# Patient Record
Sex: Female | Born: 1970 | Race: Black or African American | Hispanic: No | Marital: Married | Smoking: Never smoker
Health system: Southern US, Community
[De-identification: ages and names within clinical notes are randomized; demographics above are authoritative.]

## PROBLEM LIST (undated history)

## (undated) DIAGNOSIS — I10 Essential (primary) hypertension: Secondary | ICD-10-CM

## (undated) DIAGNOSIS — T783XXA Angioneurotic edema, initial encounter: Secondary | ICD-10-CM

## (undated) DIAGNOSIS — M545 Low back pain, unspecified: Secondary | ICD-10-CM

## (undated) DIAGNOSIS — L509 Urticaria, unspecified: Secondary | ICD-10-CM

## (undated) DIAGNOSIS — M1990 Unspecified osteoarthritis, unspecified site: Secondary | ICD-10-CM

## (undated) DIAGNOSIS — G5600 Carpal tunnel syndrome, unspecified upper limb: Secondary | ICD-10-CM

## (undated) DIAGNOSIS — Z87440 Personal history of urinary (tract) infections: Secondary | ICD-10-CM

## (undated) DIAGNOSIS — K219 Gastro-esophageal reflux disease without esophagitis: Secondary | ICD-10-CM

## (undated) DIAGNOSIS — E669 Obesity, unspecified: Secondary | ICD-10-CM

## (undated) HISTORY — DX: Obesity, unspecified: E66.9

## (undated) HISTORY — PX: CHOLECYSTECTOMY: SHX55

## (undated) HISTORY — DX: Unspecified osteoarthritis, unspecified site: M19.90

## (undated) HISTORY — DX: Low back pain, unspecified: M54.50

## (undated) HISTORY — DX: Essential (primary) hypertension: I10

## (undated) HISTORY — PX: ANTERIOR CRUCIATE LIGAMENT REPAIR: SHX115

## (undated) HISTORY — DX: Gastro-esophageal reflux disease without esophagitis: K21.9

## (undated) HISTORY — DX: Personal history of urinary (tract) infections: Z87.440

## (undated) HISTORY — DX: Urticaria, unspecified: L50.9

## (undated) NOTE — Progress Notes (Signed)
 Formatting of this note might be different from the original. Subjective : Patient ID: Leah Phillips is a 82 y.o. female who presents approximately 4 weeks s/p revision of abdominoplasty and brachioplasty scars. The patient is doing well overall, she is pleased with her early post-op result.  History of Present Illness: The patient has been using silicone based scar cream to her incisions and has been doing scar massage. Her scars are flat, she denies any scar hypertrophy or widening. She notes improvement of her arm and abdominal contour.  Objective : Physical Exam Bilateral arm incisions and abdominal incision healing well, good contour, no open areas, no scar hypertrophy.  Assessment/Plan : Leah Phillips is a 79 y.o. female who presents approximately 4 weeks s/p revision of abdominoplasty and brachioplasty scars. The patient is doing well overall, she is pleased with her early post-op result.  Continue scar massage Continue silicone based scar cream to incisions 1-2x daily Electronically signed by Dzifa Kpodzo, MD MPH at 01/04/2024 11:19 PM EDT

---
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
## 2003-09-13 ENCOUNTER — Ambulatory Visit (HOSPITAL_COMMUNITY): Admission: RE | Admit: 2003-09-13 | Discharge: 2003-09-13 | Payer: Self-pay | Admitting: Family Medicine

## 2003-09-19 ENCOUNTER — Ambulatory Visit (HOSPITAL_COMMUNITY): Admission: RE | Admit: 2003-09-19 | Discharge: 2003-09-19 | Payer: Self-pay | Admitting: Family Medicine

## 2003-09-20 ENCOUNTER — Ambulatory Visit (HOSPITAL_COMMUNITY): Admission: RE | Admit: 2003-09-20 | Discharge: 2003-09-20 | Payer: Self-pay | Admitting: Family Medicine

## 2003-09-27 ENCOUNTER — Encounter: Payer: Self-pay | Admitting: Orthopedic Surgery

## 2003-10-02 ENCOUNTER — Encounter (HOSPITAL_COMMUNITY): Admission: RE | Admit: 2003-10-02 | Discharge: 2003-11-01 | Payer: Self-pay | Admitting: Orthopedic Surgery

## 2003-11-08 ENCOUNTER — Encounter: Admission: RE | Admit: 2003-11-08 | Discharge: 2003-11-08 | Payer: Self-pay | Admitting: *Deleted

## 2004-10-20 ENCOUNTER — Ambulatory Visit: Payer: Self-pay | Admitting: Family Medicine

## 2004-12-12 ENCOUNTER — Ambulatory Visit: Payer: Self-pay | Admitting: Family Medicine

## 2005-01-29 ENCOUNTER — Ambulatory Visit: Payer: Self-pay | Admitting: Family Medicine

## 2005-02-02 ENCOUNTER — Ambulatory Visit (HOSPITAL_COMMUNITY): Admission: RE | Admit: 2005-02-02 | Discharge: 2005-02-02 | Payer: Self-pay | Admitting: Family Medicine

## 2005-02-15 ENCOUNTER — Emergency Department (HOSPITAL_COMMUNITY): Admission: EM | Admit: 2005-02-15 | Discharge: 2005-02-16 | Payer: Self-pay | Admitting: *Deleted

## 2005-02-23 ENCOUNTER — Ambulatory Visit: Payer: Self-pay | Admitting: Family Medicine

## 2005-02-27 ENCOUNTER — Ambulatory Visit (HOSPITAL_COMMUNITY): Admission: RE | Admit: 2005-02-27 | Discharge: 2005-02-27 | Payer: Self-pay | Admitting: Family Medicine

## 2005-03-03 ENCOUNTER — Encounter (HOSPITAL_COMMUNITY): Admission: RE | Admit: 2005-03-03 | Discharge: 2005-03-03 | Payer: Self-pay | Admitting: Family Medicine

## 2005-03-31 ENCOUNTER — Emergency Department (HOSPITAL_COMMUNITY): Admission: EM | Admit: 2005-03-31 | Discharge: 2005-03-31 | Payer: Self-pay | Admitting: Emergency Medicine

## 2005-04-07 ENCOUNTER — Encounter (INDEPENDENT_AMBULATORY_CARE_PROVIDER_SITE_OTHER): Payer: Self-pay | Admitting: General Surgery

## 2005-04-07 ENCOUNTER — Observation Stay (HOSPITAL_COMMUNITY): Admission: RE | Admit: 2005-04-07 | Discharge: 2005-04-08 | Payer: Self-pay | Admitting: General Surgery

## 2005-05-19 ENCOUNTER — Ambulatory Visit: Payer: Self-pay | Admitting: Family Medicine

## 2005-09-17 ENCOUNTER — Ambulatory Visit: Payer: Self-pay | Admitting: Family Medicine

## 2005-10-19 ENCOUNTER — Encounter: Admission: RE | Admit: 2005-10-19 | Discharge: 2005-10-19 | Payer: Self-pay | Admitting: Family Medicine

## 2005-11-05 ENCOUNTER — Encounter: Admission: RE | Admit: 2005-11-05 | Discharge: 2005-11-05 | Payer: Self-pay | Admitting: Family Medicine

## 2006-03-01 ENCOUNTER — Ambulatory Visit: Payer: Self-pay | Admitting: Family Medicine

## 2006-03-02 ENCOUNTER — Ambulatory Visit (HOSPITAL_COMMUNITY): Admission: RE | Admit: 2006-03-02 | Discharge: 2006-03-02 | Payer: Self-pay | Admitting: Family Medicine

## 2006-04-16 ENCOUNTER — Ambulatory Visit: Payer: Self-pay | Admitting: Family Medicine

## 2006-07-01 ENCOUNTER — Ambulatory Visit: Payer: Self-pay | Admitting: Family Medicine

## 2006-07-02 ENCOUNTER — Encounter: Payer: Self-pay | Admitting: Family Medicine

## 2006-07-02 LAB — CONVERTED CEMR LAB
Candida species: NEGATIVE
Gardnerella vaginalis: NEGATIVE

## 2006-08-30 ENCOUNTER — Encounter: Payer: Self-pay | Admitting: Family Medicine

## 2006-08-30 LAB — CONVERTED CEMR LAB
Cholesterol: 158 mg/dL (ref 0–200)
Total CHOL/HDL Ratio: 4.6
Triglycerides: 211 mg/dL — ABNORMAL HIGH (ref <150)

## 2006-10-01 ENCOUNTER — Ambulatory Visit: Payer: Self-pay | Admitting: Family Medicine

## 2006-11-19 ENCOUNTER — Ambulatory Visit: Payer: Self-pay | Admitting: Family Medicine

## 2006-11-20 ENCOUNTER — Encounter: Payer: Self-pay | Admitting: Family Medicine

## 2006-11-20 LAB — CONVERTED CEMR LAB
Chlamydia, DNA Probe: NEGATIVE
GC Probe Amp, Genital: NEGATIVE
Gardnerella vaginalis: NEGATIVE
Trichomonal Vaginitis: NEGATIVE

## 2007-01-20 ENCOUNTER — Emergency Department (HOSPITAL_COMMUNITY): Admission: EM | Admit: 2007-01-20 | Discharge: 2007-01-20 | Payer: Self-pay | Admitting: Emergency Medicine

## 2007-01-26 ENCOUNTER — Ambulatory Visit: Payer: Self-pay | Admitting: Orthopedic Surgery

## 2007-01-31 ENCOUNTER — Ambulatory Visit: Payer: Self-pay | Admitting: Family Medicine

## 2007-02-02 ENCOUNTER — Encounter: Payer: Self-pay | Admitting: Family Medicine

## 2007-02-11 ENCOUNTER — Ambulatory Visit: Payer: Self-pay | Admitting: Family Medicine

## 2007-03-23 ENCOUNTER — Ambulatory Visit: Payer: Self-pay | Admitting: Family Medicine

## 2007-03-23 LAB — CONVERTED CEMR LAB
BUN: 10 mg/dL (ref 6–23)
Basophils Relative: 0 % (ref 0–1)
Chloride: 111 meq/L (ref 96–112)
Cholesterol: 188 mg/dL (ref 0–200)
Creatinine, Ser: 0.75 mg/dL (ref 0.40–1.20)
Eosinophils Absolute: 0 10*3/uL (ref 0.0–0.7)
Eosinophils Relative: 1 % (ref 0–5)
HCT: 42.2 % (ref 36.0–46.0)
Hemoglobin: 14.3 g/dL (ref 12.0–15.0)
LDL Cholesterol: 123 mg/dL — ABNORMAL HIGH (ref 0–99)
MCHC: 33.9 g/dL (ref 30.0–36.0)
MCV: 88.7 fL (ref 78.0–100.0)
Monocytes Absolute: 0.4 10*3/uL (ref 0.2–0.7)
Monocytes Relative: 6 % (ref 3–11)
RBC: 4.76 M/uL (ref 3.87–5.11)
Triglycerides: 103 mg/dL (ref <150)

## 2007-04-08 ENCOUNTER — Encounter: Payer: Self-pay | Admitting: Orthopedic Surgery

## 2007-04-21 ENCOUNTER — Ambulatory Visit: Payer: Self-pay | Admitting: Orthopedic Surgery

## 2007-04-26 ENCOUNTER — Encounter: Payer: Self-pay | Admitting: Orthopedic Surgery

## 2007-05-02 ENCOUNTER — Encounter: Payer: Self-pay | Admitting: Orthopedic Surgery

## 2007-05-16 ENCOUNTER — Encounter: Payer: Self-pay | Admitting: Family Medicine

## 2007-05-16 ENCOUNTER — Other Ambulatory Visit: Admission: RE | Admit: 2007-05-16 | Discharge: 2007-05-16 | Payer: Self-pay | Admitting: Family Medicine

## 2007-05-16 ENCOUNTER — Ambulatory Visit: Payer: Self-pay | Admitting: Family Medicine

## 2007-05-17 ENCOUNTER — Encounter: Payer: Self-pay | Admitting: Family Medicine

## 2007-05-17 LAB — CONVERTED CEMR LAB
Gardnerella vaginalis: NEGATIVE
Trichomonal Vaginitis: NEGATIVE

## 2007-06-09 ENCOUNTER — Encounter: Payer: Self-pay | Admitting: Family Medicine

## 2007-06-15 ENCOUNTER — Ambulatory Visit: Payer: Self-pay | Admitting: Family Medicine

## 2007-06-16 ENCOUNTER — Encounter: Payer: Self-pay | Admitting: Family Medicine

## 2007-06-16 LAB — CONVERTED CEMR LAB
Candida species: NEGATIVE
GC Probe Amp, Genital: NEGATIVE

## 2007-06-23 ENCOUNTER — Ambulatory Visit: Payer: Self-pay | Admitting: Orthopedic Surgery

## 2007-06-23 DIAGNOSIS — M238X9 Other internal derangements of unspecified knee: Secondary | ICD-10-CM

## 2007-06-23 DIAGNOSIS — S83509A Sprain of unspecified cruciate ligament of unspecified knee, initial encounter: Secondary | ICD-10-CM | POA: Insufficient documentation

## 2007-07-01 ENCOUNTER — Ambulatory Visit: Payer: Self-pay | Admitting: Orthopedic Surgery

## 2007-07-01 ENCOUNTER — Ambulatory Visit (HOSPITAL_COMMUNITY): Admission: RE | Admit: 2007-07-01 | Discharge: 2007-07-01 | Payer: Self-pay | Admitting: Orthopedic Surgery

## 2007-07-02 ENCOUNTER — Emergency Department (HOSPITAL_COMMUNITY): Admission: EM | Admit: 2007-07-02 | Discharge: 2007-07-02 | Payer: Self-pay | Admitting: Emergency Medicine

## 2007-07-06 ENCOUNTER — Ambulatory Visit: Payer: Self-pay | Admitting: Orthopedic Surgery

## 2007-07-08 ENCOUNTER — Encounter: Payer: Self-pay | Admitting: Orthopedic Surgery

## 2007-07-11 ENCOUNTER — Encounter: Payer: Self-pay | Admitting: Orthopedic Surgery

## 2007-07-13 ENCOUNTER — Ambulatory Visit: Payer: Self-pay | Admitting: Orthopedic Surgery

## 2007-08-01 ENCOUNTER — Ambulatory Visit: Payer: Self-pay | Admitting: Family Medicine

## 2007-08-10 ENCOUNTER — Ambulatory Visit: Payer: Self-pay | Admitting: Orthopedic Surgery

## 2007-08-17 ENCOUNTER — Encounter: Payer: Self-pay | Admitting: Orthopedic Surgery

## 2007-09-07 ENCOUNTER — Ambulatory Visit: Payer: Self-pay | Admitting: Family Medicine

## 2007-09-19 ENCOUNTER — Encounter: Payer: Self-pay | Admitting: Orthopedic Surgery

## 2007-09-20 ENCOUNTER — Ambulatory Visit: Payer: Self-pay | Admitting: Orthopedic Surgery

## 2007-10-12 ENCOUNTER — Emergency Department (HOSPITAL_COMMUNITY): Admission: EM | Admit: 2007-10-12 | Discharge: 2007-10-12 | Payer: Self-pay | Admitting: Emergency Medicine

## 2007-10-14 ENCOUNTER — Encounter: Payer: Self-pay | Admitting: Orthopedic Surgery

## 2007-10-18 ENCOUNTER — Ambulatory Visit: Payer: Self-pay | Admitting: Family Medicine

## 2007-10-19 ENCOUNTER — Encounter: Payer: Self-pay | Admitting: Family Medicine

## 2007-10-20 ENCOUNTER — Encounter: Payer: Self-pay | Admitting: Family Medicine

## 2007-10-20 LAB — CONVERTED CEMR LAB
Candida species: NEGATIVE
Trichomonal Vaginitis: NEGATIVE

## 2007-11-11 DIAGNOSIS — J301 Allergic rhinitis due to pollen: Secondary | ICD-10-CM

## 2007-11-11 DIAGNOSIS — E669 Obesity, unspecified: Secondary | ICD-10-CM | POA: Insufficient documentation

## 2007-11-30 ENCOUNTER — Ambulatory Visit: Payer: Self-pay | Admitting: Family Medicine

## 2008-01-04 ENCOUNTER — Encounter: Payer: Self-pay | Admitting: Family Medicine

## 2008-01-04 ENCOUNTER — Ambulatory Visit: Payer: Self-pay | Admitting: Family Medicine

## 2008-01-04 DIAGNOSIS — N949 Unspecified condition associated with female genital organs and menstrual cycle: Secondary | ICD-10-CM

## 2008-01-04 DIAGNOSIS — N925 Other specified irregular menstruation: Secondary | ICD-10-CM | POA: Insufficient documentation

## 2008-01-04 DIAGNOSIS — N938 Other specified abnormal uterine and vaginal bleeding: Secondary | ICD-10-CM | POA: Insufficient documentation

## 2008-01-04 DIAGNOSIS — N760 Acute vaginitis: Secondary | ICD-10-CM | POA: Insufficient documentation

## 2008-01-05 ENCOUNTER — Encounter: Payer: Self-pay | Admitting: Family Medicine

## 2008-01-05 LAB — CONVERTED CEMR LAB: Trichomonal Vaginitis: NEGATIVE

## 2008-01-09 ENCOUNTER — Telehealth: Payer: Self-pay | Admitting: Family Medicine

## 2008-01-19 ENCOUNTER — Telehealth: Payer: Self-pay | Admitting: Family Medicine

## 2008-02-17 ENCOUNTER — Telehealth: Payer: Self-pay | Admitting: Family Medicine

## 2008-02-22 ENCOUNTER — Ambulatory Visit: Payer: Self-pay | Admitting: Family Medicine

## 2008-05-15 ENCOUNTER — Ambulatory Visit: Payer: Self-pay | Admitting: Family Medicine

## 2008-05-31 ENCOUNTER — Emergency Department (HOSPITAL_COMMUNITY): Admission: EM | Admit: 2008-05-31 | Discharge: 2008-05-31 | Payer: Self-pay | Admitting: Emergency Medicine

## 2008-06-11 ENCOUNTER — Telehealth: Payer: Self-pay | Admitting: Family Medicine

## 2008-07-24 ENCOUNTER — Ambulatory Visit: Payer: Self-pay | Admitting: Family Medicine

## 2008-07-25 ENCOUNTER — Encounter: Payer: Self-pay | Admitting: Family Medicine

## 2008-07-25 LAB — CONVERTED CEMR LAB
Candida species: NEGATIVE
Chlamydia, DNA Probe: NEGATIVE
GC Probe Amp, Genital: NEGATIVE
Gardnerella vaginalis: POSITIVE — AB
Trichomonal Vaginitis: NEGATIVE

## 2008-07-29 DIAGNOSIS — L738 Other specified follicular disorders: Secondary | ICD-10-CM | POA: Insufficient documentation

## 2008-09-21 ENCOUNTER — Emergency Department (HOSPITAL_COMMUNITY): Admission: EM | Admit: 2008-09-21 | Discharge: 2008-09-21 | Payer: Self-pay | Admitting: Emergency Medicine

## 2008-09-27 ENCOUNTER — Ambulatory Visit: Payer: Self-pay | Admitting: Family Medicine

## 2008-09-27 DIAGNOSIS — M25569 Pain in unspecified knee: Secondary | ICD-10-CM | POA: Insufficient documentation

## 2008-10-02 ENCOUNTER — Telehealth: Payer: Self-pay | Admitting: Family Medicine

## 2008-10-02 DIAGNOSIS — I10 Essential (primary) hypertension: Secondary | ICD-10-CM | POA: Insufficient documentation

## 2008-11-29 ENCOUNTER — Ambulatory Visit: Payer: Self-pay | Admitting: Family Medicine

## 2008-11-29 ENCOUNTER — Other Ambulatory Visit: Admission: RE | Admit: 2008-11-29 | Discharge: 2008-11-29 | Payer: Self-pay | Admitting: Family Medicine

## 2008-11-29 ENCOUNTER — Encounter: Payer: Self-pay | Admitting: Family Medicine

## 2008-11-29 LAB — CONVERTED CEMR LAB
Basophils Absolute: 0 10*3/uL (ref 0.0–0.1)
Calcium: 9.2 mg/dL (ref 8.4–10.5)
Cholesterol: 189 mg/dL (ref 0–200)
Hemoglobin: 14.2 g/dL (ref 12.0–15.0)
Lymphocytes Relative: 38 % (ref 12–46)
Neutro Abs: 4.1 10*3/uL (ref 1.7–7.7)
Neutrophils Relative %: 55 % (ref 43–77)
Platelets: 326 10*3/uL (ref 150–400)
Potassium: 3.6 meq/L (ref 3.5–5.3)
RDW: 12.9 % (ref 11.5–15.5)
Sodium: 143 meq/L (ref 135–145)
Total CHOL/HDL Ratio: 4.4
Triglycerides: 76 mg/dL (ref <150)
VLDL: 15 mg/dL (ref 0–40)

## 2008-11-30 ENCOUNTER — Encounter: Payer: Self-pay | Admitting: Family Medicine

## 2008-11-30 LAB — CONVERTED CEMR LAB
Gardnerella vaginalis: POSITIVE — AB
Trichomonal Vaginitis: NEGATIVE

## 2008-12-03 ENCOUNTER — Encounter: Payer: Self-pay | Admitting: Family Medicine

## 2008-12-03 LAB — CONVERTED CEMR LAB: GC Probe Amp, Genital: NEGATIVE

## 2009-02-07 ENCOUNTER — Telehealth: Payer: Self-pay | Admitting: Family Medicine

## 2009-02-21 ENCOUNTER — Ambulatory Visit: Payer: Self-pay | Admitting: Family Medicine

## 2009-02-21 DIAGNOSIS — M549 Dorsalgia, unspecified: Secondary | ICD-10-CM | POA: Insufficient documentation

## 2009-02-26 ENCOUNTER — Telehealth: Payer: Self-pay | Admitting: Family Medicine

## 2009-03-27 ENCOUNTER — Telehealth: Payer: Self-pay | Admitting: Family Medicine

## 2009-04-14 ENCOUNTER — Emergency Department (HOSPITAL_COMMUNITY): Admission: EM | Admit: 2009-04-14 | Discharge: 2009-04-14 | Payer: Self-pay | Admitting: Emergency Medicine

## 2009-04-18 ENCOUNTER — Encounter: Payer: Self-pay | Admitting: Family Medicine

## 2009-04-18 ENCOUNTER — Ambulatory Visit: Payer: Self-pay | Admitting: Cardiology

## 2009-04-19 ENCOUNTER — Encounter: Payer: Self-pay | Admitting: Family Medicine

## 2009-05-06 ENCOUNTER — Ambulatory Visit: Payer: Self-pay | Admitting: Family Medicine

## 2009-05-06 DIAGNOSIS — M542 Cervicalgia: Secondary | ICD-10-CM

## 2009-05-07 ENCOUNTER — Telehealth: Payer: Self-pay | Admitting: Family Medicine

## 2009-05-08 ENCOUNTER — Encounter: Payer: Self-pay | Admitting: Family Medicine

## 2009-05-28 ENCOUNTER — Telehealth: Payer: Self-pay | Admitting: Family Medicine

## 2009-06-10 ENCOUNTER — Telehealth: Payer: Self-pay | Admitting: Family Medicine

## 2009-06-11 ENCOUNTER — Ambulatory Visit (HOSPITAL_COMMUNITY): Admission: RE | Admit: 2009-06-11 | Discharge: 2009-06-11 | Payer: Self-pay | Admitting: Family Medicine

## 2009-06-12 ENCOUNTER — Telehealth: Payer: Self-pay | Admitting: Family Medicine

## 2009-06-17 ENCOUNTER — Telehealth: Payer: Self-pay | Admitting: Family Medicine

## 2009-06-20 ENCOUNTER — Ambulatory Visit: Payer: Self-pay | Admitting: Family Medicine

## 2009-06-21 DIAGNOSIS — M25519 Pain in unspecified shoulder: Secondary | ICD-10-CM | POA: Insufficient documentation

## 2009-06-25 ENCOUNTER — Telehealth: Payer: Self-pay | Admitting: Family Medicine

## 2009-07-10 ENCOUNTER — Telehealth: Payer: Self-pay | Admitting: Family Medicine

## 2009-07-23 ENCOUNTER — Ambulatory Visit: Payer: Self-pay | Admitting: Family Medicine

## 2009-07-23 DIAGNOSIS — R5381 Other malaise: Secondary | ICD-10-CM | POA: Insufficient documentation

## 2009-07-23 DIAGNOSIS — R5383 Other fatigue: Secondary | ICD-10-CM

## 2009-08-19 ENCOUNTER — Telehealth: Payer: Self-pay | Admitting: Family Medicine

## 2009-09-20 ENCOUNTER — Encounter: Payer: Self-pay | Admitting: Family Medicine

## 2009-09-24 LAB — CONVERTED CEMR LAB
CO2: 22 meq/L (ref 19–32)
Calcium: 9.2 mg/dL (ref 8.4–10.5)
Chloride: 106 meq/L (ref 96–112)
HDL: 42 mg/dL (ref >39)
Sodium: 141 meq/L (ref 135–145)
Total CHOL/HDL Ratio: 5
Triglycerides: 138 mg/dL (ref <150)

## 2009-09-26 ENCOUNTER — Ambulatory Visit: Payer: Self-pay | Admitting: Family Medicine

## 2009-10-07 ENCOUNTER — Telehealth: Payer: Self-pay | Admitting: Family Medicine

## 2009-10-15 ENCOUNTER — Ambulatory Visit: Payer: Self-pay | Admitting: Family Medicine

## 2009-10-15 DIAGNOSIS — L299 Pruritus, unspecified: Secondary | ICD-10-CM | POA: Insufficient documentation

## 2009-10-18 ENCOUNTER — Telehealth: Payer: Self-pay | Admitting: Family Medicine

## 2009-10-18 ENCOUNTER — Telehealth: Payer: Self-pay | Admitting: Physician Assistant

## 2009-10-31 ENCOUNTER — Encounter: Payer: Self-pay | Admitting: Family Medicine

## 2009-11-06 ENCOUNTER — Encounter: Payer: Self-pay | Admitting: Family Medicine

## 2009-11-11 ENCOUNTER — Telehealth: Payer: Self-pay | Admitting: Family Medicine

## 2009-11-12 ENCOUNTER — Encounter: Payer: Self-pay | Admitting: Family Medicine

## 2009-11-26 ENCOUNTER — Telehealth: Payer: Self-pay | Admitting: Family Medicine

## 2009-11-26 ENCOUNTER — Encounter: Payer: Self-pay | Admitting: Family Medicine

## 2010-01-04 ENCOUNTER — Emergency Department (HOSPITAL_COMMUNITY): Admission: EM | Admit: 2010-01-04 | Discharge: 2010-01-04 | Payer: Self-pay | Admitting: Emergency Medicine

## 2010-06-29 ENCOUNTER — Encounter: Payer: Self-pay | Admitting: Family Medicine

## 2010-06-30 ENCOUNTER — Encounter: Payer: Self-pay | Admitting: Family Medicine

## 2010-07-10 NOTE — Progress Notes (Signed)
Summary: referral  Phone Note Call from Patient   Summary of Call: pt has been scheduled for MRI for t and L and c spine. appt at [facility] for 06/11/2009 11:30. left message for pt to call office if nay questions.  Initial call taken by: Rudene Anda,  June 10, 2009 3:44 PM

## 2010-07-10 NOTE — Letter (Signed)
Summary: history and physical  history and physical   Imported By: Curtis Sites 11/13/2009 13:31:09  _____________________________________________________________________  External Attachment:    Type:   Image     Comment:   External Document

## 2010-07-10 NOTE — Progress Notes (Signed)
Summary: rx  Phone Note Call from Patient   Summary of Call: pharm still doesn't have right medicine [facility] [place] [phone] Initial call taken by: Rudene Anda,  August 19, 2009 9:48 AM  Follow-up for Phone Call        Phone Call Completed, Rx Called In Follow-up by: Adella Hare LPN,  August 19, 2009 10:24 AM

## 2010-07-10 NOTE — Letter (Signed)
Summary: TO PHARM.  TO PHARM.   Imported By: Lind Guest 11/06/2009 15:33:07  _____________________________________________________________________  External Attachment:    Type:   Image     Comment:   External Document

## 2010-07-10 NOTE — Progress Notes (Signed)
  Phone Note Outgoing Call   Call placed by: dr simpson Summary of Call: msg left for pt tpo call. letter will be mailed in am re her d/c Initial call taken by: Syliva Overman MD,  November 11, 2009 3:52 PM  Follow-up for Phone Call        pls typed/c letter, I will sign once done she has been duplicating pain meds  Follow-up by: Syliva Overman MD,  November 11, 2009 3:52 PM  Additional Follow-up for Phone Call Additional follow up Details #1::        Letter typed and on your bookshelf Additional Follow-up by: Everitt Amber LPN,  November 13, 5619 12:52 PM

## 2010-07-10 NOTE — Progress Notes (Signed)
Summary:  letter  Phone Note Call from Patient   Summary of Call: been out of town 3 wks. she calling about the letter she [facility]  and has not lived in [place] for years  [facility] nd told [facility] ready to pick up she goes to [facility] in [place]. this is a nurses error they should be at fault . please call back at [phone] Initial call taken by: Lind Guest,  November 26, 2009 8:00 AM  Follow-up for Phone Call        i had to lv her a msg to call get me to the phone if she calls pls Follow-up by: Syliva Overman MD,  November 27, 2009 4:55 PM  Additional Follow-up for Phone Call Additional follow up Details #1::        pt hasn't called pre doc  Additional Follow-up by: Rudene Anda,  December 02, 2009 4:55 PM

## 2010-07-10 NOTE — Progress Notes (Signed)
Summary: ITCHY POLLEN  Phone Note Call from Patient   Summary of Call: SHE HAS BEEN ITCHING  BECAUSE OF THE POLLEN  BREAKING OUT IN [place] AND HAS TRIED THE ALL AND ZYTREC AND BEN DOES HELP BUT SHE TAKES 1 A DAY AND IT MAKES HER SLEEPY WANTS SOMETHINF ELSE  [facility] [place] CALL HER BACK AT [phone] TO LET HER KNOW Initial call taken by: Lind Guest,  Oct 07, 2009 9:12 AM  Follow-up for Phone Call        this patient was just here 4/21 Follow-up by: Adella Hare LPN,  Oct 07, 4401 9:15 AM  Additional Follow-up for Phone Call Additional follow up Details #1::        advise and erx prednisone 5mg  dosepack x 5 dayspls Additional Follow-up by: Syliva Overman MD,  Oct 07, 2009 10:00 AM    Additional Follow-up for Phone Call Additional follow up Details #2::    rx sent, patient aware Follow-up by: Adella Hare LPN,  Oct 07, 4740 10:06 AM  New/Updated Medications: PREDNISONE (PAK) 5 MG TABS (PREDNISONE) uad Prescriptions: PREDNISONE (PAK) 5 MG TABS (PREDNISONE) uad  #1 x 0   Entered by:   Adella Hare LPN   Authorized by:   Syliva Overman MD   Signed by:   Adella Hare LPN on 59/56/3875   Method used:   Electronically to        [facility] (ret[facility])       [address]       [place], [place]  [address]       Ph: [phone]       Fax: [phone]   RxID:   [id number]   Appended Document: ITCHY POLLEN The prednisone pak didn't help at all and she is still breaking out in hives and her nose is burning on the inside. Do you want to see her back or call her something else?  Appended Document: ITCHY POLLEN 1) Pt may come in for a nurse visit for Depo Medrol 80mg . 2) She needs to be taking an antihistamine daily.  I saw that she was taking Zyrtec but was making her drowsy.  I would recommend Loratadine or Allegra.  These should not make her drowsy. 3) Rx Rantidine 150 mg two times a day #30 no rf.  Even though this is a stomach pill it helps with  hives & itching. 4) If hives & itching persist will need to make an appt.  May need a referral to an allergist.  Appended Document: ITCHY POLLEN called patient, left message  Appended Document: ITCHY POLLEN    Clinical Lists Changes  Medications: Added new medication of RANITIDINE HCL 150 MG TABS (RANITIDINE HCL) Take 1 tablet by mouth two times a day - Signed Rx of RANITIDINE HCL 150 MG TABS (RANITIDINE HCL) Take 1 tablet by mouth two times a day;  #30 x 0;  Signed;  Entered by: Everitt Amber LPN;  Authorized by: Syliva Overman MD;  Method used: Electronically to [facility] [address] [place], [place], [place]  [address], Ph: [phone], Fax: [phone]    Prescriptions: RANITIDINE HCL 150 MG TABS (RANITIDINE HCL) Take 1 tablet by mouth two times a day  #30 x 0   Entered by:   Everitt Amber LPN   Authorized by:   Syliva Overman MD   Signed by:   Everitt Amber LPN on  10/14/2009   Method used:   Electronically to        [facility] (retail)       [address]       [place], [place]  [address]       Ph: [phone]       Fax: [phone]   RxID:   [id number]    Appended Document: ITCHY POLLEN pt scheduled to come in for depo medrol injection and med sent to [facility]

## 2010-07-10 NOTE — Progress Notes (Signed)
Summary: medicine  Phone Note Call from Patient   Summary of Call: [facility] [place] and leave message  to let her know that it is ready  call at [phone] Initial call taken by: Lind Guest,  July 10, 2009 8:58 AM  Follow-up for Phone Call        returned call, no answer Follow-up by: Worthy Keeler LPN,  July 10, 2009 9:03 AM    Prescriptions: SEASONALE 0.15-0.03 MG TABS (LEVONORGEST-ETH ESTRAD 91-DAY) Take 1 tablet by mouth once a day  #90 x 0   Entered by:   Worthy Keeler LPN   Authorized by:   Syliva Overman MD   Signed by:   Worthy Keeler LPN on 06/16/3233   Method used:   Electronically to        [facility] (retail)       [address]       [place], [place]  [address]       Ph: [phone]       Fax: [phone]   RxID:   [id number]

## 2010-07-10 NOTE — Progress Notes (Signed)
Summary: RX  Phone Note Call from Patient   Summary of Call: DRUG STORE HAS SENT FAX OVER FOR HER PAIN MEDS AND NO ONE HERE HAS RESPONDED [phone] Initial call taken by: Lind Guest,  June 25, 2009 9:58 AM  Follow-up for Phone Call        advised was sent in yesterday, check back with pharmacy Follow-up by: Worthy Keeler LPN,  June 25, 2009 1:58 PM

## 2010-07-10 NOTE — Assessment & Plan Note (Signed)
Summary: office visit   Vital Signs:  Patient profile:   40 year old female Menstrual status:  irregular Height:      63 inches Weight:      221 pounds BMI:     39.29 O2 Sat:      98 % Pulse rate:   79 / minute Pulse rhythm:   regular Resp:     16 per minute BP sitting:   110 / 80 Cuff size:   xl  Vitals Entered By: Everitt Amber (July 23, 2009 3:01 PM)  Nutrition Counseling: Patient's BMI is greater than 25 and therefore counseled on weight management options. CC: Follow up chronic problems Is Patient Diabetic? No   CC:  Follow up chronic problems.  History of Present Illness: Reports  that she has been  doing well. Denies recent fever or chills. Denies sinus pressure, nasal congestion , ear pain or sore throat. Denies chest congestion, or cough productive of sputum. Denies chest pain, palpitations, PND, orthopnea or leg swelling. Denies abdominal pain, nausea, vomitting, diarrhea or constipation. Denies change in bowel movements or bloody stool. Denies dysuria , frequency, incontinence or hesitancy. chronic back and knee pain Denies headaches, vertigo, seizures. Denies depression, anxiety or insomnia.Less stressed and depressed though the problem with her son is not yet resolved. Denies  rash, lesions, or itch. she has lost 10 poounds in the past 6 weeks on phentermine, is excited bout this and wants to continue.[name] also exercising.     Allergies: No Known Drug Allergies  Review of Systems      See HPI Eyes:  Denies blurring and discharge. Endo:  Denies cold intolerance, excessive hunger, excessive thirst, excessive urination, heat intolerance, polyuria, and weight change. Heme:  Denies abnormal bruising and bleeding. Allergy:  Denies hives or rash and itching eyes.  Physical Exam  General:  Well-developed,well-nourished,in no acute distress; alert,appropriate and cooperative throughout examination HEENT: No facial asymmetry,  EOMI, No sinus tenderness,  TM's Clear, oropharynx  pink and moist.   Chest: Clear to auscultation bilaterally.  CVS: S1, S2, No murmurs, No S3.   Abd: Soft, Nontender.  MS: Adequate ROM spine, hips, shoulders and knees.  Ext: No edema.   CNS: CN 2-12 intact, power tone and sensation normal throughout.   Skin: Intact, no visible lesions or rashes.  Psych: Good eye contact, normal affect.  Memory intact, not anxious or depressed appearing.    Impression & Recommendations:  Problem # 1:  BACK PAIN (ICD-724.5) Assessment Unchanged  The following medications were removed from the medication list:    Ibuprofen 800 Mg Tabs (Ibuprofen) .Marland Kitchen... Take 1 tablet by mouth two times a day Her updated medication list for this problem includes:    Hydrocodone-acetaminophen 10-650 Mg Tabs (Hydrocodone-acetaminophen) .Marland Kitchen... Take 1 tablet by mouth two times a day as needed  Problem # 2:  HYPERTENSION (ICD-401.9) Assessment: Unchanged  Her updated medication list for this problem includes:    Maxzide-25 37.5-25 Mg Tabs (Triamterene-hctz) ..... One tab by mouth once daily (takes half tab)  BP today: 110/80 Prior BP: 110/80 (06/20/2009)  Labs Reviewed: K+: 3.6 (11/29/2008) Creat: : 0.83 (11/29/2008)   Chol: 189 (11/29/2008)   HDL: 43 (11/29/2008)   LDL: 131 (11/29/2008)   TG: 76 (11/29/2008)  Problem # 3:  OBESITY, UNSPECIFIED (ICD-278.00) Assessment: Improved  Ht: 63 (07/23/2009)   Wt: 221 (07/23/2009)   BMI: 39.29 (07/23/2009)  Problem # 4:  DYSFUNCTIONAL UTERINE BLEEDING (ICD-626.8) Assessment: Improved  Complete Medication List: 1)  Maxzide-25 37.5-25 Mg Tabs (Triamterene-hctz) .... One tab by mouth once daily 2)  Phentermine Hcl 37.5 Mg Caps (Phentermine hcl) .... Take 1 tablet by mouth once a day 3)  Hydrocodone-acetaminophen 10-650 Mg Tabs (Hydrocodone-acetaminophen) .... Take 1 tablet by mouth two times a day as needed 4)  Omeprazole 20 Mg Cpdr (Omeprazole) .... Take 1 capsule by mouth once a day 5)  Tri-sprintec  0.18/0.215/0.25 Mg-35 Mcg Tabs (Norgestim-eth estrad triphasic) .... Use as directed  Other Orders: T-Basic Metabolic Panel [id number]) T-Lipid Profile [id number])  Patient Instructions: 1)  Please schedule a follow-up appointment in 2 months. 2)  It is important that you exercise regularly at least 20 minutes 5 times a week. If you develop chest pain, have severe difficulty breathing, or feel very tired , stop exercising immediately and seek medical attention. 3)  You need to lose weight. Consider a lower calorie diet and regular exercise.  4)  BMP prior to visit, ICD-9:   fasting in 2 months 5)  Lipid Panel prior to visit, ICD-9: Prescriptions: HYDROCODONE-ACETAMINOPHEN 10-650 MG TABS (HYDROCODONE-ACETAMINOPHEN) Take 1 tablet by mouth two times a day as needed  #60 x 3   Entered by:   Everitt Amber LPN   Authorized by:   Syliva Overman MD   Signed by:   Everitt Amber LPN on 29/56/2130   Method used:   Printed then faxed to ...       [facility] ([facility]       [address]       [place], [place]  [address]       Ph: [phone]       Fax: [phone]   RxID:   [id number] PHENTERMINE HCL 37.5 MG CAPS (PHENTERMINE HCL) Take 1 tablet by mouth once a day  #30 x 1   Entered by:   Everitt Amber LPN   Authorized by:   Syliva Overman MD   Signed by:   Everitt Amber LPN on 63/87/5643   Method used:   Printed then faxed to ...       [facility] ([facility])       [address]       [place], [place]  [address]       Ph: [phone]       Fax: [phone]   RxID:   [id number]

## 2010-07-10 NOTE — Assessment & Plan Note (Signed)
Summary: nurse visit  Nurse Visit   Vital Signs:  Patient profile:   40 year old female Menstrual status:  irregular Height:      63 inches Weight:      224.38 pounds BMI:     39.89 O2 Sat:      98 % Pulse rate:   94 / minute Resp:     16 per minute  Vitals Entered By: Everitt Amber LPN (Oct 15, 2009 11:06 AM) CC: Patient to come in per [facility] for itching for depo medrol 80mg     Allergies: No Known Drug Allergies  Medication Administration  Injection # 1:    Medication: Depo- Medrol 80mg     Diagnosis: PRURITUS (ICD-698.9)    Route: IM    Site: RUOQ gluteus    Exp Date: 06/2010    Lot #: [id number]    Mfr: Pharmacia    Comments: 80mg  given     Patient tolerated injection without complications    Given by: Everitt Amber LPN (Oct 15, 2009 11:16 AM)  Orders Added: 1)  Depo- Medrol 80mg  [J1040] 2)  Admin of Therapeutic Inj  intramuscular or subcutaneous [[id number]]   Medication Administration  Injection # 1:    Medication: Depo- Medrol 80mg     Diagnosis: PRURITUS (ICD-698.9)    Route: IM    Site: RUOQ gluteus    Exp Date: 06/2010    Lot #: [id number]    Mfr: Pharmacia    Comments: 80mg  given     Patient tolerated injection without complications    Given by: Everitt Amber LPN (Oct 15, 2009 11:16 AM)  Orders Added: 1)  Depo- Medrol 80mg  [J1040] 2)  Admin of Therapeutic Inj  intramuscular or subcutaneous [[id number]]

## 2010-07-10 NOTE — Letter (Signed)
Summary: phone notes  phone notes   Imported By: Curtis Sites 11/13/2009 13:32:08  _____________________________________________________________________  External Attachment:    Type:   Image     Comment:   External Document

## 2010-07-10 NOTE — Letter (Signed)
Summary: xray  xray   Imported By: Curtis Sites 11/13/2009 13:32:42  _____________________________________________________________________  External Attachment:    Type:   Image     Comment:   External Document

## 2010-07-10 NOTE — Assessment & Plan Note (Signed)
Summary: shot per nurse  Nurse Visit   Vital Signs:  Patient profile:   40 year old female Menstrual status:  irregular Height:      63 inches Weight:      231.75 pounds Pulse rate:   101 / minute BP sitting:   110 / 80  (left arm)  Vitals Entered By: Worthy Keeler LPN (June 21, 2009 9:02 AM) Comments right shoulder pain   Allergies: No Known Drug Allergies  Medication Administration  Injection # 1:    Medication: Depo- Medrol 80mg     Diagnosis: SHOULDER PAIN, RIGHT (ICD-719.41)    Route: IM    Site: RUOQ gluteus    Exp Date: 10/11    Lot #: [id number]    Mfr: Pharmacia    Patient tolerated injection without complications    Given by: Worthy Keeler LPN (June 21, 2009 9:03 AM)  Injection # 2:    Medication: Ketorolac-Toradol 15mg     Diagnosis: SHOULDER PAIN, RIGHT (ICD-719.41)    Route: IM    Site: LUOQ gluteus    Exp Date: 01/07/2011    Lot #: [id number]    Mfr: novaplus    Comments: toradol 60mg  given    Patient tolerated injection without complications    Given by: Worthy Keeler LPN (June 21, 2009 9:04 AM)  Orders Added: 1)  Depo- Medrol 80mg  [J1040] 2)  Ketorolac-Toradol 15mg  [J1885] 3)  Admin of Therapeutic Inj  intramuscular or subcutaneous [[id number]]   Medication Administration  Injection # 1:    Medication: Depo- Medrol 80mg     Diagnosis: SHOULDER PAIN, RIGHT (ICD-719.41)    Route: IM    Site: RUOQ gluteus    Exp Date: 10/11    Lot #: [id number]    Mfr: Pharmacia    Patient tolerated injection without complications    Given by: Worthy Keeler LPN (June 21, 2009 9:03 AM)  Injection # 2:    Medication: Ketorolac-Toradol 15mg     Diagnosis: SHOULDER PAIN, RIGHT (ICD-719.41)    Route: IM    Site: LUOQ gluteus    Exp Date: 01/07/2011    Lot #: [id number]    Mfr: novaplus    Comments: toradol 60mg  given    Patient tolerated injection without complications    Given by: Worthy Keeler LPN (June 21, 2009 9:04 AM)  Orders Added: 1)  Depo- Medrol 80mg   [J1040] 2)  Ketorolac-Toradol 15mg  [J1885] 3)  Admin of Therapeutic Inj  intramuscular or subcutaneous [[id number]]  noted and agree

## 2010-07-10 NOTE — Progress Notes (Signed)
Summary: PAIN  Phone Note Call from Patient   Summary of Call: IN PAIN WANTS DIFFERENT KIND OF MEDICINE CALL BACK AT [phone] Initial call taken by: Lind Guest,  June 17, 2009 9:55 AM  Follow-up for Phone Call        states pain from neck to shoulder, increased over the weekend, needs somthing stronger for pain. has hydrocodone 10/650 [facility] [place] Follow-up by: Worthy Keeler LPN,  June 17, 2009 10:31 AM  Additional Follow-up for Phone Call Additional follow up Details #1::        advise no stronfeger med available from me , offer prednisone 5mg  dose pack and/or toradol 60mg  Im and deopmedrol 80mg  IM in office Additional Follow-up by: Syliva Overman MD,  June 17, 2009 10:46 AM    Additional Follow-up for Phone Call Additional follow up Details #2::    returned call, busy signal x2 Follow-up by: Worthy Keeler LPN,  June 17, 2009 11:04 AM  Additional Follow-up for Phone Call Additional follow up Details #3:: Details for Additional Follow-up Action Taken: patient states she will call us and let us know tomorrow if she wants to come for shots Additional Follow-up by: Worthy Keeler LPN,  June 17, 2009 3:12 PM

## 2010-07-10 NOTE — Letter (Signed)
Summary: progress notes  progress notes   Imported By: Curtis Sites 11/13/2009 13:32:27  _____________________________________________________________________  External Attachment:    Type:   Image     Comment:   External Document

## 2010-07-10 NOTE — Progress Notes (Signed)
Summary: RX  Phone Note Call from Patient   Summary of Call: WANTS YOU TO CALL IN [facility] PAIN MEDICINE  SEND TO [facility] [place] Initial call taken by: Lind Guest,  Oct 18, 2009 9:57 AM  Follow-up for Phone Call        Patient aware Follow-up by: Everitt Amber LPN,  Oct 18, 2009 10:27 AM    Prescriptions: HYDROCODONE-ACETAMINOPHEN 10-650 MG TABS (HYDROCODONE-ACETAMINOPHEN) Take 1 tablet by mouth two times a day as needed  #60 x 3   Entered by:   Everitt Amber LPN   Authorized by:   Syliva Overman MD   Signed by:   Everitt Amber LPN on 81/19/1478   Method used:   Printed then faxed to ...       [facility] ([facility])       [address]       [place], [place]  [address]       Ph: [phone]       Fax: [phone]   RxID:   [id number]

## 2010-07-10 NOTE — Letter (Signed)
Summary: consults  consults   Imported By: Curtis Sites 11/13/2009 13:30:34  _____________________________________________________________________  External Attachment:    Type:   Image     Comment:   External Document

## 2010-07-10 NOTE — Letter (Signed)
Summary: CERTIFIED MAIL RECEIPT  CERTIFIED MAIL RECEIPT   Imported By: Lind Guest 11/26/2009 10:07:01  _____________________________________________________________________  External Attachment:    Type:   Image     Comment:   External Document

## 2010-07-10 NOTE — Letter (Signed)
Summary: labs  labs   Imported By: Curtis Sites 11/13/2009 13:31:26  _____________________________________________________________________  External Attachment:    Type:   Image     Comment:   External Document

## 2010-07-10 NOTE — Letter (Signed)
Summary: rx dispensed  rx dispensed   Imported By: Lind Guest 11/12/2009 08:42:58  _____________________________________________________________________  External Attachment:    Type:   Image     Comment:   External Document

## 2010-07-10 NOTE — Letter (Signed)
Summary: demographic  demographic   Imported By: Curtis Sites 11/13/2009 13:30:52  _____________________________________________________________________  External Attachment:    Type:   Image     Comment:   External Document

## 2010-07-10 NOTE — Letter (Signed)
Summary: DISCHARGE  DISCHARGE   Imported By: Lind Guest 11/12/2009 15:12:07  _____________________________________________________________________  External Attachment:    Type:   Image     Comment:   External Document

## 2010-07-10 NOTE — Progress Notes (Signed)
  Phone Note Call from Patient   Summary of Call: Called and wanted to know if you could call her in some hydroxyzine for itching.  I told her the last time she came in that you recommended a Derm. referral. Do you want to write this for her or would you prefer she go to dermatology?  [facility] [place] Initial call taken by: Everitt Amber LPN,  Oct 18, 2009 1:36 PM  Follow-up for Phone Call        sent in per Mirtie Bastyr and will remind her she needs to get referral for the dermatologist.   Called and no answer Follow-up by: Everitt Amber LPN,  Oct 18, 2009 4:36 PM  Additional Follow-up for Phone Call Additional follow up Details #1::        called no answer Additional Follow-up by: Everitt Amber LPN,  Oct 21, 2009 2:14 PM    New/Updated Medications: HYDROXYZINE HCL 25 MG TABS (HYDROXYZINE HCL) One tab by mouth every 8 hrs as needed Prescriptions: HYDROXYZINE HCL 25 MG TABS (HYDROXYZINE HCL) One tab by mouth every 8 hrs as needed  #20 x 0   Entered by:   Everitt Amber LPN   Authorized by:   Esperanza Sheets PA   Signed by:   Everitt Amber LPN on 16/03/9603   Method used:   Electronically to        [facility] ([facility])       [address]       [place], [place]  [address]       Ph: [phone]       Fax: [phone]   RxID:   [id number]

## 2010-07-10 NOTE — Assessment & Plan Note (Signed)
Summary: FUP   Vital Signs:  Patient profile:   40 year old female Menstrual status:  irregular Height:      63 inches Weight:      224 pounds BMI:     39.82 O2 Sat:      98 % Pulse rate:   83 / minute Pulse rhythm:   regular Resp:     16 per minute BP sitting:   118 / 90  (left arm) Cuff size:   xl  Vitals Entered By: Everitt Amber LPN (September 26, 2009 9:56 AM)  Nutrition Counseling: Patient's BMI is greater than 25 and therefore counseled on weight management options. CC: Follow up chronic problems   CC:  Follow up chronic problems.  History of Present Illness: Reports  that she has been   doing well. Denies recent fever or chills. Denies sinus pressure, nasal congestion , ear pain or sore throat. Denies chest congestion, or cough productive of sputum. Denies chest pain, palpitations, PND, orthopnea or leg swelling. Denies abdominal pain, nausea, vomitting, diarrhea or constipation. Denies change in bowel movements or bloody stool. Denies dysuria , frequency, incontinence or hesitancy.  Denies headaches, vertigo, seizures. Denies depression, anxiety or insomnia. Denies  rash, lesions, or itch. The pt has not been diligent in lifestyle modification for weight loss, states the phentermine does help and that she intends to modify her behavior once more.      Marland KitchenHPI1  Current Medications (verified): 1)  Maxzide-25 37.5-25 Mg  Tabs (Triamterene-Hctz) .... One Tab By Mouth Once Daily 2)  Phentermine Hcl 37.5 Mg Caps (Phentermine Hcl) .... Take 1 Tablet By Mouth Once A Day 3)  Hydrocodone-Acetaminophen 10-650 Mg Tabs (Hydrocodone-Acetaminophen) .... Take 1 Tablet By Mouth Two Times A Day As Needed 4)  Omeprazole 20 Mg Cpdr (Omeprazole) .... Take 1 Capsule By Mouth Once A Day 5)  Tri-Sprintec 0.18/0.215/0.25 Mg-35 Mcg Tabs (Norgestim-Eth Estrad Triphasic) .... Use As Directed  Allergies (verified): No Known Drug Allergies  Review of Systems      See HPI Eyes:  Denies  blurring, discharge, eye pain, and red eye. MS:  Complains of low back pain and mid back pain; chronic. Endo:  Denies cold intolerance, excessive hunger, excessive thirst, excessive urination, heat intolerance, polyuria, and weight change. Heme:  Denies abnormal bruising and bleeding. Allergy:  Denies hives or rash and itching eyes.  Physical Exam  General:  Well-developed,well-nourished,in no acute distress; alert,appropriate and cooperative throughout examination HEENT: No facial asymmetry,  EOMI, No sinus tenderness, TM's Clear, oropharynx  pink and moist.   Chest: Clear to auscultation bilaterally.  CVS: S1, S2, No murmurs, No S3.   Abd: Soft, Nontender.  MS: Adequate ROM spine, hips, shoulders and knees.  Ext: No edema.   CNS: CN 2-12 intact, power tone and sensation normal throughout.   Skin: Intact, no visible lesions or rashes.  Psych: Good eye contact, normal affect.  Memory intact, not anxious or depressed appearing.    Impression & Recommendations:  Problem # 1:  NECK PAIN (ICD-723.1) Assessment Unchanged  Her updated medication list for this problem includes:    Hydrocodone-acetaminophen 10-650 Mg Tabs (Hydrocodone-acetaminophen) .Marland Kitchen... Take 1 tablet by mouth two times a day as needed  Problem # 2:  HYPERTENSION (ICD-401.9) Assessment: Deteriorated  Her updated medication list for this problem includes:    Maxzide-25 37.5-25 Mg Tabs (Triamterene-hctz) ..... One tab by mouth once daily  BP today: 118/90 Prior BP: 110/80 (07/23/2009)  Labs Reviewed: K+: 3.8 (09/20/2009) Creat: :  0.66 (09/20/2009)   Chol: 210 (09/20/2009)   HDL: 42 (09/20/2009)   LDL: 140 (09/20/2009)   TG: 138 (09/20/2009)  Problem # 3:  OBESITY, UNSPECIFIED (ICD-278.00) Assessment: Deteriorated  Ht: 63 (09/26/2009)   Wt: 224 (09/26/2009)   BMI: 39.82 (09/26/2009)  Complete Medication List: 1)  Maxzide-25 37.5-25 Mg Tabs (Triamterene-hctz) .... One tab by mouth once daily 2)  Phentermine Hcl  37.5 Mg Caps (Phentermine hcl) .... Take 1 tablet by mouth once a day 3)  Hydrocodone-acetaminophen 10-650 Mg Tabs (Hydrocodone-acetaminophen) .... Take 1 tablet by mouth two times a day as needed 4)  Omeprazole 20 Mg Cpdr (Omeprazole) .... Take 1 capsule by mouth once a day 5)  Tri-sprintec 0.18/0.215/0.25 Mg-35 Mcg Tabs (Norgestim-eth estrad triphasic) .... Use as directed  Patient Instructions: 1)  CPE june 25 or after. 2)  It is important that you exercise regularly at least 20 minutes 5 times a week. If you develop chest pain, have severe difficulty breathing, or feel very tired , stop exercising immediately and seek medical attention. 3)  You need to lose weight. Consider a lower calorie diet and regular exercise.  4)  No med changes at this time. 5)  Pls follow a low fat diet and reduce the cheese, deserts and candy Prescriptions: TRI-SPRINTEC 0.18/0.215/0.25 MG-35 MCG TABS (NORGESTIM-ETH ESTRAD TRIPHASIC) Use as directed  #30 x 2   Entered by:   Everitt Amber LPN   Authorized by:   Syliva Overman MD   Signed by:   Everitt Amber LPN on 91/47/8295   Method used:   Printed then faxed to ...       [facility] (retail)       [address]       [place], [place]  [address]       Ph: [phone]       Fax: [phone]   RxID:   [id number] MAXZIDE-25 37.5-25 MG  TABS (TRIAMTERENE-HCTZ) one tab by mouth once daily  #30 Each x 3   Entered by:   Everitt Amber LPN   Authorized by:   Syliva Overman MD   Signed by:   Everitt Amber LPN on 42/59/5638   Method used:   Printed then faxed to ...       [facility] (retail)       [address]       [place], [place]  [address]       Ph: [phone]       Fax: [phone]   RxID:   [id number] PHENTERMINE HCL 37.5 MG CAPS (PHENTERMINE HCL) Take 1 tablet by mouth once a day  #30 x 1   Entered by:   Everitt Amber LPN   Authorized by:   Syliva Overman MD   Signed by:   Everitt Amber LPN  on 11/29/7626   Method used:   Printed then faxed to ...       [facility] (retail)       [address]       [place], [place]  [address]       Ph: [phone]       Fax: [phone]   RxID:   [id number] HYDROCODONE-ACETAMINOPHEN 10-650 MG TABS (HYDROCODONE-ACETAMINOPHEN) Take 1 tablet by mouth two times a day as needed  #60 x 3  Entered by:   Everitt Amber LPN   Authorized by:   Syliva Overman MD   Signed by:   Everitt Amber LPN on 45/40/9811   Method used:   Printed then faxed to ...       [facility] (retail)       [address]       [place], [place]  [address]       Ph: [phone]       Fax: [phone]   RxID:   [id number]

## 2010-07-10 NOTE — Progress Notes (Signed)
Summary: LEFT MESSAGE  Phone Note Call from Patient   Summary of Call: LM FOR NURSE TO CALL HER AT [phone] Initial call taken by: Lind Guest,  June 12, 2009 8:25 AM  Follow-up for Phone Call        Having symptoms of bacterial infection. Wanted to know if you would call her in antibiotic in [facility]. She said she has them on a regular basis and didn't think she would need to come in  Follow-up by: Everitt Amber,  June 12, 2009 8:47 AM  Additional Follow-up for Phone Call Additional follow up Details #1::        advise med sent in Additional Follow-up by: Syliva Overman MD,  June 12, 2009 9:17 AM    Additional Follow-up for Phone Call Additional follow up Details #2::    Left message for patient to get med at pharmacy Follow-up by: Everitt Amber,  June 12, 2009 11:00 AM  New/Updated Medications: METRONIDAZOLE 500 MG TABS (METRONIDAZOLE) Take 1 tablet by mouth two times a day Prescriptions: METRONIDAZOLE 500 MG TABS (METRONIDAZOLE) Take 1 tablet by mouth two times a day  #14 x 0   Entered and Authorized by:   Syliva Overman MD   Signed by:   Syliva Overman MD on 06/12/2009   Method used:   Electronically to        [facility] (retail)       [address]       [place], [place]  [address]       Ph: [phone]       Fax: [phone]   RxID:   [id number]

## 2010-07-10 NOTE — Letter (Signed)
Summary: misc  misc   Imported By: Curtis Sites 11/13/2009 13:31:42  _____________________________________________________________________  External Attachment:    Type:   Image     Comment:   External Document

## 2010-07-10 NOTE — Progress Notes (Signed)
  Phone Note Outgoing Call   Summary of Call: upon receiving [facility] her [facility], i looked on the [place] narcotic registry to find that this patient has been getting the med at [facility] for several months. there has been confusion in the past about which pharmacy patient uses. also one pharmacy has patient spelling name Alfonso and one spelled tosha. the pharmacy has been contacted and advised to no longer fill these meds and Dr. Lodema Hong is aware. Initial call taken by: Adella Hare LPN,  November 12, 5619 8:28 AM

## 2010-10-21 NOTE — Op Note (Signed)
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   PROCEDURE:  ACL reconstruction with allograft.   SURGEON:  Vickki Hearing, M.D. assisted by  [facility].   ANESTHETIC:  General.   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   HISTORY:  A 40 year old female tore her ACL several months back, treated  with nonoperative regimen, did not improve, continued to have giving way  episodes and presented for reconstruction.   PROCEDURE IN DETAIL:  [name] was identified in the preop holding  area, left knee marked for surgery, countersigned by the surgeon and  antibiotics given in the OR.  History and physical was updated in preop  area.  The patient taken to surgery, given general anesthetic, left leg  prepped and draped with DuraPrep in sterile technique.  Time-out  completed.   Arthroscopic surgery was performed first.  Diagnostic arthroscopy was  done through standard portals.  Intra-articular structures probed,  visualized, ACL only structure torn, patellofemoral mechanics as stated.   Tibial guide set for 50 degrees, ACL guide with 7 mm patella, PCL offset  placed in the medial femoral condyle in line with the posterior edge  lateral meniscus anterior horn.  Skin incision made over the medial face  of the tibia.  Subcu tissue divided.  Periosteum elevated.  Bullet  applied to the tibia.   Guide pin drilled into the joint, overreamed with  an 11 mm reamer, over-the-top guide passed after arthroplasty.  Guide  pin drilled out the anterolateral thigh, overreamed to 25 mm depth with  the 10 mm reamer.   Graft was prepared on the back table after 1 hour soaking.  Ten and 11  mm bone plugs and 95 mm graft was prepared and kept on the back table.  The knee was prepared for passage of the graft.  Graft was brought from  the back table, passed into the joint, secured proximally with a 7 x  22.5 mm screw, knee cycled several times, knee placed in extension,  graft sucked in very nicely, 10 x 28 screw passed.   Joint then evaluated arthroscopically, full extension and flexion noted,  tension was good on the graft, knee was irrigated, incision was closed  with zero Monocryl and 3-0 Prolene and Steri-Strips.  Injected 30 mL  Marcaine with epinephrine.  Dressings, Cryo/Cuff and brace applied.  The  patient extubated, taken to recovery room in stable condition.  Discharged on Lorcet 10/650, Phenergan and ibuprofen.      Vickki Hearing, M.D.  Electronically Signed     SEH/MEDQ  D:  07/01/2007  T:  07/02/2007  Job:  [id number]

## 2010-10-21 NOTE — H&P (Signed)
NAME:  Leah Phillips, Leah Phillips NO.:  [id number]   MEDICAL RECORD NO.:  [id number]          PATIENT TYPE:  AMB   LOCATION:  DAY                           FACILITY:  [facility]   PHYSICIAN:  Vickki Hearing, M.D.DATE OF BIRTH:  1970/11/08   DATE OF ADMISSION:  DATE OF DISCHARGE:  LH                              HISTORY & PHYSICAL   This is a 40 year old female injured her left knee in 2008.  She had an  MRI October 31 at [facility] which showed an ACL tear with a contusion.  Apparently her injuries occurred sometime in May.  She was wrestling  with her son. Someone jumped on her back.  She fell, twisted her left  knee.  Presented with occasional pain and swelling with throbbing and  aching.  She was treated with nonoperative measures including pain  medication, bracing, and physical therapy.  Did well until the last  month or two when her symptoms got worse again.   PRIOR MEDICATIONS:  None.   ALLERGIES:  No allergies.   SURGICAL HISTORY:  She has no surgical history.   PAST MEDICAL HISTORY:  No past medical history.   REVIEW OF SYSTEMS:  Negative.   PHYSICAL EXAMINATION:  VITAL SIGNS:  Stable.  GENERAL APPEARANCE:  Well-developed, well-nourished large body habitus.  Gait, heel toe normal.  SKIN:  Intact.  Inspection revealed no ecchymosis, swelling or  deformity.  NEUROLOGIC:  Findings were normal.  Muscular function was normal in all  four extremities.   Evaluation of left knee revealed normal peripheral pulses and sensation.  She had a positive Lachman, positive pivot, negative collateral ligament  tests.   She has an ACL tear which has become more symptomatic with more frequent  giving way episodes.  She now presents for arthroscopy and ACL  reconstruction with a patellar tendon allograft.   Informed consent process completed in the office.  Risks and benefits  explained.  Specific to this procedure risks and benefits include  improved knee function and  stability, possibility of bleeding, infection  discussed, possibility of retearing discussed, possibility of stiffness  discussed, importance of rehabilitation discussed as well.  The patient  agreed.  The patient to have surgery on the left knee for ACL  reconstruction.      Vickki Hearing, M.D.  Electronically Signed     SEH/MEDQ  D:  06/30/2007  T:  06/30/2007  Job:  [id number]

## 2010-10-24 NOTE — H&P (Signed)
NAME:  Leah Phillips, Leah Phillips                ACCOUNT NO.:  [id number]   MEDICAL RECORD NO.:  [id number]          PATIENT TYPE:  AMB   LOCATION:  DAY                           FACILITY:  [facility]   PHYSICIAN:  Dirk Dress. Katrinka Blazing, M.D.   DATE OF BIRTH:  23-Jun-1970   DATE OF ADMISSION:  DATE OF DISCHARGE:  LH                                HISTORY & PHYSICAL   HISTORY OF PRESENT ILLNESS:  A 40 year old female with history of recurrent  abdominal pain which is constant.  The pain is epigastric, slightly to the  left.  It radiates to her back. She had discomfort with all meals.  She has  had episodic diarrhea. Ultrasound was normal.  HIDA scan shows ejection  fraction of 28.  Because all other diseases have been ruled out, the patient  is scheduled for laparoscopic cholecystectomy for chronic cholecystitis.   PAST MEDICAL HISTORY:  1.  Hypertension.  2.  Herniated cervical disk.  3.  Chronic low back pain.   MEDICATIONS:  1.  Meridia 10 mg daily.  2.  Triamterene and potassium chloride.   PAST SURGICAL HISTORY:  1.  Diagnostic laparoscopy.  2.  Plastic surgery right arm.   ALLERGIES:  None.   PHYSICAL EXAMINATION:  VITAL SIGNS:  Blood pressure 131/97, pulse 107,  respirations 20, weight 204 pounds.  HEENT:  Unremarkable.  NECK:  Supple.  No JVD, bruits, adenopathy, or thyromegaly.  CHEST:  Clear to auscultation.  HEART:  Regular rate and rhythm without murmur, gallop or rub.  ABDOMEN:  Mild epigastric tenderness.  Normoactive bowel sounds.  EXTREMITIES:  No cyanosis, clubbing or edema.  NEUROLOGIC:  No focal motor, sensory or cerebellar deficits.   IMPRESSION:  1.  Chronic acalculous cholecystitis.  2.  Hypertension.  3.  Hyperlipidemia.   PLAN:  Laparoscopic cholecystectomy.      Dirk Dress. Katrinka Blazing, M.D.  Electronically Signed     LCS/MEDQ  D:  04/06/2005  T:  04/07/2005  Job:  [id number]   cc:   [facility]  Fax: [phone]

## 2010-10-24 NOTE — Op Note (Signed)
NAME:  Leah Phillips, Leah Phillips                ACCOUNT NO.:  [id number]   MEDICAL RECORD NO.:  [id number]          PATIENT TYPE:  OBV   LOCATION:  A325                          FACILITY:  [facility]   PHYSICIAN:  Dirk Dress. Katrinka Blazing, M.D.   DATE OF BIRTH:  [birth year]   DATE OF PROCEDURE:  DATE OF DISCHARGE:                                 OPERATIVE REPORT   PREOPERATIVE DIAGNOSIS:  Acalculous cholecystitis.   POSTOPERATIVE DIAGNOSIS:  Acalculous cholecystitis.   PROCEDURE:  Laparoscopic cholecystectomy.   SURGEON:  Dr. Katrinka Blazing.   DESCRIPTION:  Under general endotracheal anesthesia, the patient's abdomen  was prepped and draped in a sterile field. Supraumbilical incision was made,  and Veress needle was inserted uneventfully. Abdomen was insufflated with 3  liters of CO2. Using a Visiport guide, a 10-mm port was placed uneventfully.  Laparoscope was placed. Under videoscopic guidance, a 10-mm port and two 5-  mm ports were placed in the right subcostal region. The gallbladder was  grasped and positioned. Cystic duct was dissected, clipped with five clips  and divided. The cystic artery had two branches. Each was clipped with three  clips and divided. The gallbladder was separated from the intrahepatic bed  using electrocautery. The gallbladder was placed in an EndoCatch device and  retrieved. Hemostasis was achieved. There was minimal bleeding from the bed.  There was no evidence of bile leak. The staples on the cystic duct and  cystic artery branches appeared to be very stable. CO2 was allowed to escape  from the abdomen, and the ports were removed. The patient tolerated the  procedure well. Incisions were closed using 0 Vicryl on the incision at the  umbilicus and staples on all skin incisions. Dressings were placed. She was  awakened from anesthesia uneventfully, transferred to a bed, and taken to  the post anesthetic care unit for further monitoring.      Dirk Dress. Katrinka Blazing, M.D.  Electronically  Signed     LCS/MEDQ  D:  04/07/2005  T:  04/07/2005  Job:  [id number]   cc:   Milus Mallick. Lodema Hong, M.D.  Fax: [phone]

## 2011-02-26 LAB — CBC
MCHC: 33.8
MCV: 88.3
Platelets: 323
RBC: 4.38
RDW: 12.6

## 2011-02-26 LAB — BASIC METABOLIC PANEL
BUN: 14
CO2: 24
Calcium: 9.5
Chloride: 109
Creatinine, Ser: 0.68
GFR calc Af Amer: >60

## 2011-02-26 LAB — HCG, QUANTITATIVE, PREGNANCY: hCG, Beta Chain, Quant, S: <2

## 2014-02-15 ENCOUNTER — Ambulatory Visit (INDEPENDENT_AMBULATORY_CARE_PROVIDER_SITE_OTHER): Payer: BC Managed Care – PPO

## 2014-02-15 ENCOUNTER — Ambulatory Visit (INDEPENDENT_AMBULATORY_CARE_PROVIDER_SITE_OTHER): Payer: BC Managed Care – PPO | Admitting: Orthopedic Surgery

## 2014-02-15 VITALS — BP 127/90 | Ht 63.0 in | Wt 226.0 lb

## 2014-02-15 DIAGNOSIS — M7050 Other bursitis of knee, unspecified knee: Secondary | ICD-10-CM | POA: Insufficient documentation

## 2014-02-15 DIAGNOSIS — M25569 Pain in unspecified knee: Secondary | ICD-10-CM

## 2014-02-15 DIAGNOSIS — IMO0002 Reserved for concepts with insufficient information to code with codable children: Secondary | ICD-10-CM

## 2014-02-15 DIAGNOSIS — M25562 Pain in left knee: Secondary | ICD-10-CM

## 2014-02-15 NOTE — Patient Instructions (Addendum)
You have received a steroid shot. 15% of patients experience increased pain at the injection site with in the next 24 hours. This is best treated with ice and tylenol extra strength 2 tabs every 8 hours. If you are still having pain please call the office.    Apply ice to knee area 30 min every night for 1 month

## 2014-02-15 NOTE — Progress Notes (Signed)
Subjective:     Patient ID: Leah Phillips, female   DOB: 08-20-1970, 43 y.o.   MRN: [id number] New patient  HPI Chief Complaint  Patient presents with  . Knee Pain    left knee pain x 6 months, old ACL surgery 2009, no new injury   43 year old female had anterior cruciate ligament Achilles tendon allograft 6 years ago did well. She no meniscal damage at that time had some chondromalacia the medial femoral condyle and patellar femoral malalignment.  Over the last 6 months she's had pain soreness over the medial aspect of the tibia with symptoms of stiffness and giving out some aching which has become constant and is 5/10. She took ibuprofen 800 mg she says it hurts with her leg straight and sometime when she crosses over the other leg. The pain is over the medial face of the tibia just at the previous incision for the medial tibial tunnel  Medical history of hypertension arthritis  Surgical history of left knee anterior cruciate ligament reconstruction in 2006, 2006 also had cholecystectomy, 2014 had bunionectomy, 2014 a tubal ligation, 1987 had left finger surgery for ligament injury  Current medications are fully casted hydroxyzine phenetermin xyzal  Review of Systems     Objective:   Physical Exam BP 127/90  Ht  (1.6 m)  Wt 226 lb (102.513 kg)  BMI 40.04 kg/m2  LMP 02/03/2014 General appearance is normal, the patient is alert and oriented x3 with normal mood and affect. Fairly significant amount of obesity noted.  Gait and station are normal  She has bilateral hyperextension of both knees and regain flexion equal to her opposite knee after the surgery and that is noted today. The knee has a trace positive Lachman and a glide pivot shift on the left normal on the right strength normal in both skin normal in both pulse and temperature normal in both lymph nodes negative sensation normal in both legs right reflexes normal in both knees pronation balance in both upper and lower  extremities  She has tenderness over the pes bursa. Both joint lines nontender no swelling in the joint  X-rays show fairly normal appearance of the anterior cruciate ligament reconstruction transtibial technique tunnel positions    Encounter Diagnoses  Name Primary?  . Left knee pain   . Pes anserinus bursitis Yes    Recommend injection Knee, bursa  Injection Procedure Note  Pre-operative Diagnosis: Left kneepes bursitis  Post-operative Diagnosis: same  Indications: pain  Anesthesia: ethyl chloride   Procedure Details   Verbal consent was obtained for the procedure. Time out was completed.The bursal area   was prepped with alcohol, followed by  Ethyl chloride spray and A 25 gauge needle was inserted into the left knee pes bursa and  4ml 1% lidocaine and 1 ml of depomedrol  was then injected into the area  . The needle was removed and the area cleansed and dressed.  Complications:  None; patient tolerated the procedure well.    Assessment:     Encounter Diagnoses  Name Primary?  . Left knee pain   . Pes anserinus bursitis Yes        Plan:     Injection, ice and ibuprofen followup as needed

## 2014-02-16 ENCOUNTER — Other Ambulatory Visit: Payer: Self-pay | Admitting: *Deleted

## 2014-02-16 MED ORDER — IBUPROFEN 800 MG PO TABS: 800.0000 mg | ORAL_TABLET | Freq: Three times a day (TID) | ORAL | Status: DC

## 2014-04-11 DIAGNOSIS — Z789 Other specified health status: Secondary | ICD-10-CM | POA: Insufficient documentation

## 2014-06-11 ENCOUNTER — Ambulatory Visit: Payer: BC Managed Care – PPO | Admitting: Orthopedic Surgery

## 2015-03-25 ENCOUNTER — Encounter (HOSPITAL_BASED_OUTPATIENT_CLINIC_OR_DEPARTMENT_OTHER): Payer: Self-pay | Admitting: *Deleted

## 2015-03-27 ENCOUNTER — Other Ambulatory Visit (HOSPITAL_BASED_OUTPATIENT_CLINIC_OR_DEPARTMENT_OTHER): Payer: Self-pay | Admitting: Orthopaedic Surgery

## 2015-03-27 ENCOUNTER — Other Ambulatory Visit (HOSPITAL_COMMUNITY): Payer: Self-pay | Admitting: Orthopaedic Surgery

## 2015-04-01 ENCOUNTER — Ambulatory Visit (HOSPITAL_BASED_OUTPATIENT_CLINIC_OR_DEPARTMENT_OTHER): Payer: 59 | Admitting: Anesthesiology

## 2015-04-01 ENCOUNTER — Encounter (HOSPITAL_BASED_OUTPATIENT_CLINIC_OR_DEPARTMENT_OTHER): Payer: Self-pay

## 2015-04-01 ENCOUNTER — Ambulatory Visit (HOSPITAL_BASED_OUTPATIENT_CLINIC_OR_DEPARTMENT_OTHER)
Admission: RE | Admit: 2015-04-01 | Discharge: 2015-04-01 | Disposition: A | Discharge status: Home / Self Care | Payer: 59 | Source: Ambulatory Visit | Attending: Orthopaedic Surgery | Admitting: Orthopaedic Surgery

## 2015-04-01 ENCOUNTER — Encounter (HOSPITAL_BASED_OUTPATIENT_CLINIC_OR_DEPARTMENT_OTHER)
Admission: RE | Disposition: A | Discharge status: Home / Self Care | Payer: Self-pay | Source: Ambulatory Visit | Attending: Orthopaedic Surgery

## 2015-04-01 DIAGNOSIS — Z91018 Allergy to other foods: Secondary | ICD-10-CM | POA: Diagnosis not present

## 2015-04-01 DIAGNOSIS — Z880 Allergy status to penicillin: Secondary | ICD-10-CM | POA: Insufficient documentation

## 2015-04-01 DIAGNOSIS — Z6841 Body Mass Index (BMI) 40.0 and over, adult: Secondary | ICD-10-CM | POA: Insufficient documentation

## 2015-04-01 DIAGNOSIS — Z9049 Acquired absence of other specified parts of digestive tract: Secondary | ICD-10-CM | POA: Insufficient documentation

## 2015-04-01 DIAGNOSIS — Z79899 Other long term (current) drug therapy: Secondary | ICD-10-CM | POA: Insufficient documentation

## 2015-04-01 DIAGNOSIS — M67441 Ganglion, right hand: Secondary | ICD-10-CM | POA: Insufficient documentation

## 2015-04-01 DIAGNOSIS — G5601 Carpal tunnel syndrome, right upper limb: Secondary | ICD-10-CM | POA: Insufficient documentation

## 2015-04-01 HISTORY — PX: CARPAL TUNNEL RELEASE: SHX101

## 2015-04-01 HISTORY — PX: EXCISION METACARPAL MASS: SHX6372

## 2015-04-01 HISTORY — DX: Carpal tunnel syndrome, unspecified upper limb: G56.00

## 2015-04-01 SURGERY — CARPAL TUNNEL RELEASE
Anesthesia: General | Site: Hand | Laterality: Right

## 2015-04-01 MED ORDER — OXYCODONE HCL 5 MG PO TABS
ORAL_TABLET | ORAL | Status: AC
  Filled 2015-04-01: qty 1

## 2015-04-01 MED ORDER — BUPIVACAINE-EPINEPHRINE (PF) 0.5% -1:200000 IJ SOLN
INTRAMUSCULAR | Status: AC
  Filled 2015-04-01: qty 30

## 2015-04-01 MED ORDER — CLINDAMYCIN PHOSPHATE 900 MG/50ML IV SOLN
900.0000 mg | INTRAVENOUS | Status: AC
  Administered 2015-04-01: 900 mg via INTRAVENOUS

## 2015-04-01 MED ORDER — FENTANYL CITRATE (PF) 100 MCG/2ML IJ SOLN
50.0000 ug | INTRAMUSCULAR | Status: DC | PRN
  Administered 2015-04-01: 100 ug via INTRAVENOUS

## 2015-04-01 MED ORDER — FENTANYL CITRATE (PF) 100 MCG/2ML IJ SOLN
INTRAMUSCULAR | Status: AC
  Filled 2015-04-01: qty 4

## 2015-04-01 MED ORDER — HYDROCODONE-ACETAMINOPHEN 5-325 MG PO TABS: 2.0000 | ORAL_TABLET | ORAL | Status: DC | PRN

## 2015-04-01 MED ORDER — EPINEPHRINE HCL 1 MG/ML IJ SOLN
INTRAMUSCULAR | Status: AC
  Filled 2015-04-01: qty 1

## 2015-04-01 MED ORDER — PROPOFOL 500 MG/50ML IV EMUL
INTRAVENOUS | Status: AC
  Filled 2015-04-01: qty 50

## 2015-04-01 MED ORDER — LIDOCAINE HCL (CARDIAC) 20 MG/ML IV SOLN
INTRAVENOUS | Status: DC | PRN
  Administered 2015-04-01: 75 mg via INTRAVENOUS

## 2015-04-01 MED ORDER — OXYCODONE HCL 5 MG PO TABS
5.0000 mg | ORAL_TABLET | Freq: Once | ORAL | Status: AC | PRN
  Administered 2015-04-01: 5 mg via ORAL

## 2015-04-01 MED ORDER — BUPIVACAINE HCL (PF) 0.25 % IJ SOLN
INTRAMUSCULAR | Status: DC | PRN
  Administered 2015-04-01: 6 mL

## 2015-04-01 MED ORDER — ONDANSETRON HCL 4 MG/2ML IJ SOLN
INTRAMUSCULAR | Status: DC | PRN
  Administered 2015-04-01: 4 mg via INTRAVENOUS

## 2015-04-01 MED ORDER — LIDOCAINE HCL (CARDIAC) 20 MG/ML IV SOLN
INTRAVENOUS | Status: AC
  Filled 2015-04-01: qty 5

## 2015-04-01 MED ORDER — DEXAMETHASONE SODIUM PHOSPHATE 10 MG/ML IJ SOLN
INTRAMUSCULAR | Status: AC
  Filled 2015-04-01: qty 1

## 2015-04-01 MED ORDER — SCOPOLAMINE 1 MG/3DAYS TD PT72: 1.0000 | MEDICATED_PATCH | Freq: Once | TRANSDERMAL | Status: DC | PRN

## 2015-04-01 MED ORDER — HYDROMORPHONE HCL 1 MG/ML IJ SOLN: 0.2500 mg | INTRAMUSCULAR | Status: DC | PRN

## 2015-04-01 MED ORDER — PROPOFOL 10 MG/ML IV BOLUS
INTRAVENOUS | Status: AC
  Filled 2015-04-01: qty 20

## 2015-04-01 MED ORDER — MIDAZOLAM HCL 2 MG/2ML IJ SOLN
INTRAMUSCULAR | Status: AC
  Filled 2015-04-01: qty 4

## 2015-04-01 MED ORDER — BUPIVACAINE-EPINEPHRINE (PF) 0.25% -1:200000 IJ SOLN
INTRAMUSCULAR | Status: AC
  Filled 2015-04-01: qty 30

## 2015-04-01 MED ORDER — GLYCOPYRROLATE 0.2 MG/ML IJ SOLN
0.2000 mg | Freq: Once | INTRAMUSCULAR | Status: DC | PRN
Start: 2015-04-01 — End: 2015-04-01

## 2015-04-01 MED ORDER — DEXAMETHASONE SODIUM PHOSPHATE 10 MG/ML IJ SOLN
INTRAMUSCULAR | Status: DC | PRN
  Administered 2015-04-01: 10 mg via INTRAVENOUS

## 2015-04-01 MED ORDER — CLINDAMYCIN PHOSPHATE 900 MG/50ML IV SOLN
INTRAVENOUS | Status: AC
  Filled 2015-04-01: qty 50

## 2015-04-01 MED ORDER — PROPOFOL 10 MG/ML IV BOLUS
INTRAVENOUS | Status: DC | PRN
  Administered 2015-04-01: 200 mg via INTRAVENOUS

## 2015-04-01 MED ORDER — LACTATED RINGERS IV SOLN
INTRAVENOUS | Status: DC
  Administered 2015-04-01 (×2): via INTRAVENOUS

## 2015-04-01 MED ORDER — ONDANSETRON HCL 4 MG/2ML IJ SOLN
INTRAMUSCULAR | Status: AC
  Filled 2015-04-01: qty 2

## 2015-04-01 MED ORDER — BUPIVACAINE HCL (PF) 0.5 % IJ SOLN
INTRAMUSCULAR | Status: AC
  Filled 2015-04-01: qty 30

## 2015-04-01 MED ORDER — ARTIFICIAL TEARS OP OINT
TOPICAL_OINTMENT | OPHTHALMIC | Status: AC
  Filled 2015-04-01: qty 3.5

## 2015-04-01 MED ORDER — SUCCINYLCHOLINE CHLORIDE 20 MG/ML IJ SOLN
INTRAMUSCULAR | Status: AC
  Filled 2015-04-01: qty 1

## 2015-04-01 MED ORDER — BUPIVACAINE HCL (PF) 0.25 % IJ SOLN
INTRAMUSCULAR | Status: AC
  Filled 2015-04-01: qty 30

## 2015-04-01 MED ORDER — CHLORHEXIDINE GLUCONATE 4 % EX LIQD: 60.0000 mL | Freq: Once | CUTANEOUS | Status: DC

## 2015-04-01 MED ORDER — OXYCODONE HCL 5 MG/5ML PO SOLN: 5.0000 mg | Freq: Once | ORAL | Status: AC | PRN

## 2015-04-01 MED ORDER — MEPERIDINE HCL 25 MG/ML IJ SOLN: 6.2500 mg | INTRAMUSCULAR | Status: DC | PRN

## 2015-04-01 MED ORDER — MIDAZOLAM HCL 2 MG/2ML IJ SOLN
1.0000 mg | INTRAMUSCULAR | Status: DC | PRN
  Administered 2015-04-01: 2 mg via INTRAVENOUS

## 2015-04-01 SURGICAL SUPPLY — 46 items
APL SKNCLS STERI-STRIP NONHPOA (GAUZE/BANDAGES/DRESSINGS)
BANDAGE ELASTIC 4 VELCRO ST LF (GAUZE/BANDAGES/DRESSINGS) ×3 IMPLANT
BENZOIN TINCTURE PRP APPL 2/3 (GAUZE/BANDAGES/DRESSINGS) IMPLANT
BLADE SURG 15 STRL LF DISP TIS (BLADE) ×1 IMPLANT
BLADE SURG 15 STRL SS (BLADE) ×3
BNDG CMPR 9X4 STRL LF SNTH (GAUZE/BANDAGES/DRESSINGS) ×1
BNDG ESMARK 4X9 LF (GAUZE/BANDAGES/DRESSINGS) ×2 IMPLANT
CLOSURE WOUND 1/2 X4 (GAUZE/BANDAGES/DRESSINGS)
CORDS BIPOLAR (ELECTRODE) ×3 IMPLANT
COVER BACK TABLE 60X90IN (DRAPES) ×3 IMPLANT
COVER MAYO STAND STRL (DRAPES) ×3 IMPLANT
CUFF TOURNIQUET SINGLE 18IN (TOURNIQUET CUFF) ×1 IMPLANT
CUFF TOURNIQUET SINGLE 24IN (TOURNIQUET CUFF) ×2 IMPLANT
DRAPE EXTREMITY T 121X128X90 (DRAPE) ×3 IMPLANT
DRAPE SURG 17X23 STRL (DRAPES) ×3 IMPLANT
DURAPREP 26ML APPLICATOR (WOUND CARE) ×3 IMPLANT
GAUZE SPONGE 4X4 12PLY STRL (GAUZE/BANDAGES/DRESSINGS) ×3 IMPLANT
GAUZE XEROFORM 1X8 LF (GAUZE/BANDAGES/DRESSINGS) ×3 IMPLANT
GLOVE BIO SURGEON STRL SZ 6.5 (GLOVE) ×1 IMPLANT
GLOVE BIO SURGEON STRL SZ7.5 (GLOVE) ×3 IMPLANT
GLOVE BIO SURGEONS STRL SZ 6.5 (GLOVE) ×1
GLOVE BIOGEL PI IND STRL 7.0 (GLOVE) IMPLANT
GLOVE BIOGEL PI INDICATOR 7.0 (GLOVE) ×4
GOWN STRL REUS W/ TWL LRG LVL3 (GOWN DISPOSABLE) ×1 IMPLANT
GOWN STRL REUS W/ TWL XL LVL3 (GOWN DISPOSABLE) IMPLANT
GOWN STRL REUS W/TWL LRG LVL3 (GOWN DISPOSABLE) ×3
GOWN STRL REUS W/TWL XL LVL3 (GOWN DISPOSABLE) ×3
LOOP VESSEL MAXI BLUE (MISCELLANEOUS) IMPLANT
NDL HYPO 25X1 1.5 SAFETY (NEEDLE) IMPLANT
NEEDLE HYPO 25X1 1.5 SAFETY (NEEDLE) ×3 IMPLANT
NS IRRIG 1000ML POUR BTL (IV SOLUTION) ×3 IMPLANT
PACK BASIN DAY SURGERY FS (CUSTOM PROCEDURE TRAY) ×3 IMPLANT
PAD CAST 3X4 CTTN HI CHSV (CAST SUPPLIES) ×1 IMPLANT
PADDING CAST ABS 4INX4YD NS (CAST SUPPLIES)
PADDING CAST ABS COTTON 4X4 ST (CAST SUPPLIES) ×1 IMPLANT
PADDING CAST COTTON 3X4 STRL (CAST SUPPLIES) ×3
SPLINT PLASTER CAST XFAST 3X15 (CAST SUPPLIES) IMPLANT
SPLINT PLASTER XTRA FASTSET 3X (CAST SUPPLIES)
STOCKINETTE 4X48 STRL (DRAPES) ×3 IMPLANT
STRIP CLOSURE SKIN 1/2X4 (GAUZE/BANDAGES/DRESSINGS) IMPLANT
SUT ETHILON 4 0 PS 2 18 (SUTURE) ×5 IMPLANT
SUT VIC AB 4-0 P2 18 (SUTURE) ×2 IMPLANT
SYR BULB 3OZ (MISCELLANEOUS) ×3 IMPLANT
SYR CONTROL 10ML LL (SYRINGE) ×2 IMPLANT
TOWEL OR 17X24 6PK STRL BLUE (TOWEL DISPOSABLE) ×3 IMPLANT
UNDERPAD 30X30 (UNDERPADS AND DIAPERS) ×3 IMPLANT

## 2015-04-01 NOTE — Transfer of Care (Signed)
Immediate Anesthesia Transfer of Care Note  Patient: Leah Phillips  Procedure(s) Performed: Procedure(s): Right Carpal Tunnel Release (Right) Excision Right Long Finger Mass (Right)  Patient Location: PACU  Anesthesia Type:General  Level of Consciousness: awake and oriented  Airway & Oxygen Therapy: Patient Spontanous Breathing and Patient connected to face mask oxygen  Post-op Assessment: Report given to RN and Post -op Vital signs reviewed and stable  Post vital signs: Reviewed and stable  Last Vitals:  Filed Vitals:   04/01/15 0644  BP: 156/101  Pulse: 85  Temp: 36.7 C  Resp: 18    Complications: No apparent anesthesia complications

## 2015-04-01 NOTE — H&P (Signed)
Leah Phillips is an 44 y.o. female.    A 44 year old female returns with progressive right hand pain.  She has worn the splint at night, but it still wakes her up.  She has to take the splint off and shake her hand.  Her changes are closer to moderate than mild.  She denies any cervical pain.  Electrical tests with Dr. Alvester Morin were positive for moderate carpal tunnel syndrome.  She has a painful mass adjacent to the flexor tendon just distal to the long finger proximal crease.  She states with increased activity it tends to enlarge.  She uses her hand a lot.  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   We discussed options including carpal tunnel injection.  Patient states she would prefer to just proceed with surgical intervention since it bothers her on a daily basis.  [name] concerned about progressive loss of function in her hand and  not being able to work.  Had previous ACL reconstruction 10 years ago, previous left foot bunion surgery with good results.  She has been thinking about having the right foot surgically corrected but states the hand is bothering her on a daily basis so she would prefer to have the hand taken care of and wait on the foot for some point in the future.   FAMILY HISTORY/SOCIAL HISTORY/14-POINT REVIEW OF SYSTEMS:  Updated and unchanged from 03/08/2015.  Patient is single, works as a Lawyer.  Does not smoke, occasionally drinks.   MEDICATIONS:  No change.   PRIMARY CARE PHYSICIAN:  [name] followed by Dr. Sherryll Burger.   ALLERGIES:  ORANGES.  AMOXICILLIN causes some stomach pain, no true allergy.     Past Medical History  Diagnosis Date  . Carpal tunnel syndrome     Past Surgical History  Procedure Laterality Date  . Cholecystectomy    . Anterior cruciate ligament repair Left     History reviewed. No pertinent family history. Social History:  reports that she has never smoked. She does not have any smokeless tobacco history on file. She reports that she drinks  alcohol. She reports that she does not use illicit drugs.  Allergies:  Allergies  Allergen Reactions  . 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 Pecan Nut (Diagnostic) Other (See Comments)    Nuts- tested  . Penicillins Hives  . Tomato Other (See Comments)    Specific- German Johnson tomato / tingling tongue    Medications Prior to Admission  Medication Sig Dispense Refill  . folic acid (FOLVITE) 0.5 MG tablet Take 0.5 mg by mouth daily.    Marland Kitchen ibuprofen (ADVIL,MOTRIN) 800 MG tablet Take 1 tablet (800 mg total) by mouth 3 (three) times daily. 90 tablet 0  . levocetirizine (XYZAL) 5 MG tablet Take 5 mg by mouth every evening.      No results found for this or any previous visit (from the past 48 hour(s)). No results found.  Review of Systems  Constitutional: Negative.   HENT: Negative.   Respiratory: Negative.   Cardiovascular: Negative.   Gastrointestinal: Negative.   Genitourinary: Negative.   Psychiatric/Behavioral: Negative.     Blood pressure 156/101, pulse 85, temperature 98.1 F (36.7 C), temperature source Oral, resp. rate 18, height  (1.6 m), weight 105.008 kg (231 lb 8 oz), last menstrual period 03/25/2015, SpO2 100 %. Physical Exam  Constitutional: [name] oriented to person, place, and time. No distress.  HENT:  Head: Atraumatic.  Eyes: EOM are normal.  Neck:  Normal range of motion.  Cardiovascular: Normal rate.   Respiratory: No respiratory distress.  GI: She exhibits no distension.  Neurological: [name] alert and oriented to person, place, and time.  Skin: Skin is warm and dry.  Psychiatric: She has a normal mood and affect.    PHYSICAL EXAMINATION:  Patient is alert and oriented, WD, WN, NAD.  Extraocular movements intact.  Good forward flexion.  No supraclavicular lymphadenopathy.  No audible wheezing.  No abdominal tenderness.  Negative Spurling.  Positive right carpal compression.  Positive Tinel right wrist, negative on the left.  She has a 2 to 3 mm  nodule adjacent to the flexor tendon along the ulnar aspect just distal to the proximal finger crease about one-third of the way to the middle finger crease.  It is tender with palpation.   ASSESSMENT:  1.  Right long finger mass, painful. 2.  Right carpal tunnel syndrome, moderate.   PLAN:  We will proceed with carpal tunnel release and excision of right long finger mass as an outpatient.  We discussed being out of work for 2 to 3 weeks post surgery if modified duty is available.  All questions answered.  She understands and requests we proceed. Jacqulene Huntley M 04/01/2015, 7:09 AM

## 2015-04-01 NOTE — Anesthesia Procedure Notes (Signed)
Procedure Name: LMA Insertion Date/Time: 04/01/2015 7:34 AM Performed by: Zenia ResidesPAYNE, Serria Sloma D Pre-anesthesia Checklist: Patient identified, Emergency Drugs available, Suction available and Patient being monitored Patient Re-evaluated:Patient Re-evaluated prior to inductionOxygen Delivery Method: Circle System Utilized Preoxygenation: Pre-oxygenation with 100% oxygen Intubation Type: IV induction Ventilation: Mask ventilation without difficulty LMA: LMA inserted LMA Size: 4.0 Number of attempts: 1 Airway Equipment and Method: Bite block Placement Confirmation: positive ETCO2 Tube secured with: Tape Dental Injury: Teeth and Oropharynx as per pre-operative assessment

## 2015-04-01 NOTE — Anesthesia Postprocedure Evaluation (Signed)
  Anesthesia Post-op Note  Patient: Leah Phillips  Procedure(s) Performed: Procedure(s): Right Carpal Tunnel Release (Right) Excision Right Long Finger Mass (Right)  Patient Location: PACU  Anesthesia Type: General   Level of Consciousness: awake, alert  and oriented  Airway and Oxygen Therapy: Patient Spontanous Breathing  Post-op Pain: mild  Post-op Assessment: Post-op Vital signs reviewed  Post-op Vital Signs: Reviewed  Last Vitals:  Filed Vitals:   04/01/15 0902  BP: 133/89  Pulse:   Temp:   Resp:     Complications: No apparent anesthesia complications

## 2015-04-01 NOTE — Op Note (Signed)
Test test  Preop diagnosis right carpal tunnel syndrome, right long finger volar mass  Postop diagnosis: Right carpal tunnel syndrome, volar flexor tendon ganglion long finger.  Procedure: Right carpal tunnel release, excision ganglion long finger volar tendon sheath ganglion adjacent to A2 pulley  Surgeon: Annell GreeningMark Arrington Bencomo M.D.  Anesthesia LMA plus Marcaine skin local  Tourniquet time less than the 40 minutes  Procedure: After induction of LMA proximal arm tourniquet IV sedation clindamycin preoperatively prepping and draping was performed right upper extremity with DuraPrep. Usual extremity sheets and drapes were applied. Sterile skin marker was used or outline of the skin after stockinettes from sheets. A V-shaped incision was made zigzag extending over the proximal phalanx of the long finger. Mass was a small was not as tender in his larges had been when she was seen in the clinic but was adjacent to the A2 pulley over the proximal phalanx along the ulnar aspect of the tendon sheath. Arm was elevated wrapped an Esmarch incision was made of the carpal canal first using the palpable landmarks and the radial border of the ring finger ulnar border of the thumb. Palmar fascia was divided hemostasis obtained with bipolar cautery. Transverse carpal ligament was thickened and was divided along the ulnar aspect of the median nerve. There is chronic tenosynovitis changes in the flexor tendon. Complete release still fingertip could be passed into the distal aspect of the forearm underneath the fascia as well as the distally past the palmar fat. Arterial arch was visualized there is complete release. Thenar motor branch had a distal radial takeoff. No masses are present in the flexor tendons. Irrigation with saline solution wet sponge applied. Incision was made over the index finger obliquely starting a few millimeters from the middle finger crease starting on the radial aspect and extending to the small finger  crease along the ulnar border. Spreading in the midline over the flexor tendon was performed. The ulnar nerve and the ulnar digital artery was visualized and carefully protected. Incision was extended proximally and 4-0 nylon flap suture was placed for retraction. A2 pulley was visualized and just distal to the A2 pulley there was palpable nodule the came out of the flexor tendon sheath just distal to the A2 pulley along the ulnar aspect. This was opened and the synovium was excised in 1 piece. Flexor was flexed and extended there were no masses in the tendon sheath after irrigation with saline solution a single low 4-0 Vicryl suture was placed with smallest needle repairing the sheath just distal to the A2 pulley with a single suture. The mass was not sent to pathology since it was typical synovial ganglion. Wound was irrigated tourniquet deflated hemostasis obtained. For nylon sutures were placed in the long finger incision as well as carpal canal. Postop dressing was Xeroform 20 L 4 x 4's Webb roll and Ace wrap were applied for soft dressing postop patient tolerated the procedure well transferred to recovery in stable condition. Signed Annell GreeningMark Satonya Lux M.D. thanks

## 2015-04-01 NOTE — Discharge Instructions (Signed)
Keep hand elevated above heart for 24 to 48 hrs to decrease pain and swelling. Use pain medication with food as needed. Keep dressing dry. See Dr. Ophelia CharterYates in 8 days for dressing change in his office. Call if you do not already have an appt.   [phone] [facility].    Post Anesthesia Home Care Instructions  Activity: Get plenty of rest for the remainder of the day. A responsible adult should stay with you for 24 hours following the procedure.  For the next 24 hours, DO NOT: -Drive a car -Advertising copywriterperate machinery -Drink alcoholic beverages -Take any medication unless instructed by your physician -Make any legal decisions or sign important papers.  Meals: Start with liquid foods such as gelatin or soup. Progress to regular foods as tolerated. Avoid greasy, spicy, heavy foods. If nausea and/or vomiting occur, drink only clear liquids until the nausea and/or vomiting subsides. Call your physician if vomiting continues.  Special Instructions/Symptoms: Your throat may feel dry or sore from the anesthesia or the breathing tube placed in your throat during surgery. If this causes discomfort, gargle with warm salt water. The discomfort should disappear within 24 hours.  If you had a scopolamine patch placed behind your ear for the management of post- operative nausea and/or vomiting:  1. The medication in the patch is effective for 72 hours, after which it should be removed.  Wrap patch in a tissue and discard in the trash. Wash hands thoroughly with soap and water. 2. You may remove the patch earlier than 72 hours if you experience unpleasant side effects which may include dry mouth, dizziness or visual disturbances. 3. Avoid touching the patch. Wash your hands with soap and water after contact with the patch.

## 2015-04-01 NOTE — Anesthesia Preprocedure Evaluation (Addendum)
Anesthesia Evaluation  Patient identified by MRN, date of birth, ID band Patient awake    Reviewed: Allergy & Precautions, NPO status , Patient's Chart, lab work & pertinent test results  Airway Mallampati: I  TM Distance: >3 FB Neck ROM: Full    Dental  (+) Teeth Intact, Dental Advisory Given   Pulmonary    breath sounds clear to auscultation       Cardiovascular (-) hypertension Rhythm:Regular Rate:Normal     Neuro/Psych    GI/Hepatic   Endo/Other  Morbid obesity  Renal/GU      Musculoskeletal   Abdominal   Peds  Hematology   Anesthesia Other Findings   Reproductive/Obstetrics                            Anesthesia Physical Anesthesia Plan  ASA: II  Anesthesia Plan: General   Post-op Pain Management:    Induction: Intravenous  Airway Management Planned: LMA  Additional Equipment:   Intra-op Plan:   Post-operative Plan: Extubation in OR  Informed Consent: I have reviewed the patients History and Physical, chart, labs and discussed the procedure including the risks, benefits and alternatives for the proposed anesthesia with the patient or authorized representative who has indicated his/her understanding and acceptance.   Dental advisory given  Plan Discussed with: CRNA, Anesthesiologist and Surgeon  Anesthesia Plan Comments:         Anesthesia Quick Evaluation

## 2015-04-01 NOTE — Interval H&P Note (Signed)
History and Physical Interval Note:  04/01/2015 7:20 AM  Leah Phillips  has presented today for surgery, with the diagnosis of Right Carpal Tunnel Syndrome, Excision right Long finger mass  The various methods of treatment have been discussed with the patient and family. After consideration of risks, benefits and other options for treatment, the patient has consented to  Procedure(s): Right Carpal Tunnel Release (Right) Excision Right Long Finger Mass (Right) as a surgical intervention .  The patient's history has been reviewed, patient examined, no change in status, stable for surgery.  I have reviewed the patient's chart and labs.  Questions were answered to the patient's satisfaction.     Jontavious Commons C

## 2015-04-01 NOTE — Brief Op Note (Signed)
04/01/2015  8:35 AM  PATIENT:  Leah Phillips  44 y.o. female  PRE-OPERATIVE DIAGNOSIS:  Right Carpal Tunnel Syndrome, Excision right Long finger mass  POST-OPERATIVE DIAGNOSIS:  Right Carpal Tunnel Syndrome, Excision right Long finger mass  PROCEDURE:  Procedure(s): Right Carpal Tunnel Release (Right) Excision Right Long Finger Mass (Right)  SURGEON:  Surgeon(s) and Role:    * Eldred MangesMark C Yates, MD - Primary  PHYSICIAN ASSISTANT:   ASSISTANTS: none   ANESTHESIA:   local and general  EBL:  Total I/O In: 1100 [I.V.:1100] Out: -   BLOOD ADMINISTERED:none  DRAINS: none   LOCAL MEDICATIONS USED:  MARCAINE     SPECIMEN:  No Specimen  DISPOSITION OF SPECIMEN:  N/A  COUNTS:  YES  TOURNIQUET:   Total Tourniquet Time Documented: Upper Arm (Right) - 22 minutes Total: Upper Arm (Right) - 22 minutes   DICTATION: .Reubin Milanragon Dictation  PLAN OF CARE: Discharge to home after PACU  PATIENT DISPOSITION:  PACU - hemodynamically stable.   Delay start of Pharmacological VTE agent (>24hrs) due to surgical blood loss or risk of bleeding: yes

## 2015-04-02 ENCOUNTER — Encounter (HOSPITAL_BASED_OUTPATIENT_CLINIC_OR_DEPARTMENT_OTHER): Payer: Self-pay | Admitting: Orthopaedic Surgery

## 2016-03-26 ENCOUNTER — Ambulatory Visit (INDEPENDENT_AMBULATORY_CARE_PROVIDER_SITE_OTHER): Payer: Self-pay | Admitting: Orthopaedic Surgery

## 2016-03-26 ENCOUNTER — Ambulatory Visit (INDEPENDENT_AMBULATORY_CARE_PROVIDER_SITE_OTHER): Payer: BLUE CROSS/BLUE SHIELD | Admitting: Orthopaedic Surgery

## 2016-03-26 DIAGNOSIS — M545 Low back pain: Secondary | ICD-10-CM

## 2016-04-02 ENCOUNTER — Ambulatory Visit (INDEPENDENT_AMBULATORY_CARE_PROVIDER_SITE_OTHER): Payer: BLUE CROSS/BLUE SHIELD | Admitting: Orthopaedic Surgery

## 2016-04-02 ENCOUNTER — Encounter (INDEPENDENT_AMBULATORY_CARE_PROVIDER_SITE_OTHER): Payer: Self-pay | Admitting: Orthopaedic Surgery

## 2016-04-02 VITALS — BP 134/80 | HR 78 | Ht 63.0 in | Wt 221.0 lb

## 2016-04-02 DIAGNOSIS — M545 Low back pain: Secondary | ICD-10-CM

## 2016-04-02 DIAGNOSIS — G8929 Other chronic pain: Secondary | ICD-10-CM

## 2016-04-02 MED ORDER — CYCLOBENZAPRINE HCL 10 MG PO TABS: 10.0000 mg | ORAL_TABLET | Freq: Every day | ORAL | 0 refills | Status: DC

## 2016-04-02 MED ORDER — TRAMADOL HCL 50 MG PO TABS: 50.0000 mg | ORAL_TABLET | Freq: Four times a day (QID) | ORAL | 0 refills | Status: DC | PRN

## 2016-04-02 NOTE — Progress Notes (Signed)
Office Visit Note   Patient: Leah Phillips           Date of Birth: 03/05/1971           MRN: [id number] Visit Date: 04/02/2016              Requested by: Kirstie Peri, MD [address] [place], [place] [address] PCP: Kirstie Peri, MD   Assessment & Plan: Visit Diagnoses:  1. Chronic left-sided low back pain without sciatica     Plan: Chronic low back pain the left side. Previous L4-5 posterior facet cyst have actually decreased in size. She has good disc hydration call disc spaces without evidence of central lateral recess or foraminal compression. She's lost 30 pounds been on anti-inflammatories been on excised program. She states she is miserable she is leaning over the exam table today bothers on a daily basis. We'll try a left L4-5 facet injection. She's had a trochanteric injection.  Follow-Up Instructions: Return in about 4 weeks (around 04/30/2016).   Orders:  No orders of the defined types were placed in this encounter.  No orders of the defined types were placed in this encounter.     Procedures: No procedures performed   Clinical Data: No additional findings.   Subjective: Chief Complaint  Patient presents with  . Lower Back - Pain    Patient is here to review MRI Lumbar Spine.  [name] still having pain and has not noticed any changes since last visit.     Review of Systems   Objective: Vital Signs: BP 134/80   Pulse 78   Ht 5\' 3"  (1.6 m)   Wt 221 lb (100.2 kg)   LMP 03/09/2016   BMI 39.15 kg/m   Physical Exam  Constitutional: [name] oriented to person, place, and time. She appears well-developed.  HENT:  Head: Normocephalic.  Right Ear: External ear normal.  Left Ear: External ear normal.  Eyes: Pupils are equal, round, and reactive to light.  Neck: No tracheal deviation present. No thyromegaly present.  Cardiovascular: Normal rate.   Pulmonary/Chest: Effort normal.  Abdominal: Soft.  Neurological: [name] alert and oriented to person, place,  and time.  Skin: Skin is warm and dry.  Psychiatric: She has a normal mood and affect. Her behavior is normal.    Ortho Exam patient has some sciatic notch tenderness on the left Dr. tenderness on the left negative left breast S EHL anterior tib is strong. She gets some relief with Fort flexion at the hips and unloading her lower back putting her chest on the exam table. The ankle jerk are intact anterior tib distal pulses no rash or exposed skin audible wheezing good visual acuity patient's alert and oriented.  Specialty Comments:  No specialty comments available.  Imaging: No results found. <HTML><META HTTP-EQUIV="content-type" CONTENT="text/html;charset=utf-8"><p id=[id number] class=ReportBody><P>CLINICAL DATA: Low back and right leg pain. History of a<BR>right-sided synovial cyst at L4-5.<BR><BR>EXAM:<BR>MRI LUMBAR SPINE WITHOUT CONTRAST<BR><BR>TECHNIQUE:<BR>Multiplanar, multisequence MR imaging of the lumbar spine was<BR>performed. No intravenous contrast was administered.<BR><BR>COMPARISON: 06/11/2009<BR><BR>FINDINGS:<BR>Segmentation: 5 lumbar type vertebral bodies. The last full<BR>intervertebral disc space is labeled L5-S1. This is consistent with<BR>the prior MRI.<BR><BR>Alignment: Normal<BR><BR>Vertebrae: Normal<BR><BR>Conus medullaris: Extends to the L1-2 level and appears normal.<BR><BR>Paraspinal and other soft tissues: No significant findings<BR><BR>Disc levels:<BR><BR>No disc protrusions, spinal or foraminal stenosis in the lumbar<BR>spine. The facets are normally aligned. No pars defects.<BR><BR>Small bilateral synovial cysts are noted posteriorly at L4-5 but<BR>these have desiccated and retracted since the prior examination.<BR><BR>IMPRESSION:<BR>Unremarkable lumbar spine MRI examination. No disc protrusions,<BR>spinal or foraminal stenosis.<BR><BR><BR>Electronically Signed<BR>By:  Rudie MeyerP. Gallerani M.D.<BR>On: 03/31/2016 12:15<BR><BR></P></DIV><p  id=[id number] class=ProviderSection><BR><p class=ProviderType><H2>Principal Interpreter</H2></DIV><p class=ProviderInfo><P><B>Name: </B>P. Gallerani<BR><B>Provider ID: </B>[id number]<BR></P></DIV></DIV>  PMFS History: Patient Active Problem List   Diagnosis Date Noted  . Pes anserinus bursitis 02/15/2014  . PRURITUS 10/15/2009  . FATIGUE 07/23/2009  . SHOULDER PAIN, RIGHT 06/21/2009  . NECK PAIN 05/06/2009  . BACK PAIN 02/21/2009  . HYPERTENSION 10/02/2008  . KNEE PAIN, RIGHT 09/27/2008  . FOLLICULITIS 07/29/2008  . UNSPECIFIED VAGINITIS AND VULVOVAGINITIS 01/04/2008  . 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 OBESITY, UNSPECIFIED 11/11/2007  . ALLERGIC RHINITIS, SEASONAL 11/11/2007  . A C L SPRAIN-CHRONIC 06/23/2007  . TEAR A C L 06/23/2007   Past Medical History:  Diagnosis Date  . Carpal tunnel syndrome     No family history on file.  Past Surgical History:  Procedure Laterality Date  . ANTERIOR CRUCIATE LIGAMENT REPAIR Left   . CARPAL TUNNEL RELEASE Right 04/01/2015   Procedure: Right Carpal Tunnel Release;  Surgeon: Eldred MangesMark C Sabreen Kitchen, MD;  Location: [facility];  Service: Orthopedics;  Laterality: Right;  . CHOLECYSTECTOMY    . EXCISION METACARPAL MASS Right 04/01/2015   Procedure: Excision Right Long Finger Mass;  Surgeon: Eldred MangesMark C Versie Soave, MD;  Location: [facility];  Service: Orthopedics;  Laterality: Right;   Social History   Occupational History  . Not on file.   Social History Main Topics  . Smoking status: Never Smoker  . Smokeless tobacco: Not on file  . Alcohol use Yes     Comment: social  . Drug use: No  . Sexual activity: Not on file

## 2016-04-13 ENCOUNTER — Encounter (INDEPENDENT_AMBULATORY_CARE_PROVIDER_SITE_OTHER): Payer: Self-pay | Admitting: Physical Medicine and Rehabilitation

## 2016-04-13 ENCOUNTER — Ambulatory Visit (INDEPENDENT_AMBULATORY_CARE_PROVIDER_SITE_OTHER): Payer: BLUE CROSS/BLUE SHIELD | Admitting: Physical Medicine and Rehabilitation

## 2016-04-13 VITALS — BP 132/89 | HR 64

## 2016-04-13 DIAGNOSIS — M47816 Spondylosis without myelopathy or radiculopathy, lumbar region: Secondary | ICD-10-CM

## 2016-04-13 MED ORDER — METHYLPREDNISOLONE ACETATE 80 MG/ML IJ SUSP
80.0000 mg | Freq: Once | INTRAMUSCULAR | Status: AC
  Administered 2016-04-13: 80 mg

## 2016-04-13 MED ORDER — LIDOCAINE HCL (PF) 1 % IJ SOLN
0.3300 mL | Freq: Once | INTRAMUSCULAR | Status: AC
  Administered 2016-04-13: 0.3 mL

## 2016-04-13 NOTE — Progress Notes (Signed)
Leah Phillips - 45 y.o. female MRN [id number]  Date of birth: [birth year]  Office Visit Note: Visit Date: 04/13/2016 PCP: Leah Phillips Referred by: Leah PeriShah, Ashish, Phillips  Subjective: Chief Complaint  Patient presents with  . Lower Back - Pain   HPI: HPI Mrs. Leah Phillips is a 45 year old patient that I saw for her cervical spine some years ago. She's having left low back axial low back pain. Some referral and the groin. No pain with hip rotation. Pain lower back into left hip into groin and down leg to foot. Worse with sweeping, bending, turning certain way, and sleeping on left side.   Takes no blood thinners and no dye allergy  Has driver today-Trevious   ROS Otherwise per HPI.  Assessment & Plan: Visit Diagnoses:  1. Spondylosis without myelopathy or radiculopathy, lumbar region     Plan: Findings:  Chronic worsening severe left low back pain with MRI showing facet arthritis with small synovial cyst. There is no nerve impingement. I am complete a left L4-5 facet joint block.    Meds & Orders:  Meds ordered this encounter  Medications  . lidocaine (PF) (XYLOCAINE) 1 % injection 0.3 mL  . methylPREDNISolone acetate (DEPO-MEDROL) injection 80 mg    Orders Placed This Encounter  Procedures  . Nerve Block    Follow-up: Return for Follow-up Dr. Ophelia CharterYates in 2 weeks.   Procedures: No procedures performed  Lumbar Facet Joint Intra-Articular Injection(s) with Fluoroscopic Guidance  Patient: Leah Phillips      Date of Birth: [birth year] MRN: [id number] PCP: Leah Phillips      Visit Date: 04/13/2016   Universal Protocol:    Date/Time: 11/06/172:46 PM  Consent Given By: the patient  Position: PRONE   Additional Comments: Vital signs were monitored before and after the procedure. Patient was prepped and draped in the usual sterile fashion. The correct patient, procedure, and site was verified.   Injection Procedure Details:  Procedure Site One Meds Administered:  Meds  ordered this encounter  Medications  . lidocaine (PF) (XYLOCAINE) 1 % injection 0.3 mL  . methylPREDNISolone acetate (DEPO-MEDROL) injection 80 mg     Laterality: Left  Location/Site:  L4-L5  Needle size: 22 guage  Needle type: Spinal  Needle Placement: Articular  Findings:  -Contrast Used: 1 mL iohexol 180 mg iodine/mL   -Comments: Excellent flow of contrast producing a partial arthrogram.  Procedure Details: The fluoroscope beam is vertically oriented in AP, and the inferior recess is visualized beneath the lower pole of the inferior apophyseal process, which represents the target point for needle insertion. When direct visualization is difficult the target point is located at the medial projection of the vertebral pedicle. The region overlying each aforementioned target is locally anesthetized with a 1 to 2 ml. volume of 1% Lidocaine without Epinephrine.   The spinal needle was inserted into each of the above mentioned facet joints using biplanar fluoroscopic guidance. A 0.25 to 0.5 ml. volume of Isovue-250 was injected and a partial facet joint arthrogram was obtained. A single spot film was obtained of the resulting arthrogram.    One to 1.25 ml of the steroid/anesthetic solution was then injected into each of the facet joints noted above.   Additional Comments:  The patient tolerated the procedure well Dressing: Band-Aid    Post-procedure details: Patient was observed during the procedure. Post-procedure instructions were reviewed.  Patient left the clinic in stable condition.     Clinical History: Lspine MRI 03/31/2016  No disc  protrusions, spinal or foraminal stenosis in the lumbar spine. The facets are normally aligned. No pars defects.  Small bilateral synovial cysts are noted posteriorly at L4-5 but these have desiccated and retracted since the prior examination.  IMPRESSION: Unremarkable lumbar spine MRI examination. No disc protrusions, spinal or  foraminal stenosis.  She reports that she has never smoked. She does not have any smokeless tobacco history on file. No results for input(s): HGBA1C, LABURIC in the last 8760 hours.  Objective:  VS:  HT:    WT:   BMI:     BP:132/89  HR:64bpm  TEMP: ( )  RESP:100 % Physical Exam  Musculoskeletal:  The patient ambulates without aid. She does have concordant low back pain with left lumbar extension rotation.    Ortho Exam Imaging: No results found.  Past Medical/Family/Surgical/Social History: Medications & Allergies reviewed per EMR Patient Active Problem List   Diagnosis Date Noted  . Pes anserinus bursitis 02/15/2014  . PRURITUS 10/15/2009  . FATIGUE 07/23/2009  . SHOULDER PAIN, RIGHT 06/21/2009  . NECK PAIN 05/06/2009  . BACK PAIN 02/21/2009  . HYPERTENSION 10/02/2008  . KNEE PAIN, RIGHT 09/27/2008  . FOLLICULITIS 07/29/2008  . UNSPECIFIED VAGINITIS AND VULVOVAGINITIS 01/04/2008  . 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 OBESITY, UNSPECIFIED 11/11/2007  . ALLERGIC RHINITIS, SEASONAL 11/11/2007  . A C L SPRAIN-CHRONIC 06/23/2007  . TEAR A C L 06/23/2007   Past Medical History:  Diagnosis Date  . Carpal tunnel syndrome    No family history on file. Past Surgical History:  Procedure Laterality Date  . ANTERIOR CRUCIATE LIGAMENT REPAIR Left   . CARPAL TUNNEL RELEASE Right 04/01/2015   Procedure: Right Carpal Tunnel Release;  Surgeon: Eldred MangesMark C Yates, Phillips;  Location: [facility];  Service: Orthopedics;  Laterality: Right;  . CHOLECYSTECTOMY    . EXCISION METACARPAL MASS Right 04/01/2015   Procedure: Excision Right Long Finger Mass;  Surgeon: Eldred MangesMark C Yates, Phillips;  Location: [facility];  Service: Orthopedics;  Laterality: Right;   Social History   Occupational History  . Not on file.   Social History Main Topics  . Smoking status: Never Smoker  . Smokeless tobacco: Not on file  . Alcohol use Yes     Comment: social  . Drug use: No  .  Sexual activity: Not on file

## 2016-04-13 NOTE — Patient Instructions (Signed)

## 2016-04-13 NOTE — Procedures (Signed)
Lumbar Facet Joint Intra-Articular Injection(s) with Fluoroscopic Guidance  Patient: Leah Phillips      Date of Birth: 12-30-1970 MRN: [id number] PCP: Kirstie PeriSHAH,ASHISH, MD      Visit Date: 04/13/2016   Universal Protocol:    Date/Time: 11/06/172:46 PM  Consent Given By: the patient  Position: PRONE   Additional Comments: Vital signs were monitored before and after the procedure. Patient was prepped and draped in the usual sterile fashion. The correct patient, procedure, and site was verified.   Injection Procedure Details:  Procedure Site One Meds Administered:  Meds ordered this encounter  Medications  . lidocaine (PF) (XYLOCAINE) 1 % injection 0.3 mL  . methylPREDNISolone acetate (DEPO-MEDROL) injection 80 mg     Laterality: Left  Location/Site:  L4-L5  Needle size: 22 guage  Needle type: Spinal  Needle Placement: Articular  Findings:  -Contrast Used: 1 mL iohexol 180 mg iodine/mL   -Comments: Excellent flow of contrast producing a partial arthrogram.  Procedure Details: The fluoroscope beam is vertically oriented in AP, and the inferior recess is visualized beneath the lower pole of the inferior apophyseal process, which represents the target point for needle insertion. When direct visualization is difficult the target point is located at the medial projection of the vertebral pedicle. The region overlying each aforementioned target is locally anesthetized with a 1 to 2 ml. volume of 1% Lidocaine without Epinephrine.   The spinal needle was inserted into each of the above mentioned facet joints using biplanar fluoroscopic guidance. A 0.25 to 0.5 ml. volume of Isovue-250 was injected and a partial facet joint arthrogram was obtained. A single spot film was obtained of the resulting arthrogram.    One to 1.25 ml of the steroid/anesthetic solution was then injected into each of the facet joints noted above.   Additional Comments:  The patient tolerated the procedure  well Dressing: Band-Aid    Post-procedure details: Patient was observed during the procedure. Post-procedure instructions were reviewed.  Patient left the clinic in stable condition.

## 2016-04-23 ENCOUNTER — Ambulatory Visit (INDEPENDENT_AMBULATORY_CARE_PROVIDER_SITE_OTHER): Payer: BLUE CROSS/BLUE SHIELD | Admitting: Orthopaedic Surgery

## 2016-04-23 ENCOUNTER — Encounter (INDEPENDENT_AMBULATORY_CARE_PROVIDER_SITE_OTHER): Payer: Self-pay | Admitting: Orthopaedic Surgery

## 2016-04-23 VITALS — BP 127/87 | HR 63 | Ht 63.0 in | Wt 219.0 lb

## 2016-04-23 DIAGNOSIS — M542 Cervicalgia: Secondary | ICD-10-CM

## 2016-04-23 NOTE — Progress Notes (Signed)
Mailed to Dr. Sherryll BurgerShah

## 2016-04-23 NOTE — Progress Notes (Signed)
Office Visit Note   Patient: Leah Phillips           Date of Birth: March 06, 1971           MRN: [id number] Visit Date: 04/23/2016              Requested by: Kirstie PeriAshish Shah, MD [address] [place], [place] [address] PCP: Kirstie PeriSHAH,ASHISH, MD   Assessment & Plan: Visit Diagnoses:  1. NECK PAIN     Plan: Symptoms are just been present for a few days she had previous MRI about 6 years ago that showed disc bulging at C4-5, 5-6 and C6-7 which eliminated the epidural space almost completely. She has some prednisone left after she had the treatment for hip she can take this. If she has persistent symptoms she'll let us know we can consider repeating diagnostic scan or referral for physical therapy for cervical traction etc.  Follow-Up Instructions: No Follow-up on file.   Orders:  No orders of the defined types were placed in this encounter.  No orders of the defined types were placed in this encounter.     Procedures: No procedures performed   Clinical Data: No additional findings.   Subjective: Chief Complaint  Patient presents with  . Lower Back - Pain  . Left Hip - Pain    Patient is here for follow up epidural steroid injection. She states that after the injection, the left hip pain is gone. She does have a new problem with pain in the left parascapular region x 2-3 days. She has a known history of arthritis in her spine.    Review of Systems  Constitutional: Negative for chills and diaphoresis.  HENT: Negative for ear discharge, ear pain and nosebleeds.   Eyes: Negative for discharge and visual disturbance.  Respiratory: Negative for cough, choking and shortness of breath.   Cardiovascular: Negative for chest pain and palpitations.  Gastrointestinal: Negative for abdominal distention and abdominal pain.  Endocrine: Negative for cold intolerance and heat intolerance.  Genitourinary: Negative for flank pain and hematuria.  Musculoskeletal: Positive for back pain, joint swelling  and myalgias.  Skin: Negative for rash and wound.  Neurological: Negative for seizures and speech difficulty.  Hematological: Negative for adenopathy. Does not bruise/bleed easily.  Psychiatric/Behavioral: Negative for agitation and suicidal ideas.     Objective: Vital Signs: BP 127/87   Pulse 63   Ht 5\' 3"  (1.6 m)   Wt 219 lb (99.3 kg)   LMP 03/09/2016   BMI 38.79 kg/m   Physical Exam  Constitutional: [name] oriented to person, place, and time. She appears well-developed.  HENT:  Head: Normocephalic.  Right Ear: External ear normal.  Left Ear: External ear normal.  Eyes: Pupils are equal, round, and reactive to light.  Neck: No tracheal deviation present. No thyromegaly present.  Cardiovascular: Normal rate.   Pulmonary/Chest: Effort normal.  Abdominal: Soft.  Neurological: [name] alert and oriented to person, place, and time.  Skin: Skin is warm and dry.  Psychiatric: She has a normal mood and affect. Her behavior is normal.    Ortho Exam patient brachial plexus tenderness in the left hip tenderness along the medial border of the scapula positive tenderness nerves point negative drop arm test left and right no impingement. Upper extremity reflexes are 2+ and symmetrical normal heel toe gait. She still has the old knee reconstruction surgery for anterior cruciate ligament surgery which is doing well. Her symptoms been present for just a few days. The  rash of her exposed skin upper show remote motor strength against resisted testing is normal ulnar median nerve radial sensory is intact normal radial resisted strength.  Specialty Comments:  No specialty comments available.  Imaging: No results found.   PMFS History: Patient Active Problem List   Diagnosis Date Noted  . Pes anserinus bursitis 02/15/2014  . PRURITUS 10/15/2009  . FATIGUE 07/23/2009  . SHOULDER PAIN, RIGHT 06/21/2009  . NECK PAIN 05/06/2009  . BACK PAIN 02/21/2009  . HYPERTENSION 10/02/2008  . KNEE  PAIN, RIGHT 09/27/2008  . FOLLICULITIS 07/29/2008  . UNSPECIFIED VAGINITIS AND VULVOVAGINITIS 01/04/2008  . 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 OBESITY, UNSPECIFIED 11/11/2007  . ALLERGIC RHINITIS, SEASONAL 11/11/2007  . A C L SPRAIN-CHRONIC 06/23/2007  . TEAR A C L 06/23/2007   Past Medical History:  Diagnosis Date  . Carpal tunnel syndrome     History reviewed. No pertinent family history.  Past Surgical History:  Procedure Laterality Date  . ANTERIOR CRUCIATE LIGAMENT REPAIR Left   . CARPAL TUNNEL RELEASE Right 04/01/2015   Procedure: Right Carpal Tunnel Release;  Surgeon: Eldred MangesMark C Ceceilia Cephus, MD;  Location: [facility];  Service: Orthopedics;  Laterality: Right;  . CHOLECYSTECTOMY    . EXCISION METACARPAL MASS Right 04/01/2015   Procedure: Excision Right Long Finger Mass;  Surgeon: Eldred MangesMark C Mahayla Haddaway, MD;  Location: [facility];  Service: Orthopedics;  Laterality: Right;   Social History   Occupational History  . Not on file.   Social History Main Topics  . Smoking status: Never Smoker  . Smokeless tobacco: Never Used  . Alcohol use Yes     Comment: social  . Drug use: No  . Sexual activity: Not on file

## 2016-05-12 ENCOUNTER — Ambulatory Visit (INDEPENDENT_AMBULATORY_CARE_PROVIDER_SITE_OTHER): Payer: BLUE CROSS/BLUE SHIELD | Admitting: Orthopaedic Surgery

## 2016-06-04 ENCOUNTER — Ambulatory Visit (INDEPENDENT_AMBULATORY_CARE_PROVIDER_SITE_OTHER): Payer: BLUE CROSS/BLUE SHIELD | Admitting: Orthopaedic Surgery

## 2016-06-04 ENCOUNTER — Encounter (INDEPENDENT_AMBULATORY_CARE_PROVIDER_SITE_OTHER): Payer: Self-pay | Admitting: Orthopaedic Surgery

## 2016-06-04 VITALS — BP 123/83 | HR 65 | Ht 63.0 in | Wt 219.0 lb

## 2016-06-04 DIAGNOSIS — M25562 Pain in left knee: Secondary | ICD-10-CM

## 2016-06-04 DIAGNOSIS — M25552 Pain in left hip: Secondary | ICD-10-CM | POA: Diagnosis not present

## 2016-06-04 MED ORDER — BUPIVACAINE HCL 0.25 % IJ SOLN
0.6600 mL | INTRAMUSCULAR | Status: AC | PRN
  Administered 2016-06-04: .66 mL via INTRA_ARTICULAR

## 2016-06-04 MED ORDER — BUPIVACAINE HCL 0.5 % IJ SOLN
2.0000 mL | INTRAMUSCULAR | Status: AC | PRN
  Administered 2016-06-04: 2 mL via INTRA_ARTICULAR

## 2016-06-04 MED ORDER — METHYLPREDNISOLONE ACETATE 40 MG/ML IJ SUSP
80.0000 mg | INTRAMUSCULAR | Status: AC | PRN
  Administered 2016-06-04: 80 mg

## 2016-06-04 MED ORDER — LIDOCAINE HCL 1 % IJ SOLN
0.5000 mL | INTRAMUSCULAR | Status: AC | PRN
  Administered 2016-06-04: .5 mL

## 2016-06-04 MED ORDER — LIDOCAINE HCL 1 % IJ SOLN
1.0000 mL | INTRAMUSCULAR | Status: AC | PRN
  Administered 2016-06-04: 1 mL

## 2016-06-04 MED ORDER — METHYLPREDNISOLONE ACETATE 40 MG/ML IJ SUSP
40.0000 mg | INTRAMUSCULAR | Status: AC | PRN
  Administered 2016-06-04: 40 mg via INTRA_ARTICULAR

## 2016-06-04 NOTE — Progress Notes (Signed)
Office Visit Note   Patient: Leah Phillips           Date of Birth: 1970/10/15           MRN: [id number] Visit Date: 06/04/2016              Requested by: Kirstie PeriAshish Shah, MD [address] [place], [place] [address] PCP: Kirstie PeriSHAH,ASHISH, MD   Assessment & Plan: Visit Diagnoses:  1. Acute pain of left knee   2. Pain of left hip joint       Acute synovitis left knee and left trochanteric bursitis secondary to exercise program with greater than 25 pound weight loss. Plan: Injections performed post injection she got good relief. We discussed the modification workout activities. [name] congratulated on her weight loss which will help her overall health. Follow-up when necessary  Follow-Up Instructions: Return if symptoms worsen or fail to improve.   Orders:  Orders Placed This Encounter  Procedures  . Large Joint Injection/Arthrocentesis  . Large Joint Injection/Arthrocentesis   No orders of the defined types were placed in this encounter.     Procedures: Large Joint Inj Date/Time: 06/04/2016 3:46 PM Performed by: Eldred MangesYATES, Hance Caspers C Authorized by: Eldred MangesYATES, Pearlina Friedly C   Consent Given by:  Patient Site marked: the procedure site was marked   Indications:  Pain and joint swelling Location:  Knee Site:  L knee Needle Size:  22 G Needle Length:  1.5 inches Approach:  Anterolateral Ultrasound Guidance: No   Fluoroscopic Guidance: No   Arthrogram: No   Medications:  1 mL lidocaine 1 %; 40 mg methylPREDNISolone acetate 40 MG/ML; 0.66 mL bupivacaine 0.25 % Patient tolerance:  Patient tolerated the procedure well with no immediate complications Large Joint Inj Date/Time: 06/04/2016 3:46 PM Performed by: Eldred MangesYATES, Damontae Loppnow C Authorized by: Annell GreeningYATES, Jakwon Gayton C   Location:  Hip Site:  L greater trochanter Approach:  Lateral Ultrasound Guidance: No   Fluoroscopic Guidance: No   Arthrogram: No   Medications:  0.5 mL lidocaine 1 %; 2 mL bupivacaine 0.5 %; 80 mg methylPREDNISolone acetate 40  MG/ML     Clinical Data: No additional findings.   Subjective: Chief Complaint  Patient presents with  . Left Knee - Pain  . Left Hip - Pain    Patient is here with left hip and left knee pain. She requests two injections, but states if she can only get one, the left hip pain is worse. Her last hip injection was 03/05/2016. Patient thinks that she had a knee injection in March, but it was not found in the system.  Her last knee x-ray was done 02/15/2014.    Review of Systems  Constitutional: Negative for chills and diaphoresis.  HENT: Negative for ear discharge, ear pain and nosebleeds.   Eyes: Negative for discharge and visual disturbance.  Respiratory: Negative for cough, choking and shortness of breath.   Cardiovascular: Negative for chest pain and palpitations.  Gastrointestinal: Negative for abdominal distention and abdominal pain.  Endocrine: Negative for cold intolerance and heat intolerance.  Genitourinary: Negative for flank pain and hematuria.  Musculoskeletal:       Cervical spondylosis previous epidural steroid with good relief. She's been through physical therapy. Previous carpal tunnel releases now asymptomatic. Patient's been exercising as lost 28 pounds is been doing a lot of walking. She increased her walking and has had increased left greater trochanteric pain as well as left knee pain with synovitis.  Skin: Negative for rash and wound.  Neurological: Negative  for seizures and speech difficulty.  Hematological: Negative for adenopathy. Does not bruise/bleed easily.  Psychiatric/Behavioral: Negative for agitation and suicidal ideas.     Objective: Vital Signs: BP 123/83   Pulse 65   Ht 5\' 3"  (1.6 m)   Wt 219 lb (99.3 kg)   BMI 38.79 kg/m   Physical Exam  Constitutional: [name] oriented to person, place, and time. She appears well-developed.  HENT:  Head: Normocephalic.  Right Ear: External ear normal.  Left Ear: External ear normal.  Eyes: Pupils are  equal, round, and reactive to light.  Neck: No tracheal deviation present. No thyromegaly present.  Cardiovascular: Normal rate.   Pulmonary/Chest: Effort normal.  Abdominal: Soft.  Musculoskeletal:  Healed carpal tunnel incisions. She has a left knee synovitis. Exquisite tenderness of the left greater trochanter. Reflexes are 2+ no pain with hip range of motion. Good cervical range of motion no brachial plexus tenderness.  Neurological: [name] alert and oriented to person, place, and time.  Skin: Skin is warm and dry.  Psychiatric: She has a normal mood and affect. Her behavior is normal.    Ortho Exam  Specialty Comments:  No specialty comments available.  Imaging: No results found.   PMFS History: Patient Active Problem List   Diagnosis Date Noted  . Pes anserinus bursitis 02/15/2014  . PRURITUS 10/15/2009  . FATIGUE 07/23/2009  . SHOULDER PAIN, RIGHT 06/21/2009  . NECK PAIN 05/06/2009  . BACK PAIN 02/21/2009  . HYPERTENSION 10/02/2008  . KNEE PAIN, RIGHT 09/27/2008  . FOLLICULITIS 07/29/2008  . UNSPECIFIED VAGINITIS AND VULVOVAGINITIS 01/04/2008  . 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 OBESITY, UNSPECIFIED 11/11/2007  . ALLERGIC RHINITIS, SEASONAL 11/11/2007  . A C L SPRAIN-CHRONIC 06/23/2007  . TEAR A C L 06/23/2007   Past Medical History:  Diagnosis Date  . Carpal tunnel syndrome     No family history on file.  Past Surgical History:  Procedure Laterality Date  . ANTERIOR CRUCIATE LIGAMENT REPAIR Left   . CARPAL TUNNEL RELEASE Right 04/01/2015   Procedure: Right Carpal Tunnel Release;  Surgeon: Eldred MangesMark C Rehana Uncapher, MD;  Location: [facility];  Service: Orthopedics;  Laterality: Right;  . CHOLECYSTECTOMY    . EXCISION METACARPAL MASS Right 04/01/2015   Procedure: Excision Right Long Finger Mass;  Surgeon: Eldred MangesMark C Ralphine Hinks, MD;  Location: [facility];  Service: Orthopedics;  Laterality: Right;   Social History   Occupational History   . Not on file.   Social History Main Topics  . Smoking status: Never Smoker  . Smokeless tobacco: Never Used  . Alcohol use Yes     Comment: social  . Drug use: No  . Sexual activity: Not on file

## 2016-07-09 ENCOUNTER — Ambulatory Visit (INDEPENDENT_AMBULATORY_CARE_PROVIDER_SITE_OTHER): Payer: BLUE CROSS/BLUE SHIELD | Admitting: Otolaryngology

## 2016-07-09 DIAGNOSIS — H93293 Other abnormal auditory perceptions, bilateral: Secondary | ICD-10-CM | POA: Diagnosis not present

## 2016-07-09 DIAGNOSIS — H6983 Other specified disorders of Eustachian tube, bilateral: Secondary | ICD-10-CM | POA: Diagnosis not present

## 2016-10-01 ENCOUNTER — Ambulatory Visit (INDEPENDENT_AMBULATORY_CARE_PROVIDER_SITE_OTHER): Payer: BLUE CROSS/BLUE SHIELD | Admitting: Orthopaedic Surgery

## 2016-11-19 ENCOUNTER — Ambulatory Visit (INDEPENDENT_AMBULATORY_CARE_PROVIDER_SITE_OTHER): Payer: Self-pay

## 2016-11-19 ENCOUNTER — Encounter (INDEPENDENT_AMBULATORY_CARE_PROVIDER_SITE_OTHER): Payer: Self-pay | Admitting: Orthopaedic Surgery

## 2016-11-19 ENCOUNTER — Ambulatory Visit (INDEPENDENT_AMBULATORY_CARE_PROVIDER_SITE_OTHER): Payer: BLUE CROSS/BLUE SHIELD | Admitting: Orthopaedic Surgery

## 2016-11-19 VITALS — BP 113/83 | HR 78 | Ht 64.0 in | Wt 210.0 lb

## 2016-11-19 DIAGNOSIS — M79641 Pain in right hand: Secondary | ICD-10-CM

## 2016-11-19 MED ORDER — METHYLPREDNISOLONE ACETATE 40 MG/ML IJ SUSP
40.0000 mg | INTRAMUSCULAR | Status: AC | PRN
  Administered 2016-11-19: 40 mg via INTRA_ARTICULAR

## 2016-11-19 MED ORDER — LIDOCAINE HCL 1 % IJ SOLN
1.0000 mL | INTRAMUSCULAR | Status: AC | PRN
  Administered 2016-11-19: 1 mL

## 2016-11-19 MED ORDER — BUPIVACAINE HCL 0.25 % IJ SOLN
0.6600 mL | INTRAMUSCULAR | Status: AC | PRN
  Administered 2016-11-19: .66 mL via INTRA_ARTICULAR

## 2016-11-19 NOTE — Progress Notes (Signed)
Office Visit Note   Patient: Leah Phillips           Date of Birth: December 17, 1970           MRN: [id number] Visit Date: 11/19/2016              Requested by: Kirstie Peri, MD [address] [place], [place] [address] PCP: Kirstie Peri, MD   Assessment & Plan: Visit Diagnoses:  1. Pain in right hand   2.  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 She tolerated the injection well and return if she has increased problems.  Follow-Up Instructions: No Follow-up on file.   Orders:  Orders Placed This Encounter  Procedures  . XR Hand Complete Right   No orders of the defined types were placed in this encounter.     Procedures: Large Joint Inj Date/Time: 11/19/2016 11:00 AM Performed by: Eldred Manges Authorized by: Annell Greening C   Consent Given by:  Patient Indications:  Pain and joint swelling Location:  Knee Site:  L knee Needle Size:  22 G Needle Length:  1.5 inches Approach:  Anterolateral Ultrasound Guidance: No   Fluoroscopic Guidance: No   Arthrogram: No   Medications:  1 mL lidocaine 1 %; 40 mg methylPREDNISolone acetate 40 MG/ML; 0.66 mL bupivacaine 0.25 % Aspiration Attempted: No   Patient tolerance:  Patient tolerated the procedure well with no immediate complications     Clinical Data: No additional findings.   Subjective: Chief Complaint  Patient presents with  . Right Middle Finger - Pain  . Left Knee - Pain    HPI patient off a ladder 11/08/2016 she states she's been getting out her kitchen and is doing some remodeling. She said increased pain in the right long finger which is the one that had the ganglion excised. She's had recurrence of the ganglion. When she fell she had injury to the long finger and has stiffness. She's used ice elevation. Sensation the fingertip is good but she states she's had pain  and stiffness problems using the hand and she is right-hand dominant. Previous injection left knee 2017 which did well and she requested a repeat injection in increased problems. Denies chills or fever no locking of the left knee. Past history of of left knee complete anterior cruciate ligament tear.  Review of Systems14 point review of systems updated and is unchanged from 05/27/2016 office visit other than as mentioned in history of present illness. Previous carpal tunnel release doing well right hand. Ongoing problems with the back pain   Objective: Vital Signs: BP 113/83   Pulse 78   Ht 5\' 4"  (1.626 m)   Wt 210 lb (95.3 kg)   BMI 36.05 kg/m   Physical Exam  Constitutional: [name] oriented to person, place, and time. She appears well-developed.  HENT:  Head: Normocephalic.  Right Ear: External ear normal.  Left Ear: External ear normal.  Eyes: Pupils are equal, round, and reactive to light.  Neck: No tracheal deviation present. No thyromegaly present.  Cardiovascular: Normal rate.   Pulmonary/Chest: Effort normal.  Abdominal: Soft.  Musculoskeletal:  Patient some swelling right long finger at the PIP joint. Cyst is noted along the proximal finger crease along the ulnar aspect with the well-healed old incision from ganglion removal. Well-healed carpal tunnel incision no tenderness over the carpal canal.  Collateral ligaments are stable finger sensation fingertip is intact terminal extensor and flexors intact supple my are intact. Left knee has some crepitus with range of motion. She has problems doing single stance step raise. Positive Lachman consistent with old anterior cruciate ligament tear.  Neurological: [name] alert and oriented to person, place, and time.  Skin: Skin is warm and dry.  Psychiatric: She has a normal mood and affect. Her behavior is normal.    Ortho Exam  Specialty Comments:  No specialty comments available.  Imaging: No results found.   PMFS  History: Patient Active Problem List   Diagnosis Date Noted  . Pes anserinus bursitis 02/15/2014  . PRURITUS 10/15/2009  . FATIGUE 07/23/2009  . SHOULDER PAIN, RIGHT 06/21/2009  . NECK PAIN 05/06/2009  . BACK PAIN 02/21/2009  . HYPERTENSION 10/02/2008  . KNEE PAIN, RIGHT 09/27/2008  . FOLLICULITIS 07/29/2008  . UNSPECIFIED VAGINITIS AND VULVOVAGINITIS 01/04/2008  . 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 OBESITY, UNSPECIFIED 11/11/2007  . ALLERGIC RHINITIS, SEASONAL 11/11/2007  . A C L SPRAIN-CHRONIC 06/23/2007  . TEAR A C L 06/23/2007   Past Medical History:  Diagnosis Date  . Carpal tunnel syndrome     No family history on file.  Past Surgical History:  Procedure Laterality Date  . ANTERIOR CRUCIATE LIGAMENT REPAIR Left   . CARPAL TUNNEL RELEASE Right 04/01/2015   Procedure: Right Carpal Tunnel Release;  Surgeon: Eldred MangesMark C Taje Tondreau, MD;  Location: [facility];  Service: Orthopedics;  Laterality: Right;  . CHOLECYSTECTOMY    . EXCISION METACARPAL MASS Right 04/01/2015   Procedure: Excision Right Long Finger Mass;  Surgeon: Eldred MangesMark C Juancarlos Crescenzo, MD;  Location: [facility];  Service: Orthopedics;  Laterality: Right;   Social History   Occupational History  . Not on file.   Social History Main Topics  . Smoking status: Never Smoker  . Smokeless tobacco: Never Used  . Alcohol use Yes     Comment: social  . Drug use: No  . Sexual activity: Not on file

## 2016-12-17 ENCOUNTER — Ambulatory Visit (INDEPENDENT_AMBULATORY_CARE_PROVIDER_SITE_OTHER): Payer: BLUE CROSS/BLUE SHIELD | Admitting: Orthopaedic Surgery

## 2016-12-17 ENCOUNTER — Encounter (INDEPENDENT_AMBULATORY_CARE_PROVIDER_SITE_OTHER): Payer: Self-pay | Admitting: Orthopaedic Surgery

## 2016-12-17 VITALS — BP 129/84 | HR 71 | Ht 64.0 in | Wt 209.0 lb

## 2016-12-17 DIAGNOSIS — M67921 Unspecified disorder of synovium and tendon, right upper arm: Secondary | ICD-10-CM

## 2016-12-17 NOTE — Progress Notes (Signed)
Office Visit Note   Patient: Leah Phillips           Date of Birth: [birth year]           MRN: [id number] Visit Date: 12/17/2016              Requested by: Kirstie Peri, MD [address] [place], [place] [address] PCP: Kirstie Peri, MD   Assessment & Plan: Visit Diagnoses:  1. Biceps tendinopathy of right upper extremity       Distal biceps tendinopathy  Plan: Description given for Voltaren gel. She can apply this twice a day. We'll set her up for some physical therapy for treatment of distal biceps tendinopathy of the right arm. Office follow-up 4 weeks.  Follow-Up Instructions: Return in about 4 weeks (around 01/14/2017).   Orders:  No orders of the defined types were placed in this encounter.  No orders of the defined types were placed in this encounter.     Procedures: No procedures performed   Clinical Data: No additional findings.   Subjective: Chief Complaint  Patient presents with  . Right Arm - Pain    HPI patient seen with insidious onset of right arm pain starting at the elbow with pain with elbow extension. She had previous cyst removed from her third fingers had some recurrence. She has pain with outstretched reaching or pulling as well as with supination against resistance. She's lost 30 pounds is gotten off her blood pressure medication. She has use ibuprofen and ice. No fever chills no associated neck pain. Left knee is doing well after injection 11/19/2016. Good result with right carpal tunnel release 2016.  Review of Systems past history of obesity with 30 pound weight loss recently. History of anterior cruciate ligament tear, shoulder pain, low back pain. 14 point review of systems otherwise negative as it pertains to her history of present illness.  Objective: Vital Signs: BP 129/84   Pulse 71   Ht 5\' 4"  (1.626 m)   Wt 209 lb (94.8 kg)   BMI 35.87 kg/m   Physical Exam  Constitutional: [name] oriented to person, place, and time. She appears  well-developed.  HENT:  Head: Normocephalic.  Right Ear: External ear normal.  Left Ear: External ear normal.  Eyes: Pupils are equal, round, and reactive to light.  Neck: No tracheal deviation present. No thyromegaly present.  Cardiovascular: Normal rate.   Pulmonary/Chest: Effort normal.  Abdominal: Soft.  Musculoskeletal:  Patient has no shoulder impingement good cervical range of motion. She has exquisite tenderness of the distal biceps tendon in a cubital fossa without palpable defect. Pain with resisted supination with resisted elbow flexion. Radial head and lateral joint is normal medial lateral epicondyles are normal. Healed carpal tunnel incision no thenar atrophy. Proximal biceps tendon is nontender, negative findings for shoulder rotator cuff weakness IR, ER or supraspinatus.  Neurological: [name] alert and oriented to person, place, and time.  Skin: Skin is warm and dry.  Psychiatric: She has a normal mood and affect. Her behavior is normal.    Ortho Exam  Specialty Comments:  No specialty comments available.  Imaging: No results found.   PMFS History: Patient Active Problem List   Diagnosis Date Noted  . Pes anserinus bursitis 02/15/2014  . PRURITUS 10/15/2009  . FATIGUE 07/23/2009  . SHOULDER PAIN, RIGHT 06/21/2009  . NECK PAIN 05/06/2009  . BACK PAIN 02/21/2009  . HYPERTENSION 10/02/2008  . KNEE PAIN, RIGHT 09/27/2008  . FOLLICULITIS 07/29/2008  .  UNSPECIFIED VAGINITIS AND VULVOVAGINITIS 01/04/2008  . 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 OBESITY, UNSPECIFIED 11/11/2007  . ALLERGIC RHINITIS, SEASONAL 11/11/2007  . A C L SPRAIN-CHRONIC 06/23/2007  . TEAR A C L 06/23/2007   Past Medical History:  Diagnosis Date  . Carpal tunnel syndrome     No family history on file.  Past Surgical History:  Procedure Laterality Date  . ANTERIOR CRUCIATE LIGAMENT REPAIR Left   . CARPAL TUNNEL RELEASE Right 04/01/2015   Procedure: Right Carpal Tunnel Release;   Surgeon: Eldred MangesMark C Josuha Fontanez, MD;  Location: [facility];  Service: Orthopedics;  Laterality: Right;  . CHOLECYSTECTOMY    . EXCISION METACARPAL MASS Right 04/01/2015   Procedure: Excision Right Long Finger Mass;  Surgeon: Eldred MangesMark C Kamari Buch, MD;  Location: [facility];  Service: Orthopedics;  Laterality: Right;   Social History   Occupational History  . Not on file.   Social History Main Topics  . Smoking status: Never Smoker  . Smokeless tobacco: Never Used  . Alcohol use Yes     Comment: social  . Drug use: No  . Sexual activity: Not on file

## 2016-12-21 ENCOUNTER — Telehealth (INDEPENDENT_AMBULATORY_CARE_PROVIDER_SITE_OTHER): Payer: Self-pay | Admitting: Orthopaedic Surgery

## 2016-12-21 NOTE — Telephone Encounter (Signed)
Patient called stated she was advised by the pharmacy that the gel needed to be pre auth. Patient asked for a call back as soon as possible. The number to contact patient is [phone]

## 2016-12-22 MED ORDER — DICLOFENAC SODIUM 2 % TD SOLN: 2.0000 | Freq: Two times a day (BID) | TRANSDERMAL | 0 refills | Status: DC | PRN

## 2016-12-22 NOTE — Addendum Note (Signed)
Addended by: Rogers SeedsYEATTS, Yessica Putnam M on: 12/22/2016 11:23 AM   Modules accepted: Orders

## 2016-12-22 NOTE — Telephone Encounter (Signed)
Voltaren Gel is not covered unless osteoarthritis.  Per Dr. Ophelia CharterYates, okay for Pennsaid. I called patient to advise. [facility]

## 2017-01-14 ENCOUNTER — Encounter (INDEPENDENT_AMBULATORY_CARE_PROVIDER_SITE_OTHER): Payer: Self-pay | Admitting: Orthopaedic Surgery

## 2017-01-14 ENCOUNTER — Ambulatory Visit (INDEPENDENT_AMBULATORY_CARE_PROVIDER_SITE_OTHER): Payer: BLUE CROSS/BLUE SHIELD | Admitting: Orthopaedic Surgery

## 2017-01-14 VITALS — BP 122/51 | HR 68 | Ht 64.0 in | Wt 206.0 lb

## 2017-01-14 DIAGNOSIS — M25562 Pain in left knee: Secondary | ICD-10-CM | POA: Diagnosis not present

## 2017-01-14 DIAGNOSIS — M25521 Pain in right elbow: Secondary | ICD-10-CM | POA: Diagnosis not present

## 2017-01-14 NOTE — Progress Notes (Signed)
Office Visit Note   Patient: Leah Phillips           Date of Birth: [birth year]           MRN: [id number] Visit Date: 01/14/2017              Requested by: Kirstie Peri, MD [address] [place], [place] [address] PCP: Kirstie Peri, MD   Assessment & Plan: Visit Diagnoses:  1. Acute pain of left knee   2. Right elbow pain     Plan: She'll continue with full caring cream continue to work on her exercises to get fully elbow extension and she's been doing. I plan to recheck her in 6 weeks.  Follow-Up Instructions: No Follow-up on file.   Orders:  No orders of the defined types were placed in this encounter.  No orders of the defined types were placed in this encounter.     Procedures: No procedures performed   Clinical Data: No additional findings.   Subjective: Chief Complaint  Patient presents with  . Right Elbow - Follow-up, Pain  . Left Knee - Pain    HPI short-term she been doing physical therapy she still working the as a Scientist, product/process development. She's noticed some swelling in her hand and some difficulty getting her fingers completely flex the distal palmar crease. States her elbow sometimes feel like that once the pop in the area that she had pain anteriorly in the antecubital space which was painful with resisted flexion has improved and she's gotten 50% improvement in her elbow extension. Initially she lacked 23 region. Extension currently she is at 8. She still has pain with supination no pain with pronation.  Review of Systems 14 point review of systems updated and is unchanged from 12/17/2016 office visit other than as mentioned in history of present illness   Objective: Vital Signs: BP (!) 122/51   Pulse 68   Ht 5\' 4"  (1.626 m)   Wt 206 lb (93.4 kg)   BMI 35.36 kg/m   Physical Exam  Constitutional: [name] oriented to person, place, and time. She appears well-developed.  HENT:  Head: Normocephalic.  Right Ear: External ear normal.  Left Ear: External ear  normal.  Eyes: Pupils are equal, round, and reactive to light.  Neck: No tracheal deviation present. No thyromegaly present.  Cardiovascular: Normal rate.   Pulmonary/Chest: Effort normal.  Abdominal: Soft.  Musculoskeletal:  Status trace swelling in her wrists and fingers she lacks 2-3 mm touching fingertip to distal palmar crease. No PIP flexion deformity flexion is easier with the wrist in extension. Biceps tendon distally at the elbow is the minimally tender. Chest tenderness over the flexor origin at the medial upper condyle which reproduces some of her pain and also pain over the olecranon and some tenderness with percussion over the cubital tunnel. Well-healed carpal tunnel incision. Normal heel toe gait no rash overexposed skin no lymphadenopathy good cervical range of motion.  Neurological: [name] alert and oriented to person, place, and time.  Skin: Skin is warm and dry.  Psychiatric: She has a normal mood and affect. Her behavior is normal.    Ortho Exam  Specialty Comments:  No specialty comments available.  Imaging: No results found.   PMFS History: Patient Active Problem List   Diagnosis Date Noted  . Acute pain of left knee 01/14/2017  . Right elbow pain 01/14/2017  . Pes anserinus bursitis 02/15/2014  . PRURITUS 10/15/2009  . FATIGUE 07/23/2009  . SHOULDER  PAIN, RIGHT 06/21/2009  . NECK PAIN 05/06/2009  . BACK PAIN 02/21/2009  . HYPERTENSION 10/02/2008  . KNEE PAIN, RIGHT 09/27/2008  . FOLLICULITIS 07/29/2008  . UNSPECIFIED VAGINITIS AND VULVOVAGINITIS 01/04/2008  . 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 OBESITY, UNSPECIFIED 11/11/2007  . ALLERGIC RHINITIS, SEASONAL 11/11/2007  . A C L SPRAIN-CHRONIC 06/23/2007  . TEAR A C L 06/23/2007   Past Medical History:  Diagnosis Date  . Carpal tunnel syndrome     No family history on file.  Past Surgical History:  Procedure Laterality Date  . ANTERIOR CRUCIATE LIGAMENT REPAIR Left   . CARPAL TUNNEL  RELEASE Right 04/01/2015   Procedure: Right Carpal Tunnel Release;  Surgeon: Eldred MangesMark C Yates, MD;  Location: [facility];  Service: Orthopedics;  Laterality: Right;  . CHOLECYSTECTOMY    . EXCISION METACARPAL MASS Right 04/01/2015   Procedure: Excision Right Long Finger Mass;  Surgeon: Eldred MangesMark C Yates, MD;  Location: [facility];  Service: Orthopedics;  Laterality: Right;   Social History   Occupational History  . Not on file.   Social History Main Topics  . Smoking status: Never Smoker  . Smokeless tobacco: Never Used  . Alcohol use Yes     Comment: social  . Drug use: No  . Sexual activity: Not on file

## 2017-01-26 ENCOUNTER — Telehealth (INDEPENDENT_AMBULATORY_CARE_PROVIDER_SITE_OTHER): Payer: Self-pay | Admitting: Radiology

## 2017-01-26 DIAGNOSIS — M25521 Pain in right elbow: Secondary | ICD-10-CM

## 2017-01-26 NOTE — Telephone Encounter (Signed)
Dr. Ophelia Charter told patient to call [facility] office to get referral for a mri of the right arm. Patient states she finished physical therapy and would like the referral since its till not better.

## 2017-01-26 NOTE — Telephone Encounter (Signed)
OK for order? 

## 2017-01-26 NOTE — Telephone Encounter (Signed)
MRI of right  elbow, distal biceps partial tear. [facility] thanks

## 2017-01-26 NOTE — Telephone Encounter (Signed)
Sent to [facility]

## 2017-01-27 NOTE — Telephone Encounter (Signed)
I left voicemail for patient to return call and let me know where she would like MRI scheduled.

## 2017-01-27 NOTE — Addendum Note (Signed)
Addended by: Rogers Seeds on: 01/27/2017 05:06 PM   Modules accepted: Orders

## 2017-01-27 NOTE — Telephone Encounter (Signed)
Patient returned call and would like for MRI to be scheduled at [facility] in [place]. Order entered. I will have staff at [facility] office get precert if needed and schedule.

## 2017-02-04 ENCOUNTER — Ambulatory Visit (INDEPENDENT_AMBULATORY_CARE_PROVIDER_SITE_OTHER): Payer: BLUE CROSS/BLUE SHIELD | Admitting: Orthopaedic Surgery

## 2017-02-04 ENCOUNTER — Encounter (INDEPENDENT_AMBULATORY_CARE_PROVIDER_SITE_OTHER): Payer: Self-pay | Admitting: Orthopaedic Surgery

## 2017-02-04 VITALS — BP 135/92 | HR 69

## 2017-02-04 DIAGNOSIS — G5621 Lesion of ulnar nerve, right upper limb: Secondary | ICD-10-CM | POA: Diagnosis not present

## 2017-02-04 DIAGNOSIS — M67441 Ganglion, right hand: Secondary | ICD-10-CM | POA: Diagnosis not present

## 2017-02-04 NOTE — Progress Notes (Signed)
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  #2 recurrent ganglion cyst right long finger  at the level of the proximal finger crease   Plan:  Patients having trouble working should like to proceed with ulnar nerve transposition to stabilize the ulnar nerve so that she can the return to normal work activity. She also liked of the recurrent cyst at the base of the right long finger remove she understands this may recur again in the future.  Follow-Up Instructions: No Follow-up on file.   Orders:  No orders of the defined types were placed in this encounter.  No orders of the defined types were placed in this encounter.     Procedures: No procedures performed   Clinical Data: No additional findings.   Subjective: Chief Complaint  Patient presents with  . Right Elbow - Pain, Follow-up    HPI test 5212. [age] year old female returns with ongoing problems  With her right elbow.She works as a Press photographerCNAN PATIENT"S MINUTES HAD GRaDUAL PROGRESSION OF ELBOW PAIN NOT RESPONSIVE TO SPLINTING ANTI_INFLAMMATORIES.  An  MRI scan was obtained which showed the ulnar nerve was subluxed out of the cubital tunnel with increased signal in the nerve consistent with neuritis.  Review of Systems   Unchanged from last office visit on 01/14/17.    Objective: Vital Signs: BP (!) 135/92   Pulse 69   Physical Exam  Constitutional: [name] oriented to person, place, and time. She appears well-developed.  HENT:  Head: Normocephalic.  Right Ear: External ear normal.  Left Ear: External ear normal.  Eyes: Pupils are equal, round, and reactive to light.  Neck: No tracheal deviation present. No thyromegaly  present.  Cardiovascular: Normal rate.   Pulmonary/Chest: Effort normal.  Abdominal: Soft.  Neurological: [name] alert and oriented to person, place, and time.  Skin: Skin is warm and dry.  Psychiatric: She has a normal mood and affect. Her behavior is normal.    Ortho Exam She lacks 20 reaching elbow extension. Positive Tinel's over the medial aspect of the elbow. Pressure in the subcutaneous tissue over the medial condyle sheets pain into the ring and small finger. No interosseous wasting ring and small finger profundus are strong triceps is normal good cervical range of motion. Triceps refles intact negative Spurling of the neck. No wrist flexion weakness.patient has a tender recurrent flexor tendon ganglion at the base of the long finger at the proximal finger crease ulnar side which is tender. senstaion to UDN is normal to the finger tip with normal 2 point.   Specialty Comments:  No specialty comments available.  Imaging:  MRI scan of the right elbow on 01/27/2017 shows post traumatic elbow effusion with intact collateral ligaments no bone injury. Ulnar nerve shows increased signal   And is subluxed out of the cubitaltunnel consistant with repetitive  subluxation .   PMFS History: Patient Active Problem List   Diagnosis Date Noted  . Ganglion cyst of finger of right hand 02/04/2017  . Acute pain of left knee 01/14/2017  . Right  elbow pain 01/14/2017  . Pes anserinus bursitis 02/15/2014  . PRURITUS 10/15/2009  . FATIGUE 07/23/2009  . SHOULDER PAIN, RIGHT 06/21/2009  . NECK PAIN 05/06/2009  . BACK PAIN 02/21/2009  . HYPERTENSION 10/02/2008  . KNEE PAIN, RIGHT 09/27/2008  . FOLLICULITIS 07/29/2008  . UNSPECIFIED VAGINITIS AND VULVOVAGINITIS 01/04/2008  . 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 OBESITY, UNSPECIFIED 11/11/2007  . ALLERGIC RHINITIS, SEASONAL 11/11/2007  . A C L SPRAIN-CHRONIC 06/23/2007  . TEAR A C L 06/23/2007   Past Medical History:  Diagnosis Date    . Carpal tunnel syndrome     No family history on file.  Past Surgical History:  Procedure Laterality Date  . ANTERIOR CRUCIATE LIGAMENT REPAIR Left   . CARPAL TUNNEL RELEASE Right 04/01/2015   Procedure: Right Carpal Tunnel Release;  Surgeon: Eldred Manges, MD;  Location: [facility];  Service: Orthopedics;  Laterality: Right;  . CHOLECYSTECTOMY    . EXCISION METACARPAL MASS Right 04/01/2015   Procedure: Excision Right Long Finger Mass;  Surgeon: Eldred Manges, MD;  Location: [facility];  Service: Orthopedics;  Laterality: Right;   Social History   Occupational History  . Not on file.   Social History Main Topics  . Smoking status: Never Smoker  . Smokeless tobacco: Never Used  . Alcohol use Yes     Comment: social  . Drug use: No  . Sexual activity: Not on file

## 2017-02-17 ENCOUNTER — Telehealth (INDEPENDENT_AMBULATORY_CARE_PROVIDER_SITE_OTHER): Payer: Self-pay | Admitting: Orthopaedic Surgery

## 2017-02-17 MED ORDER — TRAMADOL HCL 50 MG PO TABS: 50.0000 mg | ORAL_TABLET | Freq: Four times a day (QID) | ORAL | 0 refills | Status: DC | PRN

## 2017-02-17 NOTE — Telephone Encounter (Signed)
Refill tramadol same as last year # 30 thanks

## 2017-02-17 NOTE — Telephone Encounter (Signed)
Please advise 

## 2017-02-17 NOTE — Telephone Encounter (Signed)
Patient called asked if Dr Ophelia CharterYates can prescribe her something for pain. The number to contact patient is [phone]

## 2017-02-17 NOTE — Telephone Encounter (Signed)
Called to pharmacy. I left voicemail for patient advising. °

## 2017-02-18 ENCOUNTER — Telehealth (INDEPENDENT_AMBULATORY_CARE_PROVIDER_SITE_OTHER): Payer: Self-pay | Admitting: Orthopaedic Surgery

## 2017-02-18 NOTE — Telephone Encounter (Signed)
[facility] pharmacy called needing diag.code for Rx Tramadol. The number to contact [facility] is [phone]

## 2017-02-22 NOTE — Telephone Encounter (Signed)
I called and gave diagnosis code. Asked for return call if further problems ,

## 2017-02-23 ENCOUNTER — Telehealth (INDEPENDENT_AMBULATORY_CARE_PROVIDER_SITE_OTHER): Payer: Self-pay | Admitting: Orthopaedic Surgery

## 2017-02-23 NOTE — Telephone Encounter (Signed)
Patient called wanting to set up her surgery with Dr. Ophelia Charter. CB # [phone]

## 2017-02-25 ENCOUNTER — Ambulatory Visit (INDEPENDENT_AMBULATORY_CARE_PROVIDER_SITE_OTHER): Payer: BLUE CROSS/BLUE SHIELD | Admitting: Orthopaedic Surgery

## 2017-02-25 ENCOUNTER — Encounter (INDEPENDENT_AMBULATORY_CARE_PROVIDER_SITE_OTHER): Payer: Self-pay | Admitting: Orthopaedic Surgery

## 2017-02-25 ENCOUNTER — Ambulatory Visit (INDEPENDENT_AMBULATORY_CARE_PROVIDER_SITE_OTHER): Payer: BLUE CROSS/BLUE SHIELD

## 2017-02-25 VITALS — BP 127/85 | HR 68

## 2017-02-25 DIAGNOSIS — G8929 Other chronic pain: Secondary | ICD-10-CM | POA: Diagnosis not present

## 2017-02-25 DIAGNOSIS — M1712 Unilateral primary osteoarthritis, left knee: Secondary | ICD-10-CM | POA: Diagnosis not present

## 2017-02-25 DIAGNOSIS — M25562 Pain in left knee: Secondary | ICD-10-CM

## 2017-02-25 NOTE — Progress Notes (Signed)
Office Visit Note   Patient: Leah Phillips           Date of Birth: [birth year]           MRN: [id number] Visit Date: 02/25/2017              Requested by: Kirstie Peri, MD [address] [place], [place] [address] PCP: Kirstie Peri, MD   Assessment & Plan: Visit Diagnoses:  1. Chronic pain of left knee   2. Unilateral primary osteoarthritis, left knee     Plan: We discussed the water aerobics continue losing weight she started lost 30 pounds she'll continue with her diet and try to do low impact exercising such as riding a bike. Exercises in the pool or also an option. She tolerated the injection well and office follow-up as needed. [name] waiting surgical scheduling for arm will return if she has increased problems with her knee.  Follow-Up Instructions: Return if symptoms worsen or fail to improve.   Orders:  Orders Placed This Encounter  Procedures  . XR KNEE 3 VIEW LEFT   No orders of the defined types were placed in this encounter.     Procedures: No procedures performed   Clinical Data: No additional findings.   Subjective: Chief Complaint  Patient presents with  . Left Knee - Pain    HPI patient turns having increased problems with her left knee which is a new problem. Her primary care doctor noticed a Baker's cyst she's lost 30 pounds with dieting and excise was having increased pain in her left knee. She had previous anterior cruciate ligament reconstruction more than 15 years ago. No instability patterns. She has some pain with stairs. Pain primarily medial joint line. No fever chills no rheumatologic conditions. She continues to have ongoing problems with the ulnar nerve and is waiting on surgery approval.  Review of Systems 14 for review of systems updated unchanged.   Objective: Vital Signs: BP 127/85   Pulse 68   Physical Exam  Constitutional: [name] oriented to person, place, and time. She appears well-developed.  HENT:  Head: Normocephalic.  Right  Ear: External ear normal.  Left Ear: External ear normal.  Eyes: Pupils are equal, round, and reactive to light.  Neck: No tracheal deviation present. No thyromegaly present.  Cardiovascular: Normal rate.   Pulmonary/Chest: Effort normal.  Abdominal: Soft.  Neurological: [name] alert and oriented to person, place, and time.  Skin: Skin is warm and dry.  Psychiatric: She has a normal mood and affect. Her behavior is normal.    Ortho Exam well-healed the anterior cruciate ligament incision portals. Moderate-sized Baker's cyst palpable. Patellofemoral joint crepitus mild. Significant medial joint line tenderness palpable medial osteophytes. She had mature with the needle. Distal pulses intact with hip range of motion negative straight leg raising 90.  Specialty Comments:  No specialty comments available.  Imaging: Xr Knee 3 View Left  Result Date: 02/25/2017 Three-view x-rays left knee obtained. This shows medial joint line narrowing with loss of 75% joint space marginal os fights. Postoperative changes from tunnel placement for anterior cruciate ligament reconstruction. She had BioSorb absorbable screws. Impression knee osteoarthritis primarily medial compartment. Previous anterior cruciate ligament reconstruction changes    PMFS History: Patient Active Problem List   Diagnosis Date Noted  . Ganglion cyst of finger of right hand 02/04/2017  . Acute pain of left knee 01/14/2017  . Right elbow pain 01/14/2017  . Pes anserinus bursitis 02/15/2014  . PRURITUS  10/15/2009  . FATIGUE 07/23/2009  . SHOULDER PAIN, RIGHT 06/21/2009  . NECK PAIN 05/06/2009  . BACK PAIN 02/21/2009  . HYPERTENSION 10/02/2008  . KNEE PAIN, RIGHT 09/27/2008  . FOLLICULITIS 07/29/2008  . UNSPECIFIED VAGINITIS AND VULVOVAGINITIS 01/04/2008  . 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 OBESITY, UNSPECIFIED 11/11/2007  . ALLERGIC RHINITIS, SEASONAL 11/11/2007  . A C L SPRAIN-CHRONIC 06/23/2007  . TEAR A C L  06/23/2007   Past Medical History:  Diagnosis Date  . Carpal tunnel syndrome     No family history on file.  Past Surgical History:  Procedure Laterality Date  . ANTERIOR CRUCIATE LIGAMENT REPAIR Left   . CARPAL TUNNEL RELEASE Right 04/01/2015   Procedure: Right Carpal Tunnel Release;  Surgeon: Eldred Manges, MD;  Location: [facility];  Service: Orthopedics;  Laterality: Right;  . CHOLECYSTECTOMY    . EXCISION METACARPAL MASS Right 04/01/2015   Procedure: Excision Right Long Finger Mass;  Surgeon: Eldred Manges, MD;  Location: [facility];  Service: Orthopedics;  Laterality: Right;   Social History   Occupational History  . Not on file.   Social History Main Topics  . Smoking status: Never Smoker  . Smokeless tobacco: Never Used  . Alcohol use Yes     Comment: social  . Drug use: No  . Sexual activity: Not on file

## 2017-03-08 ENCOUNTER — Other Ambulatory Visit: Payer: Self-pay

## 2017-03-08 DIAGNOSIS — M67441 Ganglion, right hand: Secondary | ICD-10-CM | POA: Diagnosis not present

## 2017-03-08 DIAGNOSIS — G5621 Lesion of ulnar nerve, right upper limb: Secondary | ICD-10-CM | POA: Diagnosis not present

## 2017-03-18 ENCOUNTER — Ambulatory Visit (INDEPENDENT_AMBULATORY_CARE_PROVIDER_SITE_OTHER): Payer: BLUE CROSS/BLUE SHIELD | Admitting: Orthopaedic Surgery

## 2017-03-18 ENCOUNTER — Encounter (INDEPENDENT_AMBULATORY_CARE_PROVIDER_SITE_OTHER): Payer: Self-pay | Admitting: Orthopaedic Surgery

## 2017-03-18 ENCOUNTER — Telehealth (INDEPENDENT_AMBULATORY_CARE_PROVIDER_SITE_OTHER): Payer: Self-pay | Admitting: Radiology

## 2017-03-18 VITALS — BP 128/84 | HR 76

## 2017-03-18 DIAGNOSIS — G5621 Lesion of ulnar nerve, right upper limb: Secondary | ICD-10-CM

## 2017-03-18 NOTE — Telephone Encounter (Signed)
Note faxed.

## 2017-03-18 NOTE — Progress Notes (Signed)
   Post-Op Visit Note   Patient: Leah Phillips           Date of Birth: 1971/04/24           MRN: [id number] Visit Date: 03/18/2017 PCP: Kirstie Peri, MD   Assessment & Plan: Postop right ulnar nerve transposition for subluxation and excision of right long finger recurrent flexor tendon ganglion. Dressings changed return 1 week for suture removal.  Chief Complaint: Postop right cubital tunnel and long finger flexor tendon sheath ganglion excision one week postop Visit Diagnoses:  1. Cubital tunnel syndrome on right   2.    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 She remains out of work.  Follow-Up Instructions: Return in about 1 week (around 03/25/2017).   Orders:  No orders of the defined types were placed in this encounter.  No orders of the defined types were placed in this encounter.   Imaging: No results found.  PMFS History: Patient Active Problem List   Diagnosis Date Noted  . Ganglion cyst of finger of right hand 02/04/2017  . Acute pain of left knee 01/14/2017  . Right elbow pain 01/14/2017  . Pes anserinus bursitis 02/15/2014  . PRURITUS 10/15/2009  . FATIGUE 07/23/2009  . SHOULDER PAIN, RIGHT 06/21/2009  . NECK PAIN 05/06/2009  . BACK PAIN 02/21/2009  . HYPERTENSION 10/02/2008  . KNEE PAIN, RIGHT 09/27/2008  . FOLLICULITIS 07/29/2008  . UNSPECIFIED VAGINITIS AND VULVOVAGINITIS 01/04/2008  . 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 OBESITY, UNSPECIFIED 11/11/2007  . ALLERGIC RHINITIS, SEASONAL 11/11/2007  . A C L SPRAIN-CHRONIC 06/23/2007  . TEAR A C L 06/23/2007   Past Medical History:  Diagnosis Date  . Carpal tunnel syndrome     No family history on file.  Past Surgical History:  Procedure Laterality Date  . ANTERIOR CRUCIATE LIGAMENT REPAIR Left   . CARPAL TUNNEL RELEASE Right 04/01/2015   Procedure: Right Carpal Tunnel Release;  Surgeon: Eldred Manges, MD;  Location: [facility];  Service: Orthopedics;  Laterality: Right;  . CHOLECYSTECTOMY    . EXCISION METACARPAL MASS Right 04/01/2015   Procedure: Excision Right Long Finger Mass;  Surgeon: Eldred Manges, MD;  Location: [facility];  Service: Orthopedics;  Laterality: Right;   Social History   Occupational History  . Not on file.   Social History Main Topics  . Smoking status: Never Smoker  . Smokeless tobacco: Never Used  . Alcohol use Yes     Comment: social  . Drug use: No  . Sexual activity: Not on file

## 2017-03-18 NOTE — Telephone Encounter (Signed)
Patient came in to [facility] office and requested out of work note from 03/08/2017-03/17/2017. She would like it faxed to [phone].  Patient underwent right ulnar nerve excision and removal of recurrent cyst on 03/08/2017.  OK for note?

## 2017-03-18 NOTE — Telephone Encounter (Signed)
oK THANKS 

## 2017-03-25 ENCOUNTER — Encounter (INDEPENDENT_AMBULATORY_CARE_PROVIDER_SITE_OTHER): Payer: Self-pay | Admitting: Orthopaedic Surgery

## 2017-03-25 ENCOUNTER — Ambulatory Visit (INDEPENDENT_AMBULATORY_CARE_PROVIDER_SITE_OTHER): Payer: BLUE CROSS/BLUE SHIELD | Admitting: Orthopaedic Surgery

## 2017-03-25 DIAGNOSIS — M67441 Ganglion, right hand: Secondary | ICD-10-CM

## 2017-03-25 DIAGNOSIS — M25521 Pain in right elbow: Secondary | ICD-10-CM

## 2017-03-25 NOTE — Progress Notes (Signed)
   Post-Op Visit Note   Patient: Leah Phillips           Date of Birth: 1971/05/23           MRN: [id number] Visit Date: 03/25/2017 PCP: Kirstie PeriShah, Ashish, MD   Assessment & Plan: Post cubital tunnel release and ulnar nerve transposition right elbow and removal of recurrent cyst right hand at the base of the long finger. Sutures are removed. He is going be resuming work at the end of November. Return here as needed. She notes relief of her preop ulnar nerve symptoms.  Chief Complaint:  Chief Complaint  Patient presents with  . Right Elbow - Pain  . Right Middle Finger - Pain   Visit Diagnoses:  1. Right elbow pain   2. Ganglion cyst of finger of right hand     Plan: Satisfactory postop result from ulnar nerve anterior transposition. Strength is good sutures are removed she'll resume work in November and also follow-up with us on a when necessary basis.  Follow-Up Instructions: Return if symptoms worsen or fail to improve.   Orders:  No orders of the defined types were placed in this encounter.  No orders of the defined types were placed in this encounter.   Imaging: No results found.  PMFS History: Patient Active Problem List   Diagnosis Date Noted  . Ganglion cyst of finger of right hand 02/04/2017  . Acute pain of left knee 01/14/2017  . Right elbow pain 01/14/2017  . Pes anserinus bursitis 02/15/2014  . PRURITUS 10/15/2009  . FATIGUE 07/23/2009  . SHOULDER PAIN, RIGHT 06/21/2009  . NECK PAIN 05/06/2009  . BACK PAIN 02/21/2009  . HYPERTENSION 10/02/2008  . KNEE PAIN, RIGHT 09/27/2008  . FOLLICULITIS 07/29/2008  . UNSPECIFIED VAGINITIS AND VULVOVAGINITIS 01/04/2008  . 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 OBESITY, UNSPECIFIED 11/11/2007  . ALLERGIC RHINITIS, SEASONAL 11/11/2007  . A C L SPRAIN-CHRONIC 06/23/2007  . TEAR A C L 06/23/2007   Past Medical History:  Diagnosis Date  . Carpal tunnel syndrome     No family history on file.  Past Surgical  History:  Procedure Laterality Date  . ANTERIOR CRUCIATE LIGAMENT REPAIR Left   . CARPAL TUNNEL RELEASE Right 04/01/2015   Procedure: Right Carpal Tunnel Release;  Surgeon: Eldred MangesMark C Yates, MD;  Location: [facility];  Service: Orthopedics;  Laterality: Right;  . CHOLECYSTECTOMY    . EXCISION METACARPAL MASS Right 04/01/2015   Procedure: Excision Right Long Finger Mass;  Surgeon: Eldred MangesMark C Yates, MD;  Location: [facility];  Service: Orthopedics;  Laterality: Right;   Social History   Occupational History  . Not on file.   Social History Main Topics  . Smoking status: Never Smoker  . Smokeless tobacco: Never Used  . Alcohol use Yes     Comment: social  . Drug use: No  . Sexual activity: Not on file

## 2017-04-22 ENCOUNTER — Ambulatory Visit (INDEPENDENT_AMBULATORY_CARE_PROVIDER_SITE_OTHER): Payer: BLUE CROSS/BLUE SHIELD | Admitting: Orthopaedic Surgery

## 2017-04-22 ENCOUNTER — Ambulatory Visit (INDEPENDENT_AMBULATORY_CARE_PROVIDER_SITE_OTHER): Payer: Self-pay

## 2017-04-22 ENCOUNTER — Encounter (INDEPENDENT_AMBULATORY_CARE_PROVIDER_SITE_OTHER): Payer: Self-pay | Admitting: Orthopaedic Surgery

## 2017-04-22 DIAGNOSIS — M25522 Pain in left elbow: Secondary | ICD-10-CM | POA: Diagnosis not present

## 2017-04-22 NOTE — Progress Notes (Signed)
Office Visit Note   Patient: Leah Phillips           Date of Birth: [birth year]           MRN: [id number] Visit Date: 04/22/2017              Requested by: Kirstie PeriShah, Ashish, MD [address] [place], [place] [id number] PCP: Kirstie PeriShah, Ashish, MD   Assessment & Plan: Visit Diagnoses:  1. Pain in left elbow     Plan: New rest, Voltaren cream.  She has a sling for the opposite arm she can use intermittently.  Turn if she has persistent problems.  Follow-Up Instructions: Return if symptoms worsen or fail to improve.   Orders:  Orders Placed This Encounter  Procedures  . XR Elbow 2 Views Left   No orders of the defined types were placed in this encounter.     Procedures: No procedures performed   Clinical Data: No additional findings.   Subjective: Chief Complaint  Patient presents with  . Left Elbow - Pain    HPI 46-year-old female returns she had cubital tunnel release on the right and also excision of recurrent cyst in the webspace of her finger.  These are both doing well but she has had a new problem when she has had her right arm in sling with limited usage after surgery she was using her left arm more now is having proximal forearm pain over the pronator.  She has pain with supination.  Points directly over the mobile wad she has exquisite tenderness.  [name] using Voltaren cream.  Ice or heat.  She does have ibuprofen at home.  Review of Systems 14 point review of systems updated and is unchanged since her surgery other than as mentioned in HPI.   Objective: Vital Signs: There were no vitals taken for this visit.  Physical Exam  Constitutional: [name] oriented to person, place, and time. She appears well-developed.  HENT:  Head: Normocephalic.  Right Ear: External ear normal.  Left Ear: External ear normal.  Eyes: Pupils are equal, round, and reactive to light.  Neck: No tracheal deviation present. No thyromegaly present.  Cardiovascular: Normal rate.  Pulmonary/Chest:  Effort normal.  Abdominal: Soft.  Neurological: [name] alert and oriented to person, place, and time.  Skin: Skin is warm and dry.  Psychiatric: She has a normal mood and affect. Her behavior is normal.    Ortho Exam pain is exquisite pain with palpation over the mobile wad and proximal extensor tendon.  Visit tenderness with palpation that radiates down to the mid forearm but not all the way down to the distal forearm or radial side of the hand.  No pain with resisted pronation.  She has pain with supination.  Negative Froment sign.  Cross our strong finger extension is normal.  Cervical range of motion. Specialty Comments:  No specialty comments available.  Imaging: No results found.   PMFS History: Patient Active Problem List   Diagnosis Date Noted  . Ganglion cyst of finger of right hand 02/04/2017  . Acute pain of left knee 01/14/2017  . Right elbow pain 01/14/2017  . Pes anserinus bursitis 02/15/2014  . PRURITUS 10/15/2009  . FATIGUE 07/23/2009  . SHOULDER PAIN, RIGHT 06/21/2009  . NECK PAIN 05/06/2009  . BACK PAIN 02/21/2009  . HYPERTENSION 10/02/2008  . KNEE PAIN, RIGHT 09/27/2008  . FOLLICULITIS 07/29/2008  . UNSPECIFIED VAGINITIS AND VULVOVAGINITIS 01/04/2008  . 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 OBESITY,  UNSPECIFIED 11/11/2007  . ALLERGIC RHINITIS, SEASONAL 11/11/2007  . A C L SPRAIN-CHRONIC 06/23/2007  . TEAR A C L 06/23/2007   Past Medical History:  Diagnosis Date  . Carpal tunnel syndrome     No family history on file.  Past Surgical History:  Procedure Laterality Date  . ANTERIOR CRUCIATE LIGAMENT REPAIR Left   . CARPAL TUNNEL RELEASE Right 04/01/2015   Procedure: Right Carpal Tunnel Release;  Surgeon: Eldred MangesMark C Giuseppina Quinones, MD;  Location: [facility];  Service: Orthopedics;  Laterality: Right;  . CHOLECYSTECTOMY    . EXCISION METACARPAL MASS Right 04/01/2015   Procedure: Excision Right Long Finger Mass;  Surgeon: Eldred MangesMark C Creg Gilmer, MD;   Location: [facility];  Service: Orthopedics;  Laterality: Right;   Social History   Occupational History  . Not on file  Tobacco Use  . Smoking status: Never Smoker  . Smokeless tobacco: Never Used  Substance and Sexual Activity  . Alcohol use: Yes    Comment: social  . Drug use: No  . Sexual activity: Not on file

## 2017-05-13 ENCOUNTER — Ambulatory Visit (INDEPENDENT_AMBULATORY_CARE_PROVIDER_SITE_OTHER): Payer: BLUE CROSS/BLUE SHIELD | Admitting: Orthopaedic Surgery

## 2017-06-10 ENCOUNTER — Ambulatory Visit (INDEPENDENT_AMBULATORY_CARE_PROVIDER_SITE_OTHER): Payer: BLUE CROSS/BLUE SHIELD | Admitting: Orthopaedic Surgery

## 2017-06-10 VITALS — BP 115/90 | HR 70 | Ht 64.0 in | Wt 206.0 lb

## 2017-06-10 DIAGNOSIS — M2392 Unspecified internal derangement of left knee: Secondary | ICD-10-CM

## 2017-06-13 ENCOUNTER — Encounter (INDEPENDENT_AMBULATORY_CARE_PROVIDER_SITE_OTHER): Payer: Self-pay | Admitting: Orthopaedic Surgery

## 2017-06-13 NOTE — Progress Notes (Signed)
Post-Op Visit Note   Patient: Leah Phillips           Date of Birth: [birth year]           MRN: [id number] Visit Date: 06/10/2017 PCP: Kirstie PeriShah, Ashish, MD   Assessment & Plan: Patient is postop surgery.  Had problems with left knee pain which is a new problem brace on her knee, ice, previous injection last September as well as ibuprofen.  Patient had previous left knee ACL reconstruction she does not recall any specific injury associated with the onset of her symptoms.  Tried to get up her knee is locked she is had difficulty walking.  We will proceed with an MRI scan due to her left knee locking history of ACL injury.  MRI reviewed which shows satisfactory position of tunnels as well as incorporation of the bone both sides..  14 point review of systems updated and is unchanged from last office visit 03/18/2017 other than as mentioned in HPI.  Orthopedic exam: Good resolution of the cyst of the flexor tendon sheath.  Cubital tunnel incisions well-healed.  Knee reaches full extension 20 degrees.  She has exquisite medial joint line tenderness normal patellar tracking negative Lockman negative pivot shift test.  Well-healed ACL incision.  Collateral ligaments are stable.  Distal pulses are intact, negative straight leg raising.  Chief Complaint:  Chief Complaint  Patient presents with  . Left Knee - Pain   Visit Diagnoses:  1. Knee locking, left     Plan: As listed above MRI scan left knee rule out meniscal tear  With left knee  locking  Follow-Up Instructions: No Follow-up on file.   Orders:  No orders of the defined types were placed in this encounter.  No orders of the defined types were placed in this encounter.   Imaging: No results found.  PMFS History: Patient Active Problem List   Diagnosis Date Noted  . Ganglion cyst of finger of right hand 02/04/2017  . Acute pain of left knee 01/14/2017  . Right elbow pain 01/14/2017  . Pes anserinus bursitis 02/15/2014  .  PRURITUS 10/15/2009  . FATIGUE 07/23/2009  . SHOULDER PAIN, RIGHT 06/21/2009  . NECK PAIN 05/06/2009  . BACK PAIN 02/21/2009  . HYPERTENSION 10/02/2008  . KNEE PAIN, RIGHT 09/27/2008  . FOLLICULITIS 07/29/2008  . UNSPECIFIED VAGINITIS AND VULVOVAGINITIS 01/04/2008  . 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 OBESITY, UNSPECIFIED 11/11/2007  . ALLERGIC RHINITIS, SEASONAL 11/11/2007  . A C L SPRAIN-CHRONIC 06/23/2007  . TEAR A C L 06/23/2007   Past Medical History:  Diagnosis Date  . Carpal tunnel syndrome     No family history on file.  Past Surgical History:  Procedure Laterality Date  . ANTERIOR CRUCIATE LIGAMENT REPAIR Left   . CARPAL TUNNEL RELEASE Right 04/01/2015   Procedure: Right Carpal Tunnel Release;  Surgeon: Eldred MangesMark C Kayhan Boardley, MD;  Location: [facility];  Service: Orthopedics;  Laterality: Right;  . CHOLECYSTECTOMY    . EXCISION METACARPAL MASS Right 04/01/2015   Procedure: Excision Right Long Finger Mass;  Surgeon: Eldred MangesMark C Kaylla Cobos, MD;  Location: [facility];  Service: Orthopedics;  Laterality: Right;   Social History   Occupational History  . Not on file  Tobacco Use  . Smoking status: Never Smoker  . Smokeless tobacco: Never Used  Substance and Sexual Activity  . Alcohol use: Yes    Comment: social  . Drug use: No  . Sexual activity: Not on file

## 2017-06-24 ENCOUNTER — Ambulatory Visit (INDEPENDENT_AMBULATORY_CARE_PROVIDER_SITE_OTHER): Payer: BLUE CROSS/BLUE SHIELD | Admitting: Orthopaedic Surgery

## 2017-06-24 ENCOUNTER — Encounter (INDEPENDENT_AMBULATORY_CARE_PROVIDER_SITE_OTHER): Payer: Self-pay | Admitting: Orthopaedic Surgery

## 2017-06-24 VITALS — BP 133/84 | HR 58 | Ht 63.5 in | Wt 202.0 lb

## 2017-06-24 DIAGNOSIS — M1712 Unilateral primary osteoarthritis, left knee: Secondary | ICD-10-CM

## 2017-06-24 MED ORDER — MELOXICAM 15 MG PO TABS: 15.0000 mg | ORAL_TABLET | Freq: Every day | ORAL | Status: DC

## 2017-06-24 NOTE — Progress Notes (Signed)
Office Visit Note   Patient: Leah Phillips           Date of Birth: Mar 05, 1971           MRN: [id number] Visit Date: 06/24/2017              Requested by: Kirstie Peri, MD [address] [place], [place] [address] PCP: Kirstie Peri, MD   Assessment & Plan: Visit Diagnoses:  1. Unilateral primary osteoarthritis, left knee     Plan: Due to her MRI scan she has some degeneration in midportion of her ACL graft but has negative pivot shift negative anterior drawer.  There is some extrusion of the medial meniscus with some medial compartment arthritis much more than lateral and patellofemoral.  There is some posterior meniscal tearing particularly in the posterior horn centrally with a radial tear new since the previous MRI scan.  Follow-Up Instructions: Return in about 2 months (around 08/22/2017).   Orders:  No orders of the defined types were placed in this encounter.  Meds ordered this encounter  Medications  . meloxicam (MOBIC) tablet 15 mg    ONE PO DAILY WITH FOOD. REFILL TIMES 4      Procedures: No procedures performed   Clinical Data: No additional findings.   Subjective: Chief Complaint  Patient presents with  . Left Knee - Pain, Follow-up    MRI review    HPI 47 year old female returns with continued problems with left knee pain primarily medially.  She had a history of 12-14 years ago ACL reconstruction.  She has not had instability symptoms but still has catching and popping with pain medially.  She has had intra-articular injections x2 or 3 in the last year with persistent symptoms.  Her knee is not actually locked towards gotten stuck requiring manipulation.  She denies any associated chills or fever no rheumatologic conditions.  Review of Systems 10 point review of systems updated unchanged from last office visit other than as mentioned in HPI.   Objective: Vital Signs: BP 133/84   Pulse (!) 58   Ht 5' 3.5" (1.613 m)   Wt 202 lb (91.6 kg)   BMI 35.22 kg/m    Physical Exam  Constitutional: [name] oriented to person, place, and time. She appears well-developed.  HENT:  Head: Normocephalic.  Right Ear: External ear normal.  Left Ear: External ear normal.  Eyes: Pupils are equal, round, and reactive to light.  Neck: No tracheal deviation present. No thyromegaly present.  Cardiovascular: Normal rate.  Pulmonary/Chest: Effort normal.  Abdominal: Soft.  Neurological: [name] alert and oriented to person, place, and time.  Skin: Skin is warm and dry.  Psychiatric: She has a normal mood and affect. Her behavior is normal.    Ortho Exam patient has negative pivot shift negative anterior drawer left knee.  Is tenderness all along the medial joint line as well as posteriorly.  Distal pulses are intact no pain with hip range of motion negative straight leg raising 90 degrees.  Some patellofemoral crepitus with knee range of motion.  Specialty Comments:  No specialty comments available.  Imaging: MRI scan left knee done at [facility] shows radial tear of the free edge of the central portion of the medial meniscus.  Status post ACL repair with some disc degeneration in the midportion of the graft.  Moderate to moderately severe medial compartment arthritis with some medial meniscus extrusion.  Minimal patellofemoral and lateral compartment degenerative changes noted.   PMFS History: Patient  Active Problem List   Diagnosis Date Noted  . Ganglion cyst of finger of right hand 02/04/2017  . Acute pain of left knee 01/14/2017  . Right elbow pain 01/14/2017  . Pes anserinus bursitis 02/15/2014  . PRURITUS 10/15/2009  . FATIGUE 07/23/2009  . SHOULDER PAIN, RIGHT 06/21/2009  . NECK PAIN 05/06/2009  . BACK PAIN 02/21/2009  . HYPERTENSION 10/02/2008  . KNEE PAIN, RIGHT 09/27/2008  . FOLLICULITIS 07/29/2008  . UNSPECIFIED VAGINITIS AND VULVOVAGINITIS 01/04/2008  . 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 OBESITY, UNSPECIFIED 11/11/2007  .  ALLERGIC RHINITIS, SEASONAL 11/11/2007  . A C L SPRAIN-CHRONIC 06/23/2007  . TEAR A C L 06/23/2007   Past Medical History:  Diagnosis Date  . Carpal tunnel syndrome     No family history on file.  Past Surgical History:  Procedure Laterality Date  . ANTERIOR CRUCIATE LIGAMENT REPAIR Left   . CARPAL TUNNEL RELEASE Right 04/01/2015   Procedure: Right Carpal Tunnel Release;  Surgeon: Eldred MangesMark C Paraskevi Funez, MD;  Location: [facility];  Service: Orthopedics;  Laterality: Right;  . CHOLECYSTECTOMY    . EXCISION METACARPAL MASS Right 04/01/2015   Procedure: Excision Right Long Finger Mass;  Surgeon: Eldred MangesMark C Liann Spaeth, MD;  Location: [facility];  Service: Orthopedics;  Laterality: Right;   Social History   Occupational History  . Not on file  Tobacco Use  . Smoking status: Never Smoker  . Smokeless tobacco: Never Used  Substance and Sexual Activity  . Alcohol use: Yes    Comment: social  . Drug use: No  . Sexual activity: Not on file

## 2017-06-28 ENCOUNTER — Telehealth (INDEPENDENT_AMBULATORY_CARE_PROVIDER_SITE_OTHER): Payer: Self-pay | Admitting: Orthopaedic Surgery

## 2017-06-28 MED ORDER — MELOXICAM 15 MG PO TABS: ORAL_TABLET | ORAL | 4 refills | Status: DC

## 2017-06-28 NOTE — Telephone Encounter (Signed)
Patient said pharmacy has not received the meloxicam that Dr. Ophelia CharterYates called in for her. Can you make sure this was sent to [facility] [place]? Patients CB # [phone]

## 2017-06-28 NOTE — Telephone Encounter (Signed)
Sent to pharmacy. Patient advised.  

## 2017-08-18 DIAGNOSIS — L720 Epidermal cyst: Secondary | ICD-10-CM | POA: Insufficient documentation

## 2017-09-02 ENCOUNTER — Encounter (INDEPENDENT_AMBULATORY_CARE_PROVIDER_SITE_OTHER): Payer: Self-pay | Admitting: Orthopaedic Surgery

## 2017-09-02 ENCOUNTER — Ambulatory Visit (INDEPENDENT_AMBULATORY_CARE_PROVIDER_SITE_OTHER): Payer: Self-pay

## 2017-09-02 ENCOUNTER — Ambulatory Visit (INDEPENDENT_AMBULATORY_CARE_PROVIDER_SITE_OTHER): Payer: BLUE CROSS/BLUE SHIELD | Admitting: Orthopaedic Surgery

## 2017-09-02 VITALS — BP 118/81 | HR 79

## 2017-09-02 DIAGNOSIS — M79671 Pain in right foot: Secondary | ICD-10-CM | POA: Diagnosis not present

## 2017-09-02 DIAGNOSIS — M21611 Bunion of right foot: Secondary | ICD-10-CM

## 2017-09-02 NOTE — Progress Notes (Signed)
Office Visit Note   Patient: Leah Phillips           Date of Birth: 11-21-1970           MRN: [id number] Visit Date: 09/02/2017              Requested by: Kirstie PeriShah, Ashish, MD [address] [place], [place] [id number] PCP: Kirstie PeriShah, Ashish, MD   Assessment & Plan: Visit Diagnoses:  1. Pain in right foot   2. Bunion of great toe of right foot     Plan: Patient has painful bunion medially with hallux valgus as well as dorsal bunion.  [name] tried different shoes has had problems with this persistently over several years.  X-rays demonstrate progression of the bunion deformity and hallux valgus compared x-rays 2014.  Surgery on opposite left foot which is doing well.  Patient states she like to proceed with the surgical correction of her painful bunion deformity.  This would require distal metatarsal chevron osteotomy with pinning.  Previous foot she was out of work for 2-3 months.  Procedure discussed of risks including nonunion  infection , and recurrence discussed.  Understands and requests we proceed.  Discussion for surgery was held today.  We reviewed previous x-rays from 2014 from her left foot prior to her corrective surgery.  Be outpatient procedure with pin fixation.  She keep her foot elevated after the surgery she has crutches at home which she is proficient with.  Follow-Up Instructions: No follow-ups on file.   Orders:  Orders Placed This Encounter  Procedures  . XR Foot Complete Right   No orders of the defined types were placed in this encounter.     Procedures: No procedures performed   Clinical Data: No additional findings.   Subjective: Chief Complaint  Patient presents with  . Left Knee - Follow-up  . Right Foot - Pain    [id number] -year-old female returns he is having increased problems with a painful right bunion both medial and dorsally.  [name] having problems with shoe wear.  It is at home she can only wear slippers.  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  Tunnel release 2016 doing well.  Left chevron bunionectomy and osteotomy done in thousand 14 with good relief of pain.  Neurologic conditions.  Previous right cubital tunnel release for cubital tunnel syndrome doing well.  Review of Systems team point review of systems updated unchanged other than as mentioned above with the exception of recent sebaceous cyst x2 removal on her back.   Objective: Vital Signs: BP 118/81   Pulse 79   Physical Exam  Constitutional: [name] oriented to person, place, and time. She appears well-developed.  HENT:  Head: Normocephalic.  Right Ear: External ear normal.  Left Ear: External ear normal.  Eyes: Pupils are equal, round, and reactive to light.  Neck: No tracheal deviation present. No thyromegaly present.  Cardiovascular: Normal rate.  Pulmonary/Chest: Effort normal.  Abdominal: Soft.  Neurological: [name] alert and oriented to person, place, and time.  Skin: Skin is warm and dry.  Psychiatric: She has a normal mood and affect. Her behavior is normal.    Ortho Exam tenderness with palpation over the right foot medial bunion as well as palpable dorsal bunion which is tender.  Extensor is intact.  She has metatarsus varus and hallux valgus.  Specialty Comments:  No specialty comments available.  Imaging: Xr Foot Complete Right  Result Date: 09/02/2017  Three-view x-rays right foot obtained and reviewed.  This shows hallux valgus measuring degrees.  Medial bunion dorsal bunion noted.  First second intermetatarsal angle is 9 degrees.  Negative for acute changes.   impression : hallux valgus with metatarsus adductus.  No evidence of stress fracture.    PMFS History: Patient Active Problem List   Diagnosis Date Noted  . Bunion of great toe of right foot 09/02/2017  . Ganglion cyst of finger of right hand 02/04/2017  . Acute pain of left knee 01/14/2017  . Right elbow pain 01/14/2017  . Pes anserinus bursitis 02/15/2014  .  PRURITUS 10/15/2009  . FATIGUE 07/23/2009  . SHOULDER PAIN, RIGHT 06/21/2009  . NECK PAIN 05/06/2009  . BACK PAIN 02/21/2009  . HYPERTENSION 10/02/2008  . KNEE PAIN, RIGHT 09/27/2008  . FOLLICULITIS 07/29/2008  . UNSPECIFIED VAGINITIS AND VULVOVAGINITIS 01/04/2008  . 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 OBESITY, UNSPECIFIED 11/11/2007  . ALLERGIC RHINITIS, SEASONAL 11/11/2007  . A C L SPRAIN-CHRONIC 06/23/2007  . TEAR A C L 06/23/2007   Past Medical History:  Diagnosis Date  . Carpal tunnel syndrome     No family history on file.  Past Surgical History:  Procedure Laterality Date  . ANTERIOR CRUCIATE LIGAMENT REPAIR Left   . CARPAL TUNNEL RELEASE Right 04/01/2015   Procedure: Right Carpal Tunnel Release;  Surgeon: Eldred Manges, MD;  Location: [facility];  Service: Orthopedics;  Laterality: Right;  . CHOLECYSTECTOMY    . EXCISION METACARPAL MASS Right 04/01/2015   Procedure: Excision Right Long Finger Mass;  Surgeon: Eldred Manges, MD;  Location: [facility];  Service: Orthopedics;  Laterality: Right;   Social History   Occupational History  . Not on file  Tobacco Use  . Smoking status: Never Smoker  . Smokeless tobacco: Never Used  Substance and Sexual Activity  . Alcohol use: Yes    Comment: social  . Drug use: No  . Sexual activity: Not on file

## 2017-09-19 ENCOUNTER — Other Ambulatory Visit (INDEPENDENT_AMBULATORY_CARE_PROVIDER_SITE_OTHER): Payer: Self-pay | Admitting: Orthopaedic Surgery

## 2017-09-20 ENCOUNTER — Encounter: Payer: Self-pay | Admitting: Orthopaedic Surgery

## 2017-09-20 DIAGNOSIS — M2011 Hallux valgus (acquired), right foot: Secondary | ICD-10-CM | POA: Diagnosis not present

## 2017-09-20 NOTE — Telephone Encounter (Signed)
Please advise on refill.

## 2017-09-21 ENCOUNTER — Telehealth (INDEPENDENT_AMBULATORY_CARE_PROVIDER_SITE_OTHER): Payer: Self-pay | Admitting: Orthopaedic Surgery

## 2017-09-21 MED ORDER — TIZANIDINE HCL 2 MG PO CAPS: 4.0000 mg | ORAL_CAPSULE | Freq: Three times a day (TID) | ORAL | 0 refills | Status: DC | PRN

## 2017-09-21 NOTE — Telephone Encounter (Signed)
Please advise. OK for refill? 

## 2017-09-21 NOTE — Telephone Encounter (Signed)
Sent to pharmacy 

## 2017-09-21 NOTE — Telephone Encounter (Signed)
Ok thanks 

## 2017-09-21 NOTE — Telephone Encounter (Signed)
Patient called needing Rx refilled (Tizanidine HCL) The number to contact patient is [phone]. [facility] [place]

## 2017-09-23 ENCOUNTER — Telehealth (INDEPENDENT_AMBULATORY_CARE_PROVIDER_SITE_OTHER): Payer: Self-pay | Admitting: Orthopaedic Surgery

## 2017-09-23 ENCOUNTER — Ambulatory Visit (INDEPENDENT_AMBULATORY_CARE_PROVIDER_SITE_OTHER): Payer: BLUE CROSS/BLUE SHIELD | Admitting: Orthopaedic Surgery

## 2017-09-23 ENCOUNTER — Encounter (INDEPENDENT_AMBULATORY_CARE_PROVIDER_SITE_OTHER): Payer: Self-pay | Admitting: Orthopaedic Surgery

## 2017-09-23 DIAGNOSIS — M21611 Bunion of right foot: Secondary | ICD-10-CM

## 2017-09-23 NOTE — Telephone Encounter (Signed)
Patient called advised the wrap is coming off of her right foot and need to rewrapped. Patient also advised she need to see Dr. Ophelia CharterYates before 09/30/16 to discuss how long she will be out of work and to get her FLMA paperwork signed,completed and faxed back to her employer and insurance company. [facility] [place] [facility] [place] CharterYates.  Patient said she will also need a note fr being out of work and when she is expected to return. The number to contact patient is [phone]

## 2017-09-23 NOTE — Telephone Encounter (Signed)
I called patient. She will be worked in to schedule today in [place] office at [employer]3pm.  Can you please add to schedule and arrive?  Thanks.

## 2017-09-23 NOTE — Progress Notes (Signed)
   Post-Op Visit Note   Patient: Leah Phillips           Date of Birth: 11-24-1970           MRN: [id number] Visit Date: 09/23/2017 PCP: Kirstie PeriShah, Ashish, MD   Assessment & Plan: Post bunion surgery with chevron osteotomy right foot on 09/20/2017.  Dressing change Steri-Strips changed.  No cellulitis.  [name] keeping her foot elevated.  Chief Complaint: No chief complaint on file.  Visit Diagnoses:  1. Bunion of great toe of right foot     Plan: Return 2 weeks.  Work slip given no work x2 months.  Return in 2 weeks for x-rays right foot.  Follow-Up Instructions: Return in about 2 weeks (around 10/07/2017).   Orders:  No orders of the defined types were placed in this encounter.  No orders of the defined types were placed in this encounter.   Imaging: No results found.  PMFS History: Patient Active Problem List   Diagnosis Date Noted  . Bunion of great toe of right foot 09/02/2017  . Ganglion cyst of finger of right hand 02/04/2017  . Acute pain of left knee 01/14/2017  . Right elbow pain 01/14/2017  . Pes anserinus bursitis 02/15/2014  . PRURITUS 10/15/2009  . FATIGUE 07/23/2009  . SHOULDER PAIN, RIGHT 06/21/2009  . NECK PAIN 05/06/2009  . BACK PAIN 02/21/2009  . HYPERTENSION 10/02/2008  . KNEE PAIN, RIGHT 09/27/2008  . FOLLICULITIS 07/29/2008  . UNSPECIFIED VAGINITIS AND VULVOVAGINITIS 01/04/2008  . 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 OBESITY, UNSPECIFIED 11/11/2007  . ALLERGIC RHINITIS, SEASONAL 11/11/2007  . A C L SPRAIN-CHRONIC 06/23/2007  . TEAR A C L 06/23/2007   Past Medical History:  Diagnosis Date  . Carpal tunnel syndrome     No family history on file.  Past Surgical History:  Procedure Laterality Date  . ANTERIOR CRUCIATE LIGAMENT REPAIR Left   . CARPAL TUNNEL RELEASE Right 04/01/2015   Procedure: Right Carpal Tunnel Release;  Surgeon: Eldred MangesMark C Rahcel Shutes, MD;  Location: [facility];  Service: Orthopedics;  Laterality: Right;  .  CHOLECYSTECTOMY    . EXCISION METACARPAL MASS Right 04/01/2015   Procedure: Excision Right Long Finger Mass;  Surgeon: Eldred MangesMark C Loany Neuroth, MD;  Location: [facility];  Service: Orthopedics;  Laterality: Right;   Social History   Occupational History  . Not on file  Tobacco Use  . Smoking status: Never Smoker  . Smokeless tobacco: Never Used  Substance and Sexual Activity  . Alcohol use: Yes    Comment: social  . Drug use: No  . Sexual activity: Not on file

## 2017-09-30 ENCOUNTER — Inpatient Hospital Stay (INDEPENDENT_AMBULATORY_CARE_PROVIDER_SITE_OTHER): Payer: BLUE CROSS/BLUE SHIELD | Admitting: Orthopaedic Surgery

## 2017-10-03 ENCOUNTER — Other Ambulatory Visit (INDEPENDENT_AMBULATORY_CARE_PROVIDER_SITE_OTHER): Payer: Self-pay | Admitting: Orthopaedic Surgery

## 2017-10-04 NOTE — Telephone Encounter (Signed)
OK for refill.

## 2017-10-07 ENCOUNTER — Ambulatory Visit (INDEPENDENT_AMBULATORY_CARE_PROVIDER_SITE_OTHER): Payer: BLUE CROSS/BLUE SHIELD | Admitting: Orthopaedic Surgery

## 2017-10-07 ENCOUNTER — Ambulatory Visit (INDEPENDENT_AMBULATORY_CARE_PROVIDER_SITE_OTHER): Payer: BLUE CROSS/BLUE SHIELD

## 2017-10-07 ENCOUNTER — Encounter (INDEPENDENT_AMBULATORY_CARE_PROVIDER_SITE_OTHER): Payer: Self-pay | Admitting: Orthopaedic Surgery

## 2017-10-07 VITALS — BP 123/86 | HR 75 | Ht 63.5 in | Wt 202.0 lb

## 2017-10-07 DIAGNOSIS — M21611 Bunion of right foot: Secondary | ICD-10-CM

## 2017-10-07 NOTE — Progress Notes (Signed)
   Post-Op Visit Note   Patient: Leah Phillips           Date of Birth: September 22, 1970           MRN: [id number] Visit Date: 10/07/2017 PCP: Kirstie Peri, MD   Assessment & Plan:  Chief Complaint:  Chief Complaint  Patient presents with  . Right Foot - Routine Post Op   Visit Diagnoses:  1. Bunion of great toe of right foot     Plan: Return in 2 weeks for K wire removal.  Steri-Strips are changed today and suture was removed.Marland KitchenShe will not need xrays on 2 wk followup.  Norco 30 tablets 5/325 1 p.o. every 8 prescribed.  Follow-Up Instructions: Return in about 2 weeks (around 10/21/2017).   Orders:  Orders Placed This Encounter  Procedures  . XR Foot Complete Right   No orders of the defined types were placed in this encounter.   Imaging: No results found.  PMFS History: Patient Active Problem List   Diagnosis Date Noted  . Bunion of great toe of right foot 09/02/2017  . Ganglion cyst of finger of right hand 02/04/2017  . Acute pain of left knee 01/14/2017  . Right elbow pain 01/14/2017  . Pes anserinus bursitis 02/15/2014  . PRURITUS 10/15/2009  . FATIGUE 07/23/2009  . SHOULDER PAIN, RIGHT 06/21/2009  . NECK PAIN 05/06/2009  . BACK PAIN 02/21/2009  . HYPERTENSION 10/02/2008  . KNEE PAIN, RIGHT 09/27/2008  . FOLLICULITIS 07/29/2008  . UNSPECIFIED VAGINITIS AND VULVOVAGINITIS 01/04/2008  . 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 OBESITY, UNSPECIFIED 11/11/2007  . ALLERGIC RHINITIS, SEASONAL 11/11/2007  . A C L SPRAIN-CHRONIC 06/23/2007  . TEAR A C L 06/23/2007   Past Medical History:  Diagnosis Date  . Carpal tunnel syndrome     No family history on file.  Past Surgical History:  Procedure Laterality Date  . ANTERIOR CRUCIATE LIGAMENT REPAIR Left   . CARPAL TUNNEL RELEASE Right 04/01/2015   Procedure: Right Carpal Tunnel Release;  Surgeon: Eldred Manges, MD;  Location: [facility];  Service: Orthopedics;  Laterality: Right;  .  CHOLECYSTECTOMY    . EXCISION METACARPAL MASS Right 04/01/2015   Procedure: Excision Right Long Finger Mass;  Surgeon: Eldred Manges, MD;  Location: [facility];  Service: Orthopedics;  Laterality: Right;   Social History   Occupational History  . Not on file  Tobacco Use  . Smoking status: Never Smoker  . Smokeless tobacco: Never Used  Substance and Sexual Activity  . Alcohol use: Yes    Comment: social  . Drug use: No  . Sexual activity: Not on file

## 2017-10-08 MED ORDER — HYDROCODONE-ACETAMINOPHEN 5-325 MG PO TABS: 1.0000 | ORAL_TABLET | Freq: Three times a day (TID) | ORAL | 0 refills | Status: DC | PRN

## 2017-10-08 NOTE — Addendum Note (Signed)
Addended by: Rogers Seeds on: 10/08/2017 09:40 AM   Modules accepted: Orders

## 2017-10-13 ENCOUNTER — Encounter (INDEPENDENT_AMBULATORY_CARE_PROVIDER_SITE_OTHER): Payer: Self-pay | Admitting: Orthopaedic Surgery

## 2017-10-21 ENCOUNTER — Inpatient Hospital Stay (INDEPENDENT_AMBULATORY_CARE_PROVIDER_SITE_OTHER): Payer: BLUE CROSS/BLUE SHIELD | Admitting: Orthopaedic Surgery

## 2017-10-28 ENCOUNTER — Ambulatory Visit (INDEPENDENT_AMBULATORY_CARE_PROVIDER_SITE_OTHER): Payer: Self-pay

## 2017-10-28 ENCOUNTER — Ambulatory Visit (INDEPENDENT_AMBULATORY_CARE_PROVIDER_SITE_OTHER): Payer: BLUE CROSS/BLUE SHIELD | Admitting: Orthopaedic Surgery

## 2017-10-28 ENCOUNTER — Encounter (INDEPENDENT_AMBULATORY_CARE_PROVIDER_SITE_OTHER): Payer: Self-pay | Admitting: Orthopaedic Surgery

## 2017-10-28 VITALS — BP 135/91 | HR 87 | Ht 63.0 in | Wt 198.0 lb

## 2017-10-28 DIAGNOSIS — M21611 Bunion of right foot: Secondary | ICD-10-CM

## 2017-10-28 NOTE — Progress Notes (Signed)
   Post-Op Visit Note   Patient: Leah Phillips           Date of Birth: 14-Jan-1971           MRN: [id number] Visit Date: 10/28/2017 PCP: Kirstie Peri, MD   Assessment & Plan: See below  Chief Complaint:  Chief Complaint  Patient presents with  . Right Foot - Routine Post Op, Follow-up   Visit Diagnoses:  1. Bunion of great toe of right foot     Plan: K wires removed x-rays show good position alignment she is having pain with motion of the toe and this may be related to the pin bothering the flexor tendon.  Pins were loose and were removed.  She can work on toe motion gradually work her way off the crutches and back into regular shoe over the next couple weeks.  Work slip given no work x3 weeks.  Recheck 4 weeks.  Follow-Up Instructions: Return in about 1 month (around 11/25/2017).   Orders:  Orders Placed This Encounter  Procedures  . XR Foot 2 Views Right   No orders of the defined types were placed in this encounter.   Imaging: Xr Foot 2 Views Right  Result Date: 10/28/2017 3 views right foot obtained.  Pins have been removed post chevron osteotomy and bunionectomy.  Interval healing of the osteotomy is noted. Impression: Post right foot distal metatarsal osteotomy.  Good position and alignment.   PMFS History: Patient Active Problem List   Diagnosis Date Noted  . Bunion of great toe of right foot 09/02/2017  . Ganglion cyst of finger of right hand 02/04/2017  . Acute pain of left knee 01/14/2017  . Right elbow pain 01/14/2017  . Pes anserinus bursitis 02/15/2014  . PRURITUS 10/15/2009  . FATIGUE 07/23/2009  . SHOULDER PAIN, RIGHT 06/21/2009  . NECK PAIN 05/06/2009  . BACK PAIN 02/21/2009  . HYPERTENSION 10/02/2008  . KNEE PAIN, RIGHT 09/27/2008  . FOLLICULITIS 07/29/2008  . UNSPECIFIED VAGINITIS AND VULVOVAGINITIS 01/04/2008  . 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 OBESITY, UNSPECIFIED 11/11/2007  . ALLERGIC RHINITIS, SEASONAL 11/11/2007  . A C L  SPRAIN-CHRONIC 06/23/2007  . TEAR A C L 06/23/2007   Past Medical History:  Diagnosis Date  . Carpal tunnel syndrome     History reviewed. No pertinent family history.  Past Surgical History:  Procedure Laterality Date  . ANTERIOR CRUCIATE LIGAMENT REPAIR Left   . CARPAL TUNNEL RELEASE Right 04/01/2015   Procedure: Right Carpal Tunnel Release;  Surgeon: Eldred Manges, MD;  Location: [facility];  Service: Orthopedics;  Laterality: Right;  . CHOLECYSTECTOMY    . EXCISION METACARPAL MASS Right 04/01/2015   Procedure: Excision Right Long Finger Mass;  Surgeon: Eldred Manges, MD;  Location: [facility];  Service: Orthopedics;  Laterality: Right;   Social History   Occupational History  . Not on file  Tobacco Use  . Smoking status: Never Smoker  . Smokeless tobacco: Never Used  Substance and Sexual Activity  . Alcohol use: Yes    Comment: social  . Drug use: No  . Sexual activity: Not on file

## 2017-11-02 ENCOUNTER — Telehealth (INDEPENDENT_AMBULATORY_CARE_PROVIDER_SITE_OTHER): Payer: Self-pay | Admitting: Orthopaedic Surgery

## 2017-11-02 NOTE — Telephone Encounter (Signed)
Med refill  Hydrocodone-acetaminophen (Norco/Vicodin

## 2017-11-02 NOTE — Telephone Encounter (Signed)
Please advise 

## 2017-11-03 NOTE — Telephone Encounter (Signed)
Time to wean pain meds. OK ultram # 20 one po bid prn pain. After that tylenol tid prn thanks

## 2017-11-04 ENCOUNTER — Telehealth (INDEPENDENT_AMBULATORY_CARE_PROVIDER_SITE_OTHER): Payer: Self-pay | Admitting: Orthopaedic Surgery

## 2017-11-04 MED ORDER — TRAMADOL HCL 50 MG PO TABS: 50.0000 mg | ORAL_TABLET | Freq: Two times a day (BID) | ORAL | 0 refills | Status: DC | PRN

## 2017-11-04 NOTE — Telephone Encounter (Signed)
Please advise 

## 2017-11-04 NOTE — Telephone Encounter (Signed)
Patient called wanting to let Dr. Ophelia Charter know that the Tramadol makes her hallucinate and she would like to be prescribed something else instead. CB # [phone]

## 2017-11-04 NOTE — Telephone Encounter (Signed)
Selana-pharmacy tech with [facility] called needing clarification and diag. on Rx for (Tramadol). The number to contact Selana is [phone] opt. 0

## 2017-11-04 NOTE — Telephone Encounter (Signed)
I left voicemail for patient advising. Tramadol called to [facility] pharmacy.

## 2017-11-05 NOTE — Telephone Encounter (Signed)
Ok then use tylenol and also can use aleve 2 po bid with food.

## 2017-11-08 NOTE — Telephone Encounter (Signed)
I called pharmacy. Advised not to fill due to patient stating she had hallucinations with medication.

## 2017-11-08 NOTE — Telephone Encounter (Signed)
I left voicemail for patient advising. 

## 2017-11-18 ENCOUNTER — Ambulatory Visit (INDEPENDENT_AMBULATORY_CARE_PROVIDER_SITE_OTHER): Payer: BLUE CROSS/BLUE SHIELD | Admitting: Orthopaedic Surgery

## 2017-11-18 ENCOUNTER — Encounter (INDEPENDENT_AMBULATORY_CARE_PROVIDER_SITE_OTHER): Payer: Self-pay | Admitting: Orthopaedic Surgery

## 2017-11-18 VITALS — BP 124/81 | HR 69 | Ht 63.0 in | Wt 198.0 lb

## 2017-11-18 DIAGNOSIS — M21611 Bunion of right foot: Secondary | ICD-10-CM

## 2017-11-18 NOTE — Progress Notes (Signed)
   Post-Op Visit Note   Patient: Leah Phillips           Date of Birth: 1971-01-12           MRN: [id number] Visit Date: 11/18/2017 PCP: Kirstie PeriShah, Ashish, MD   Assessment & Plan: Patient returns post right foot bunion still having soreness mild swelling.  Incision is well-healed.  Last x-rays 10/28/2017 showed good position and alignment.  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  Does have some Voltaren cream that she is to apply to her lateral epicondyle of her elbow on the right which she still has and can use it for her foot.  Chief Complaint:  Chief Complaint  Patient presents with  . Right Foot - Routine Post Op   Visit Diagnoses:  1. Bunion of great toe of right foot     Plan: Work slip given no work x1 month recheck 1 month.  [name] having persistent soreness and is not walking well we will repeat x-rays right foot 3 views on return.  Follow-Up Instructions: Return in about 1 month (around 12/18/2017).   Orders:  No orders of the defined types were placed in this encounter.  No orders of the defined types were placed in this encounter.   Imaging: No results found.  PMFS History: Patient Active Problem List   Diagnosis Date Noted  . Bunion of great toe of right foot 09/02/2017  . Ganglion cyst of finger of right hand 02/04/2017  . Acute pain of left knee 01/14/2017  . Right elbow pain 01/14/2017  . Pes anserinus bursitis 02/15/2014  . PRURITUS 10/15/2009  . FATIGUE 07/23/2009  . SHOULDER PAIN, RIGHT 06/21/2009  . NECK PAIN 05/06/2009  . BACK PAIN 02/21/2009  . HYPERTENSION 10/02/2008  . KNEE PAIN, RIGHT 09/27/2008  . FOLLICULITIS 07/29/2008  . UNSPECIFIED VAGINITIS AND VULVOVAGINITIS 01/04/2008  . 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 OBESITY, UNSPECIFIED 11/11/2007  . ALLERGIC RHINITIS, SEASONAL 11/11/2007  . A C L SPRAIN-CHRONIC 06/23/2007  . TEAR A C L 06/23/2007   Past  Medical History:  Diagnosis Date  . Carpal tunnel syndrome     No family history on file.  Past Surgical History:  Procedure Laterality Date  . ANTERIOR CRUCIATE LIGAMENT REPAIR Left   . CARPAL TUNNEL RELEASE Right 04/01/2015   Procedure: Right Carpal Tunnel Release;  Surgeon: Eldred MangesMark C Nathanyl Andujo, MD;  Location: [facility];  Service: Orthopedics;  Laterality: Right;  . CHOLECYSTECTOMY    . EXCISION METACARPAL MASS Right 04/01/2015   Procedure: Excision Right Long Finger Mass;  Surgeon: Eldred MangesMark C Eddie Payette, MD;  Location: [facility];  Service: Orthopedics;  Laterality: Right;   Social History   Occupational History  . Not on file  Tobacco Use  . Smoking status: Never Smoker  . Smokeless tobacco: Never Used  Substance and Sexual Activity  . Alcohol use: Yes    Comment: social  . Drug use: No  . Sexual activity: Not on file

## 2017-12-16 ENCOUNTER — Encounter (INDEPENDENT_AMBULATORY_CARE_PROVIDER_SITE_OTHER): Payer: Self-pay | Admitting: Orthopaedic Surgery

## 2017-12-16 ENCOUNTER — Ambulatory Visit (INDEPENDENT_AMBULATORY_CARE_PROVIDER_SITE_OTHER): Payer: BLUE CROSS/BLUE SHIELD | Admitting: Orthopaedic Surgery

## 2017-12-16 ENCOUNTER — Ambulatory Visit (INDEPENDENT_AMBULATORY_CARE_PROVIDER_SITE_OTHER): Payer: Self-pay

## 2017-12-16 VITALS — BP 118/84 | HR 82 | Ht 63.0 in | Wt 198.0 lb

## 2017-12-16 DIAGNOSIS — M21611 Bunion of right foot: Secondary | ICD-10-CM | POA: Diagnosis not present

## 2017-12-16 DIAGNOSIS — M67441 Ganglion, right hand: Secondary | ICD-10-CM | POA: Diagnosis not present

## 2017-12-16 MED ORDER — LIDOCAINE HCL 1 % IJ SOLN
0.5000 mL | INTRAMUSCULAR | Status: AC | PRN
Start: 2017-12-16 — End: 2017-12-16
  Administered 2017-12-16: .5 mL

## 2017-12-16 NOTE — Progress Notes (Signed)
Office Visit Note   Patient: Leah Phillips           Date of Birth: 12-23-1970           MRN: [id number] Visit Date: 12/16/2017              Requested by: Kirstie Peri, MD [address] [place], [place] [address] PCP: Kirstie Peri, MD   Assessment & Plan: Visit Diagnoses:  1. Bunion of great toe of right foot   2. Ganglion cyst of finger of right hand     Plan: Patient's ganglion cyst recurred and it was aspirated today.  Passes are made to the cyst but no ganglion fluid was obtained.  [name] almost back in to a regular shoe still has slight swelling.  Work slip given for work resumption at the start of August.  I plan to recheck her in a month to recheck her finger cyst.  Follow-Up Instructions: Return in about 1 month (around 01/16/2018), or if symptoms worsen or fail to improve.   Orders:  Orders Placed This Encounter  Procedures  . Hand/UE Inj  . Small Joint Inj  . XR Foot Complete Right   No orders of the defined types were placed in this encounter.     Procedures: Small Joint Inj on 12/16/2017 3:37 PM Indications: pain Details: 25 G needle, ulnar approach  Spinal Needle: No  Medications: 0.5 mL lidocaine 1 % Outcome: tolerated well, no immediate complications Procedure, treatment alternatives, risks and benefits explained, specific risks discussed. Consent was given by the patient. Immediately prior to procedure a time out was called to verify the correct patient, procedure, equipment, support staff and site/side marked as required. Patient was prepped and draped in the usual sterile fashion.       Clinical Data: No additional findings.   Subjective: Chief Complaint  Patient presents with  . Right Foot - Follow-up    HPI follow-up first metatarsal distal bunionectomy with chevron osteotomy.  Swelling is down she is almost able to get her foot and issue she has to take it off after short period time due to swelling.  [name] had recurrence of the cyst at the base  of the right long finger on the ulnar aspect.  Incisions well-healed but the cyst is palpable again.  Review of Systems Unchanged from recent surgery date.  Objective: Vital Signs: BP 118/84   Pulse 82   Ht 5\' 3"  (1.6 m)   Wt 198 lb (89.8 kg)   BMI 35.07 kg/m   Physical Exam  Constitutional: [name] oriented to person, place, and time. She appears well-developed.  HENT:  Head: Normocephalic.  Right Ear: External ear normal.  Left Ear: External ear normal.  Eyes: Pupils are equal, round, and reactive to light.  Neck: No tracheal deviation present. No thyromegaly present.  Cardiovascular: Normal rate.  Pulmonary/Chest: Effort normal.  Abdominal: Soft.  Neurological: [name] alert and oriented to person, place, and time.  Skin: Skin is warm and dry.  Psychiatric: She has a normal mood and affect. Her behavior is normal.    Ortho Exam some recurrence of cyst adjacent to the incision right long finger along the ulnar aspect adjacent to the proximal finger crease.  She has tenderness with palpation.  And incisions well-healed still mild swelling mild tenderness.  Specialty Comments:  No specialty comments available.  Imaging: Xr Foot Complete Right  Result Date: 12/16/2017 X-rays right foot show satisfactory bunion resection with chevron bunionectomy  well-healed. Impression: Post distal first metatarsal osteotomy healed with exostosis excision.    PMFS History: Patient Active Problem List   Diagnosis Date Noted  . Bunion of great toe of right foot 09/02/2017  . Ganglion cyst of finger of right hand 02/04/2017  . Acute pain of left knee 01/14/2017  . Right elbow pain 01/14/2017  . Pes anserinus bursitis 02/15/2014  . PRURITUS 10/15/2009  . FATIGUE 07/23/2009  . SHOULDER PAIN, RIGHT 06/21/2009  . NECK PAIN 05/06/2009  . BACK PAIN 02/21/2009  . HYPERTENSION 10/02/2008  . KNEE PAIN, RIGHT 09/27/2008  . FOLLICULITIS 07/29/2008  . UNSPECIFIED VAGINITIS AND VULVOVAGINITIS  01/04/2008  . 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 OBESITY, UNSPECIFIED 11/11/2007  . ALLERGIC RHINITIS, SEASONAL 11/11/2007  . A C L SPRAIN-CHRONIC 06/23/2007  . TEAR A C L 06/23/2007   Past Medical History:  Diagnosis Date  . Carpal tunnel syndrome     No family history on file.  Past Surgical History:  Procedure Laterality Date  . ANTERIOR CRUCIATE LIGAMENT REPAIR Left   . CARPAL TUNNEL RELEASE Right 04/01/2015   Procedure: Right Carpal Tunnel Release;  Surgeon: Eldred MangesMark C Hannibal Skalla, MD;  Location: [facility];  Service: Orthopedics;  Laterality: Right;  . CHOLECYSTECTOMY    . EXCISION METACARPAL MASS Right 04/01/2015   Procedure: Excision Right Long Finger Mass;  Surgeon: Eldred MangesMark C Jaaron Oleson, MD;  Location: [facility];  Service: Orthopedics;  Laterality: Right;   Social History   Occupational History  . Not on file  Tobacco Use  . Smoking status: Never Smoker  . Smokeless tobacco: Never Used  Substance and Sexual Activity  . Alcohol use: Yes    Comment: social  . Drug use: No  . Sexual activity: Not on file

## 2018-01-31 ENCOUNTER — Other Ambulatory Visit (INDEPENDENT_AMBULATORY_CARE_PROVIDER_SITE_OTHER): Payer: Self-pay | Admitting: Orthopaedic Surgery

## 2018-01-31 NOTE — Telephone Encounter (Signed)
I called and spoke with patient. She requests Ibuprofen 800mg  be filled. Sent to pharmacy.

## 2018-01-31 NOTE — Telephone Encounter (Signed)
Ok for refill? 

## 2018-01-31 NOTE — Telephone Encounter (Signed)
[facility]. She can pick one or the other but not both. thanks

## 2018-01-31 NOTE — Telephone Encounter (Signed)
OK thanks go with Motrin.

## 2018-02-10 ENCOUNTER — Ambulatory Visit (INDEPENDENT_AMBULATORY_CARE_PROVIDER_SITE_OTHER): Payer: Self-pay

## 2018-02-10 ENCOUNTER — Ambulatory Visit (INDEPENDENT_AMBULATORY_CARE_PROVIDER_SITE_OTHER): Payer: BLUE CROSS/BLUE SHIELD | Admitting: Orthopaedic Surgery

## 2018-02-10 ENCOUNTER — Encounter (INDEPENDENT_AMBULATORY_CARE_PROVIDER_SITE_OTHER): Payer: Self-pay | Admitting: Orthopaedic Surgery

## 2018-02-10 VITALS — BP 133/87 | HR 77 | Ht 63.0 in | Wt 207.0 lb

## 2018-02-10 DIAGNOSIS — M67911 Unspecified disorder of synovium and tendon, right shoulder: Secondary | ICD-10-CM

## 2018-02-10 DIAGNOSIS — M67813 Other specified disorders of tendon, right shoulder: Secondary | ICD-10-CM

## 2018-02-10 DIAGNOSIS — M25511 Pain in right shoulder: Secondary | ICD-10-CM | POA: Diagnosis not present

## 2018-02-10 NOTE — Progress Notes (Signed)
Office Visit Note   Patient: Leah Phillips           Date of Birth: May 14, 1971           MRN: [id number] Visit Date: 02/10/2018              Requested by: Kirstie Peri, MD [address] [place], [place] [address] PCP: Kirstie Peri, MD   Assessment & Plan: Visit Diagnoses:  1. Acute pain of right shoulder   2. Biceps tendinosis of right shoulder     Plan: Injection performed which gave her good relief of her pain.  Office follow-up if symptoms persist.  Follow-Up Instructions: Return if symptoms worsen or fail to improve.   Orders:  Orders Placed This Encounter  Procedures  . Medium Joint Inj  . Hand/UE Inj  . Medium Joint Inj  . Large Joint Inj  . XR Shoulder Right   No orders of the defined types were placed in this encounter.     Procedures: Large Joint Inj: R glenohumeral on 02/10/2018 3:23 PM Indications: pain Details: 22 G 1.5 in needle, anterior approach  Arthrogram: No  Medications: 40 mg methylPREDNISolone acetate 40 MG/ML; 0.5 mL lidocaine 1 %; 2 mL bupivacaine 0.25 % Outcome: tolerated well, no immediate complications Procedure, treatment alternatives, risks and benefits explained, specific risks discussed. Consent was given by the patient. Immediately prior to procedure a time out was called to verify the correct patient, procedure, equipment, support staff and site/side marked as required. Patient was prepped and draped in the usual sterile fashion.       Clinical Data: No additional findings.   Subjective: Chief Complaint  Patient presents with  . Right Shoulder - Pain    HPI patient pain and ceiling of several rooms overhead using her right dominant arm and is having significant anterior shoulder pain pain with outstretched reaching pain with pulling and points directly over the biceps tendon.  She had similar problem with this only to a milder degree many years ago.  She states her bunion surgery is doing well knee and ulnar nerve on the right at the  elbow doing well no numbness tingling in her hand no associated neck pain.  No chills or fever.  Patient is back working but states it bothers her when she works.  Review of Systems point review of systems updated unchanged as pertains HPI.  Previous treatment for Baker's cyst cubital tunnel on the right bilateral bunion chevron osteotomies.   Objective: Vital Signs: BP 133/87   Pulse 77   Ht 5\' 3"  (1.6 m)   Wt 207 lb (93.9 kg)   BMI 36.67 kg/m   Physical Exam  Constitutional: [name] oriented to person, place, and time. She appears well-developed.  HENT:  Head: Normocephalic.  Right Ear: External ear normal.  Left Ear: External ear normal.  Eyes: Pupils are equal, round, and reactive to light.  Neck: No tracheal deviation present. No thyromegaly present.  Cardiovascular: Normal rate.  Pulmonary/Chest: Effort normal.  Abdominal: Soft.  Neurological: [name] alert and oriented to person, place, and time.  Skin: Skin is warm and dry.  Psychiatric: She has a normal mood and affect. Her behavior is normal.    Ortho Exam negative Neer positive Hawkins exquisite tenderness along the long head of the biceps tendon anteriorly no subluxation of the shoulder good cervical range of motion.  Upper extremity reflexes are 2+.  Well-healed cubital tunnel decompression incision.  Interossei are strong.  Distal biceps  tendon is normal.  Specialty Comments:  No specialty comments available.  Imaging: No results found.   PMFS History: Patient Active Problem List   Diagnosis Date Noted  . Bunion of great toe of right foot 09/02/2017  . Ganglion cyst of finger of right hand 02/04/2017  . Acute pain of left knee 01/14/2017  . Right elbow pain 01/14/2017  . Pes anserinus bursitis 02/15/2014  . PRURITUS 10/15/2009  . FATIGUE 07/23/2009  . SHOULDER PAIN, RIGHT 06/21/2009  . NECK PAIN 05/06/2009  . BACK PAIN 02/21/2009  . HYPERTENSION 10/02/2008  . KNEE PAIN, RIGHT 09/27/2008  . FOLLICULITIS  07/29/2008  . UNSPECIFIED VAGINITIS AND VULVOVAGINITIS 01/04/2008  . 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 OBESITY, UNSPECIFIED 11/11/2007  . ALLERGIC RHINITIS, SEASONAL 11/11/2007  . A C L SPRAIN-CHRONIC 06/23/2007  . TEAR A C L 06/23/2007   Past Medical History:  Diagnosis Date  . Carpal tunnel syndrome     No family history on file.  Past Surgical History:  Procedure Laterality Date  . ANTERIOR CRUCIATE LIGAMENT REPAIR Left   . CARPAL TUNNEL RELEASE Right 04/01/2015   Procedure: Right Carpal Tunnel Release;  Surgeon: Eldred Manges, MD;  Location: [facility];  Service: Orthopedics;  Laterality: Right;  . CHOLECYSTECTOMY    . EXCISION METACARPAL MASS Right 04/01/2015   Procedure: Excision Right Long Finger Mass;  Surgeon: Eldred Manges, MD;  Location:  SUR[facility]ERY C[facility];  Service: Orthopedics;  Laterality: Right;   Social History   Occupational History  . Not on file  Tobacco Use  . Smoking status: Never Smoker  . Smokeless tobacco: Never Used  Substance and Sexual Activity  . Alcohol use: Yes    Comment: social  . Drug use: No  . Sexual activity: Not on file

## 2018-02-21 ENCOUNTER — Encounter (INDEPENDENT_AMBULATORY_CARE_PROVIDER_SITE_OTHER): Payer: Self-pay | Admitting: Orthopaedic Surgery

## 2018-02-21 MED ORDER — BUPIVACAINE HCL 0.25 % IJ SOLN
2.0000 mL | INTRAMUSCULAR | Status: AC | PRN
  Administered 2018-02-10: 2 mL via INTRA_ARTICULAR

## 2018-02-21 MED ORDER — LIDOCAINE HCL 1 % IJ SOLN
0.5000 mL | INTRAMUSCULAR | Status: AC | PRN
  Administered 2018-02-10: .5 mL

## 2018-02-21 MED ORDER — METHYLPREDNISOLONE ACETATE 40 MG/ML IJ SUSP
40.0000 mg | INTRAMUSCULAR | Status: AC | PRN
  Administered 2018-02-10: 40 mg via INTRA_ARTICULAR

## 2018-02-22 ENCOUNTER — Encounter: Payer: Self-pay | Admitting: *Deleted

## 2018-02-23 ENCOUNTER — Encounter: Payer: Self-pay | Admitting: Cardiovascular Disease

## 2018-02-23 ENCOUNTER — Encounter

## 2018-02-23 ENCOUNTER — Ambulatory Visit: Payer: BLUE CROSS/BLUE SHIELD | Admitting: Cardiovascular Disease

## 2018-08-01 ENCOUNTER — Other Ambulatory Visit (INDEPENDENT_AMBULATORY_CARE_PROVIDER_SITE_OTHER): Payer: Self-pay | Admitting: Orthopaedic Surgery

## 2018-08-01 NOTE — Telephone Encounter (Signed)
Ok for refill? 

## 2018-08-25 ENCOUNTER — Ambulatory Visit (INDEPENDENT_AMBULATORY_CARE_PROVIDER_SITE_OTHER): Payer: BLUE CROSS/BLUE SHIELD | Admitting: Orthopaedic Surgery

## 2018-08-25 ENCOUNTER — Other Ambulatory Visit: Payer: Self-pay

## 2018-08-25 ENCOUNTER — Encounter (INDEPENDENT_AMBULATORY_CARE_PROVIDER_SITE_OTHER): Payer: Self-pay | Admitting: Orthopaedic Surgery

## 2018-08-25 VITALS — Ht 63.0 in | Wt 205.0 lb

## 2018-08-25 DIAGNOSIS — S86012A Strain of left Achilles tendon, initial encounter: Secondary | ICD-10-CM | POA: Insufficient documentation

## 2018-08-25 NOTE — Progress Notes (Signed)
Office Visit Note   Patient: Leah Phillips           Date of Birth: [birth year]           MRN: [id number] Visit Date: 08/25/2018              Requested by: Kirstie Peri, MD [address] [place], [place] [address] PCP: Kirstie Peri, MD   Assessment & Plan: Visit Diagnoses:  1. Partial tear of left Achilles tendon, initial encounter     Plan: We discussed Achilles tendinopathy.  She has a cam boot at home from her bunion surgery several years ago and can apply this to unload the Achilles tendon.  We discussed tendinopathy and likely partial tear that occurred at the time of her fall.  She will wear the boot for 6 to 8 weeks if she is having persistent problems she will call and we can obtain an MRI scan.  We discussed working on some weight loss to unload her ankle as well.  She will call if she is having persistent problems.  Follow-Up Instructions: Return if symptoms worsen or fail to improve.   Orders:  No orders of the defined types were placed in this encounter.  No orders of the defined types were placed in this encounter.     Procedures: No procedures performed   Clinical Data: No additional findings.   Subjective: Chief Complaint  Patient presents with  . Left Ankle - Pain  . Right Leg - Pain    HPI 48 year old long-term patient with a new injury after she fell down to cement steps 3 to 4 weeks ago.  She had abrasions over both shins is having some persistent tenderness over the right shin but her principal problem is been left posterior ankle pain with Achilles tendon thickening tenderness and pain with pushoff.  No past history of the Achilles problems in the past.  Review of Systems positive her previous bilateral bunion procedures ACL tear with repair hypertension obesity otherwise negative is obtains HPI.   Objective: Vital Signs: Ht 5\' 3"  (1.6 m)   Wt 205 lb (93 kg)   BMI 36.31 kg/m   Physical Exam Constitutional:      Appearance: [name] well-developed.   HENT:     Head: Normocephalic.     Right Ear: External ear normal.     Left Ear: External ear normal.  Eyes:     Pupils: Pupils are equal, round, and reactive to light.  Neck:     Thyroid: No thyromegaly.     Trachea: No tracheal deviation.  Cardiovascular:     Rate and Rhythm: Normal rate.  Pulmonary:     Effort: Pulmonary effort is normal.  Abdominal:     Palpations: Abdomen is soft.  Skin:    General: Skin is warm and dry.  Neurological:     Mental Status: [name] alert and oriented to person, place, and time.  Psychiatric:        Behavior: Behavior normal.     Ortho Exam patient has well-healed knee incisions from ACL repair.  Full extension good flexion of her knee.  There is healed abrasion over the distal third of the shin on the right.  Small subcutaneous 3 mm likely fibroma present above the periosteum.  Good ankle range of motion no ankle instability peroneals are normal anterior tib gastrocsoleus is normal.  Left Achilles tendon shows thickening 2 to 3 cm from insertion site with significant tenderness about 50% increase  in size of the tendon consistent with tendinopathy and partial tear.  Specialty Comments:  No specialty comments available.  Imaging: No results found.   PMFS History: Patient Active Problem List   Diagnosis Date Noted  . Bunion of great toe of right foot 09/02/2017  . Ganglion cyst of finger of right hand 02/04/2017  . Acute pain of left knee 01/14/2017  . Right elbow pain 01/14/2017  . Pes anserinus bursitis 02/15/2014  . PRURITUS 10/15/2009  . FATIGUE 07/23/2009  . SHOULDER PAIN, RIGHT 06/21/2009  . NECK PAIN 05/06/2009  . BACK PAIN 02/21/2009  . HYPERTENSION 10/02/2008  . KNEE PAIN, RIGHT 09/27/2008  . FOLLICULITIS 07/29/2008  . UNSPECIFIED VAGINITIS AND VULVOVAGINITIS 01/04/2008  . 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 OBESITY, UNSPECIFIED 11/11/2007  . ALLERGIC RHINITIS, SEASONAL 11/11/2007  . A C L SPRAIN-CHRONIC  06/23/2007  . TEAR A C L 06/23/2007   Past Medical History:  Diagnosis Date  . Carpal tunnel syndrome     No family history on file.  Past Surgical History:  Procedure Laterality Date  . ANTERIOR CRUCIATE LIGAMENT REPAIR Left   . CARPAL TUNNEL RELEASE Right 04/01/2015   Procedure: Right Carpal Tunnel Release;  Surgeon: Eldred Manges, MD;  Location: [facility];  Service: Orthopedics;  Laterality: Right;  . CHOLECYSTECTOMY    . EXCISION METACARPAL MASS Right 04/01/2015   Procedure: Excision Right Long Finger Mass;  Surgeon: Eldred Manges, MD;  Location: [facility];  Service: Orthopedics;  Laterality: Right;   Social History   Occupational History  . Not on file  Tobacco Use  . Smoking status: Never Smoker  . Smokeless tobacco: Never Used  Substance and Sexual Activity  . Alcohol use: Yes    Comment: social  . Drug use: No  . Sexual activity: Not on file

## 2018-10-06 ENCOUNTER — Encounter (INDEPENDENT_AMBULATORY_CARE_PROVIDER_SITE_OTHER): Payer: Self-pay | Admitting: Orthopaedic Surgery

## 2018-10-06 ENCOUNTER — Ambulatory Visit (INDEPENDENT_AMBULATORY_CARE_PROVIDER_SITE_OTHER): Payer: PRIVATE HEALTH INSURANCE | Admitting: Orthopaedic Surgery

## 2018-10-06 ENCOUNTER — Other Ambulatory Visit: Payer: Self-pay

## 2018-10-06 VITALS — Ht 63.0 in | Wt 205.0 lb

## 2018-10-06 DIAGNOSIS — M542 Cervicalgia: Secondary | ICD-10-CM | POA: Diagnosis not present

## 2018-10-06 DIAGNOSIS — S86012D Strain of left Achilles tendon, subsequent encounter: Secondary | ICD-10-CM | POA: Diagnosis not present

## 2018-10-06 DIAGNOSIS — M25511 Pain in right shoulder: Secondary | ICD-10-CM

## 2018-10-06 MED ORDER — LIDOCAINE HCL 1 % IJ SOLN
0.5000 mL | INTRAMUSCULAR | Status: AC | PRN
  Administered 2018-10-06: 11:00:00 .5 mL

## 2018-10-06 MED ORDER — BUPIVACAINE HCL 0.5 % IJ SOLN
2.0000 mL | INTRAMUSCULAR | Status: AC | PRN
  Administered 2018-10-06: 2 mL via INTRA_ARTICULAR

## 2018-10-06 MED ORDER — METHYLPREDNISOLONE ACETATE 40 MG/ML IJ SUSP
40.0000 mg | INTRAMUSCULAR | Status: AC | PRN
  Administered 2018-10-06: 11:00:00 40 mg via INTRA_ARTICULAR

## 2018-10-06 NOTE — Progress Notes (Signed)
Office Visit Note   Patient: Leah Phillips           Date of Birth: Feb 25, 1971           MRN: [id number] Visit Date: 10/06/2018              Requested by: Kirstie PeriShah, Ashish, MD [address] [place], [place] [id number] PCP: Kirstie PeriShah, Ashish, MD   Assessment & Plan: Visit Diagnoses:  1. Partial tear of left Achilles tendon, subsequent encounter   2. Pain in joint of right shoulder   3. NECK PAIN     Plan: Right shoulder injection performed intra-articular around biceps tendon in the groove she got good relief with injection.  She requested proceeding with an MRI of her left Achilles tendon where she has partial tearing and thickening of the tendon.  Office follow-up after MRI scan.  We discussed weight loss help unload her tendon as well.  Follow-Up Instructions: No follow-ups on file.   Orders:  Orders Placed This Encounter  Procedures  . Large Joint Inj: R glenohumeral  . MR Ankle Left w/o contrast   No orders of the defined types were placed in this encounter.     Procedures: Large Joint Inj: R glenohumeral on 10/06/2018 10:46 AM Indications: pain Details: 25 G 1.5 in needle  Arthrogram: No  Medications: 40 mg methylPREDNISolone acetate 40 MG/ML; 0.5 mL lidocaine 1 %; 2 mL bupivacaine 0.5 % Outcome: tolerated well, no immediate complications Procedure, treatment alternatives, risks and benefits explained, specific risks discussed. Consent was given by the patient. Immediately prior to procedure a time out was called to verify the correct patient, procedure, equipment, support staff and site/side marked as required. Patient was prepped and draped in the usual sterile fashion.       Clinical Data: No additional findings.   Subjective: Chief Complaint  Patient presents with  . Right Shoulder - Pain    HPI 48 year old female returns with recurrent right shoulder biceps tendinopathy symptoms.  She has had popping pain with squeezing.  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  [name] used ibuprofen ice and heat without relief.  Symptoms been significantly more painful in the last 2 weeks she is requesting repeat injection.  Patient's other problem is left Achilles tendon tendinopathy with palpable thickening.  [name] used the boot and made her knee start hurting more she had to stop using the boot now she is having recurrent problems at the Achilles tendon with pain and a limp and she is requesting an MRI scan.  [name] used the boot for several months.  Review of Systems 14 point review of systems updated.  Previous bunion surgery ACL reconstruction.  Positive hypertension obesity impingement of the shoulder with right shoulder biceps tendinopathy.   Objective: Vital Signs: Ht 5\' 3"  (1.6 m)   Wt 205 lb (93 kg)   BMI 36.31 kg/m   Physical Exam Constitutional:      Appearance: [name] well-developed.  HENT:     Head: Normocephalic.     Right Ear: External ear normal.     Left Ear: External ear normal.  Eyes:     Pupils: Pupils are equal, round, and reactive to light.  Neck:     Thyroid: No thyromegaly.     Trachea: No tracheal deviation.  Cardiovascular:     Rate and Rhythm: Normal rate.  Pulmonary:  Effort: Pulmonary effort is normal.  Abdominal:     Palpations: Abdomen is soft.  Skin:    General: Skin is warm and dry.  Neurological:     Mental Status: [name] alert and oriented to person, place, and time.  Psychiatric:        Behavior: Behavior normal.     Ortho Exam presents exquisite tenderness of the long head biceps tendon no distal biceps migration elbow reaches full extension.  Pain with resisted biceps resistance directly over the long of the biceps negative drop arm test.  Negative Yergason.  Hawkins test positive Neer test negative.  She still has some brachial plexus tenderness both right and left worse on the right. Left Achilles tendon about 2 cm from the  insertion site shows thickening with tenderness.  There extends for about 2 cm and has a least 50% enlargement in the with of the Achilles tendon where she is tender.  Pain with resisted Achilles testing with toe raising.  Specialty Comments:  No specialty comments available.  Imaging: No results found.   PMFS History: Patient Active Problem List   Diagnosis Date Noted  . Partial tear of left Achilles tendon 08/25/2018  . Bunion of great toe of right foot 09/02/2017  . Ganglion cyst of finger of right hand 02/04/2017  . Acute pain of left knee 01/14/2017  . Right elbow pain 01/14/2017  . Pes anserinus bursitis 02/15/2014  . PRURITUS 10/15/2009  . FATIGUE 07/23/2009  . SHOULDER PAIN, RIGHT 06/21/2009  . NECK PAIN 05/06/2009  . BACK PAIN 02/21/2009  . HYPERTENSION 10/02/2008  . KNEE PAIN, RIGHT 09/27/2008  . FOLLICULITIS 07/29/2008  . UNSPECIFIED VAGINITIS AND VULVOVAGINITIS 01/04/2008  . 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 OBESITY, UNSPECIFIED 11/11/2007  . ALLERGIC RHINITIS, SEASONAL 11/11/2007  . A C L SPRAIN-CHRONIC 06/23/2007  . TEAR A C L 06/23/2007   Past Medical History:  Diagnosis Date  . Carpal tunnel syndrome     No family history on file.  Past Surgical History:  Procedure Laterality Date  . ANTERIOR CRUCIATE LIGAMENT REPAIR Left   . CARPAL TUNNEL RELEASE Right 04/01/2015   Procedure: Right Carpal Tunnel Release;  Surgeon: Eldred Manges, MD;  Location: [facility];  Service: Orthopedics;  Laterality: Right;  . CHOLECYSTECTOMY    . EXCISION METACARPAL MASS Right 04/01/2015   Procedure: Excision Right Long Finger Mass;  Surgeon: Eldred Manges, MD;  Location: [facility];  Service: Orthopedics;  Laterality: Right;   Social History   Occupational History  . Not on file  Tobacco Use  . Smoking status: Never Smoker  . Smokeless tobacco: Never Used  Substance and Sexual Activity  . Alcohol use: Yes    Comment: social  .  Drug use: No  . Sexual activity: Not on file

## 2018-10-13 ENCOUNTER — Telehealth: Payer: Self-pay | Admitting: Radiology

## 2018-10-13 DIAGNOSIS — S86012D Strain of left Achilles tendon, subsequent encounter: Secondary | ICD-10-CM

## 2018-10-13 NOTE — Telephone Encounter (Signed)
Received fax from [facility] stating patient's MRI Left Ankle was denied due to no physical therapy in the last 6 months. Per Dr. Ophelia Charter, send for PT and have her follow up in six weeks for recheck.    Therapy script given to [facility] [place] staff for scheduling. Patient advised.

## 2018-10-28 ENCOUNTER — Encounter (HOSPITAL_COMMUNITY): Payer: Self-pay

## 2018-10-28 ENCOUNTER — Ambulatory Visit (HOSPITAL_COMMUNITY): Payer: PRIVATE HEALTH INSURANCE | Attending: Orthopaedic Surgery

## 2018-10-28 ENCOUNTER — Other Ambulatory Visit: Payer: Self-pay

## 2018-10-28 DIAGNOSIS — R29898 Other symptoms and signs involving the musculoskeletal system: Secondary | ICD-10-CM | POA: Diagnosis present

## 2018-10-28 DIAGNOSIS — M25672 Stiffness of left ankle, not elsewhere classified: Secondary | ICD-10-CM | POA: Diagnosis present

## 2018-10-28 DIAGNOSIS — M25572 Pain in left ankle and joints of left foot: Secondary | ICD-10-CM | POA: Diagnosis present

## 2018-10-28 NOTE — Therapy (Addendum)
[name] [address] [place], [place], [address] Phone: [phone]   Fax:  [phone]  Physical Therapy Evaluation  Patient Details  Name: Leah Phillips MRN: [id number] Date of Birth: [birth year] Referring Provider (PT): Eldred Manges, MD   Encounter Date: 10/28/2018  PT End of Session - 10/28/18 1218    Visit Number  1    Number of Visits  12    Date for PT Re-Evaluation  12/09/18    Authorization Type  [employer] (30 visits PT/OT combined - 0 used at eval, no auth required)    Authorization Time Period  10/28/18-12/09/18    Authorization - Visit Number  1    Authorization - Number of Visits  30    PT Start Time  0830   pt late   PT Stop Time  0904    PT Time Calculation (min)  34 min    Activity Tolerance  Patient tolerated treatment well    Behavior During Therapy  [facility] for tasks assessed/performed       Past Medical History:  Diagnosis Date  . Carpal tunnel syndrome     Past Surgical History:  Procedure Laterality Date  . ANTERIOR CRUCIATE LIGAMENT REPAIR Left   . CARPAL TUNNEL RELEASE Right 04/01/2015   Procedure: Right Carpal Tunnel Release;  Surgeon: Eldred Manges, MD;  Location: [facility];  Service: Orthopedics;  Laterality: Right;  . CHOLECYSTECTOMY    . EXCISION METACARPAL MASS Right 04/01/2015   Procedure: Excision Right Long Finger Mass;  Surgeon: Eldred Manges, MD;  Location: [facility];  Service: Orthopedics;  Laterality: Right;    There were no vitals filed for this visit.   Subjective Assessment - 10/28/18 0838    Subjective  Patient reports she has had Lt ankle pain since ~ January/February. She reports her ankle hurts most in the morning when first getting up and in the evening after being on her feet all day. She works as a Lawyer in Armed forces operational officer. She reports typically she wears flip flops or her crocs but at work and when doing yard work tries to wear her Community education officer. She states her sneakers hurt her ankle the most of any shoe she has but she still wants to wear them for work; they are only 26 months old. Patient reports Dr. Ophelia Charter instructed her to wear a CAM boot for 6 weeks but she was only able to tolerate 1 week of it before her ankle began hurting more and she feels it aggravated her knee as well. She states at worse her pain can reach a 9/10 and at best it can go all the way to 0/10. She feels over the last 24 hours her highest pain has been 5/10 and currently denies pain. She report occasionally she will develop tingling sensation in her ankle and foot as well.     Pertinent History  bil bunionectomy    Limitations  Walking    How long can you stand comfortably?  about 1 hour - makes knee hurt    Currently in Pain?  No/denies         [facility] PT Assessment - 10/28/18 0001      Assessment   Medical Diagnosis  Lt Achilles Tendon Pain    Referring Provider (PT)  Eldred Manges, MD    Onset Date/Surgical Date  --   this past Jan-Feb started noticing it   Hand  Dominance  Right    Next MD Visit  not until after therapy    Prior Therapy  Yes before for Lt knee      Precautions   Precautions  None      Restrictions   Weight Bearing Restrictions  No      Balance Screen   Has the patient fallen in the past 6 months  No    Has the patient had a decrease in activity level because of a fear of falling?   No    Is the patient reluctant to leave their home because of a fear of falling?   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 [name] currently building a wall in her yard to house her solar lights and is creating a loft for a twin bed.       Cognition   Overall Cognitive Status  Within Functional Limits for tasks assessed      Observation/Other Assessments   Focus on Therapeutic Outcomes (FOTO)   --   take next visit     ROM / Strength   AROM / PROM / Strength  AROM;Strength      AROM   AROM Assessment Site  Ankle    Right/Left Ankle  Right;Left    Right Ankle Dorsiflexion  12    Right Ankle Plantar Flexion  64    Right Ankle Inversion  42    Right Ankle Eversion  32    Left Ankle Dorsiflexion  5    Left Ankle Plantar Flexion  54    Left Ankle Inversion  46    Left Ankle Eversion  36      Strength   Strength Assessment Site  Ankle;Hip    Right Hip Extension  4+/5    Right Hip ABduction  4+/5    Left Hip Extension  4+/5    Left Hip ABduction  4/5    Right/Left Ankle  Right;Left    Right Ankle Dorsiflexion  5/5    Right Ankle Plantar Flexion  5/5    Right Ankle Inversion  5/5    Right Ankle Eversion  5/5    Left Ankle Dorsiflexion  5/5    Left Ankle Plantar Flexion  4+/5   pain   Left Ankle Inversion  4+/5    Left Ankle Eversion  4+/5   pain on medial ankle     Palpation   Palpation comment  tenderness to palpation of achilles tendon on Lt LE ~ 2 inches above calcaneous insertion. Tendon is thickened compared to Rt anlke.  Special Tests    Special Tests  Ankle/Foot Special Tests    Ankle/Foot Special Tests   Thompson's Test;Tinel's Test - post tibialis;Provocative Tinel's Test      Thompson's Test   Findings  Negative    Side  Right;Left      Tinel's test - Post Tibialis    Findings  Negative    Side  Right;Left      Provocative Tinel's test     Findings  Negative    Side  Right;Left       Objective measurements completed on examination: See above findings.    [facility] Adult PT Treatment/Exercise - 10/28/18 0001      Exercises   Exercises  Ankle      Ankle Exercises: Standing   Other Standing Ankle Exercises  Heel Raise: bil raise, with Lt LE eccentric lowering: 1x 20 reps        PT Education - 10/28/18 1226    Education Details  Educated on exam findings and on appropriate POC. Educated on initial HEP.    Person(s) Educated  Patient    Methods  Explanation;Handout;Demonstration    Comprehension  Verbalized understanding;Returned demonstration       PT Short Term Goals - 10/28/18 1219      PT SHORT TERM GOAL #1   Title  Patient will be independent with HEP, updated PRN, to improve fucntional strength of LE and tolerance to loading Lt achilles tendon.    Time  2    Period  Weeks    Status  New    Target Date  11/11/18      PT SHORT TERM GOAL #2   Title  Patient will have no pain with resistance testing via MMT for Rt ankle to indicate improved tolerance to muscle activation and load.     Time  3    Period  Weeks    Status  New    Target Date  11/18/18        PT Long Term Goals - 10/28/18 1221      PT LONG TERM GOAL #1   Title  Patient will make 10% improvement or greater in FOTO inidicating significant reduction in self reported limitations related to Lt ankle pain.     Time  6    Period  Weeks    Status  New    Target Date  12/09/18      PT LONG TERM GOAL #2   Title  Patient will improve Lt ankle AROM to be painfree and equal Rt ankle and fall within functional limits.    Time  6    Period  Weeks    Status  New      PT LONG TERM GOAL #3   Title  Patient will report being able to walk for 2 hours or more without requiring a break due to Lt ankle pain indicating improved activity tolerance and improving QOL.    Time  6    Period  Weeks    Status  New        Plan - 10/28/18 1224    Clinical  Impression Statement  Ms. Rasul presents to physical therapy for evaluation of Lt ankle pain. She has been experiencing pain in her posterior/medial ankle for ~ 5 months that is worsened walking and standing for prolonged periods. She presents with weakness of her Lt ankle plantar flexors, inverters, and everters. She has a negative Rickel test but is tender to palpation along her  achilles tendon with greatest irritability ~ 2 inches above calcaneal insertion. Tendon is thickened on Lt ankle compared to Rt. Patient denies any known injury or trauma in past to her Lt ankle prior to this onset. [name] presenting with signs and symptoms indicative of achilles tendinopathy and will benefit from skilled PT interventions to address impairments and improve function.    Personal Factors and Comorbidities  Comorbidity 1    Comorbidities  HTN    Examination-Activity Limitations  Stand;Locomotion Level    Stability/Clinical Decision Making  Stable/Uncomplicated    Clinical Decision Making  Low    Rehab Potential  Good    PT Frequency  2x / week    PT Duration  6 weeks    PT Treatment/Interventions  ADLs/Self Care Home Management;Aquatic Therapy;Cryotherapy;Electrical Stimulation;Iontophoresis /ml Dexamethasone;Moist Heat;Functional mobility training;Therapeutic activities;Therapeutic exercise;Balance training;Neuromuscular re-education;Patient/family education;Manual techniques;Passive range of motion;Taping;Joint Manipulations; Dry Needling    PT Next Visit Plan  Review eval and goals. Initiate cross fircition massage to achilles as tolerated. Cotninued with high repetition of eccentric lowering on Lt LE and progress to step when able. Add 4 way anlke strengthening.    PT Home Exercise Plan  Eval: eccentric lowering form heel raise    Consulted and Agree with Plan of Care  Patient       Patient will benefit from skilled therapeutic intervention in order to improve the following deficits and impairments:   Abnormal gait, Decreased activity tolerance, Decreased range of motion, Decreased strength, Difficulty walking, Hypomobility, Pain, Increased fascial restricitons, Impaired flexibility  Visit Diagnosis: Pain in left ankle and joints of left foot  Stiffness of left ankle, not elsewhere classified  Other symptoms and signs involving the musculoskeletal system     Problem List Patient Active Problem List   Diagnosis Date Noted  . Partial tear of left Achilles tendon 08/25/2018  . Bunion of great toe of right foot 09/02/2017  . Ganglion cyst of finger of right hand 02/04/2017  . Acute pain of left knee 01/14/2017  . Right elbow pain 01/14/2017  . Pes anserinus bursitis 02/15/2014  . PRURITUS 10/15/2009  . FATIGUE 07/23/2009  . SHOULDER PAIN, RIGHT 06/21/2009  . NECK PAIN 05/06/2009  . BACK PAIN 02/21/2009  . HYPERTENSION 10/02/2008  . KNEE PAIN, RIGHT 09/27/2008  . FOLLICULITIS 07/29/2008  . UNSPECIFIED VAGINITIS AND VULVOVAGINITIS 01/04/2008  . 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 OBESITY, UNSPECIFIED 11/11/2007  . ALLERGIC RHINITIS, SEASONAL 11/11/2007  . A C L SPRAIN-CHRONIC 06/23/2007  . TEAR A C L 06/23/2007    Valentino Saxon, PT, DPT, [facility] Physical Therapist with [facility]  10/28/2018 12:28 PM    [facility] [address] [place], [place], [address] Phone: [phone]   Fax:  [phone]  Name: Leah Phillips MRN: [id number] Date of Birth: 1970-11-16

## 2018-10-28 NOTE — Patient Instructions (Signed)
Eccentric Heel Lowering on Step reps: 20 sets: 3 daily: 1-2 weekly: 7   Exercise image step 1   Exercise image step 2  Setup  Begin standing on a small step or platform with your heels off the edge, holding onto a stable object for balance. Movement  Raise both heels up, then lift one foot off the platform and slowly lower your other heel. Repeat this movement. Tip  Make sure to maintain your balance and keep your back straight throughout the exercise.

## 2018-11-02 ENCOUNTER — Ambulatory Visit (HOSPITAL_COMMUNITY): Payer: PRIVATE HEALTH INSURANCE | Admitting: Physical Therapy

## 2018-11-03 ENCOUNTER — Ambulatory Visit (HOSPITAL_COMMUNITY): Payer: PRIVATE HEALTH INSURANCE

## 2018-11-03 ENCOUNTER — Encounter (HOSPITAL_COMMUNITY): Payer: Self-pay

## 2018-11-03 ENCOUNTER — Other Ambulatory Visit: Payer: Self-pay

## 2018-11-03 DIAGNOSIS — R29898 Other symptoms and signs involving the musculoskeletal system: Secondary | ICD-10-CM

## 2018-11-03 DIAGNOSIS — M25572 Pain in left ankle and joints of left foot: Secondary | ICD-10-CM

## 2018-11-03 DIAGNOSIS — M25672 Stiffness of left ankle, not elsewhere classified: Secondary | ICD-10-CM

## 2018-11-03 NOTE — Therapy (Signed)
[facility] [address] [place], [place], [address] Phone: [phone]   Fax:  [phone]  Physical Therapy Treatment  Patient Details  Name: Leah Phillips MRN: [id number] Date of Birth: 1971/01/06 Referring Provider (PT): Eldred MangesYates, Mark C, MD   Encounter Date: 11/03/2018  PT End of Session - 11/03/18 0826    Visit Number  2    Number of Visits  12    Date for PT Re-Evaluation  12/09/18    Authorization Type  [employer] (30 visits PT/OT combined - 0 used at eval, no auth required)    Authorization Time Period  10/28/18-12/09/18    Authorization - Visit Number  2    Authorization - Number of Visits  30    PT Start Time  0826    PT Stop Time  0908    PT Time Calculation (min)  42 min    Activity Tolerance  Patient tolerated treatment well    Behavior During Therapy  [facility] for tasks assessed/performed       Past Medical History:  Diagnosis Date  . Carpal tunnel syndrome     Past Surgical History:  Procedure Laterality Date  . ANTERIOR CRUCIATE LIGAMENT REPAIR Left   . CARPAL TUNNEL RELEASE Right 04/01/2015   Procedure: Right Carpal Tunnel Release;  Surgeon: Eldred MangesMark C Yates, MD;  Location: [facility];  Service: Orthopedics;  Laterality: Right;  . CHOLECYSTECTOMY    . EXCISION METACARPAL MASS Right 04/01/2015   Procedure: Excision Right Long Finger Mass;  Surgeon: Eldred MangesMark C Yates, MD;  Location: [facility];  Service: Orthopedics;  Laterality: Right;    There were no vitals filed for this visit.  Subjective Assessment - 11/03/18 0826    Subjective  Pt states that her ankle has still been sore off and on. No pain at the moment. Did her HEP; went well besides making it sore and making her knee hurt.     Pertinent History  bil bunionectomy    Limitations  Walking    How long can you stand comfortably?  about 1 hour - makes knee hurt    Currently in Pain?  No/denies         [facility] PT Assessment - 11/03/18  0001      Observation/Other Assessments   Focus on Therapeutic Outcomes (FOTO)   40% limitation           [facility] Adult PT Treatment/Exercise - 11/03/18 0001      Manual Therapy   Manual Therapy  Soft tissue mobilization    Manual therapy comments  separate rest of treatment    Soft tissue mobilization  STM to gastroc soleus mm belly and cross friction to Achilles tendon to reduce restrictions an dpain      Ankle Exercises: Stretches   Slant Board Stretch  3 reps;30 seconds    Other Stretch  knee drives 12" step 10x10" for ankle DF      Ankle Exercises: Standing   Rocker Board  2 minutes   R/L, A/P x2 mins each   Other Standing Ankle Exercises  Heel Raise variations: bil raise, with Lt LE eccentric lowering: 1x 20 reps; isometric holds 10x10"      Ankle Exercises: Seated   Other Seated Ankle Exercises  3-way ankle GTB x15 reps (DF, in, ev)             PT Education - 11/03/18 0826    Education Details  reviewed  goals, exercise technique, continue HEP    Person(s) Educated  Patient    Methods  Explanation;Demonstration    Comprehension  Verbalized understanding;Returned demonstration       PT Short Term Goals - 10/28/18 1219      PT SHORT TERM GOAL #1   Title  Patient will be independent with HEP, updated PRN, to improve fucntional strength of LE and tolerance to loading Lt achilles tendon.    Time  2    Period  Weeks    Status  New    Target Date  11/11/18      PT SHORT TERM GOAL #2   Title  Patient will have no pain with resistance testing via MMT for Rt ankle to indicate improved tolerance to muscle activation and load.     Time  3    Period  Weeks    Status  New    Target Date  11/18/18        PT Long Term Goals - 10/28/18 1221      PT LONG TERM GOAL #1   Title  Patient will make 10% improvement or greater in FOTO inidicating significant reduction in self reported limitations related to Lt ankle pain.     Time  6    Period  Weeks    Status  New     Target Date  12/09/18      PT LONG TERM GOAL #2   Title  Patient will improve Lt ankle AROM to be painfree and equal Rt ankle and fall within functional limits.    Time  6    Period  Weeks    Status  New      PT LONG TERM GOAL #3   Title  Patient will report being able to walk for 2 hours or more without requiring a break due to Lt ankle pain indicating improved activity tolerance and improving QOL.    Time  6    Period  Weeks    Status  New            Plan - 11/03/18 0910    Clinical Impression Statement  Began session by reviewing goals and administering FOTO; pt scored 40% limitation on FOTO and had no f/u questions to goals. Rest of session focused on L ankle ROM, strength, and reducing soft tissue restrictions. Pt slightly limited throughout therex due to knee pain so modified as needed. Ended with manual STM to gastroc/soleus mm and cross friction to Achilles tendon. Pt has palpable knots throughout mm belly which she stated were very tender to palpation and radiated down towards the achilles tendon; educated pt on trigger point dry needling and she was agreeable to try when able. This PT will reach out to evaluating therapist to update PT cert to include dry needling in the plan. Pt reported some tenderness following manual. Continue as planned, progressing as able.     Personal Factors and Comorbidities  Comorbidity 1    Comorbidities  HTN    Examination-Activity Limitations  Stand;Locomotion Level    Stability/Clinical Decision Making  Stable/Uncomplicated    Rehab Potential  Good    PT Frequency  2x / week    PT Duration  6 weeks    PT Treatment/Interventions  ADLs/Self Care Home Management;Aquatic Therapy;Cryotherapy;Electrical Stimulation;Iontophoresis /ml Dexamethasone;Moist Heat;Functional mobility training;Therapeutic activities;Therapeutic exercise;Balance training;Neuromuscular re-education;Patient/family education;Manual techniques;Passive range of  motion;Taping;Joint Manipulations    PT Next Visit Plan  contiue cross fircition massage to achilles as tolerated. Continue  with high repetition of eccentric lowering on Lt LE and progress to step when able. continue 3-way anlke strengthening.    PT Home Exercise Plan  Eval: eccentric lowering form heel raise    Consulted and Agree with Plan of Care  Patient       Patient will benefit from skilled therapeutic intervention in order to improve the following deficits and impairments:  Abnormal gait, Decreased activity tolerance, Decreased range of motion, Decreased strength, Difficulty walking, Hypomobility, Pain, Increased fascial restricitons, Impaired flexibility  Visit Diagnosis: Pain in left ankle and joints of left foot  Stiffness of left ankle, not elsewhere classified  Other symptoms and signs involving the musculoskeletal system     Problem List Patient Active Problem List   Diagnosis Date Noted  . Partial tear of left Achilles tendon 08/25/2018  . Bunion of great toe of right foot 09/02/2017  . Ganglion cyst of finger of right hand 02/04/2017  . Acute pain of left knee 01/14/2017  . Right elbow pain 01/14/2017  . Pes anserinus bursitis 02/15/2014  . PRURITUS 10/15/2009  . FATIGUE 07/23/2009  . SHOULDER PAIN, RIGHT 06/21/2009  . NECK PAIN 05/06/2009  . BACK PAIN 02/21/2009  . HYPERTENSION 10/02/2008  . KNEE PAIN, RIGHT 09/27/2008  . FOLLICULITIS 07/29/2008  . UNSPECIFIED VAGINITIS AND VULVOVAGINITIS 01/04/2008  . 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 OBESITY, UNSPECIFIED 11/11/2007  . ALLERGIC RHINITIS, SEASONAL 11/11/2007  . A C L SPRAIN-CHRONIC 06/23/2007  . TEAR A C L 06/23/2007     Jac Canavan PT, DPT   [facility] [address] [place], [place], [address] Phone: [phone]   Fax:  [phone]  Name: Leah Phillips MRN: [id number] Date of Birth: June 19, 1970

## 2018-11-04 ENCOUNTER — Ambulatory Visit (HOSPITAL_COMMUNITY): Payer: PRIVATE HEALTH INSURANCE

## 2018-11-04 ENCOUNTER — Encounter (HOSPITAL_COMMUNITY): Payer: Self-pay

## 2018-11-04 DIAGNOSIS — R29898 Other symptoms and signs involving the musculoskeletal system: Secondary | ICD-10-CM

## 2018-11-04 DIAGNOSIS — M25572 Pain in left ankle and joints of left foot: Secondary | ICD-10-CM

## 2018-11-04 DIAGNOSIS — M25672 Stiffness of left ankle, not elsewhere classified: Secondary | ICD-10-CM

## 2018-11-04 NOTE — Therapy (Signed)
[facility] [address] [place], [place], [address] Phone: [phone]   Fax:  [phone]  Physical Therapy Treatment  Patient Details  Name: Leah Phillips MRN: [id number] Date of Birth: April 29, 1971 Referring Provider (PT): Eldred MangesYates, Mark C, MD   Encounter Date: 11/04/2018  PT End of Session - 11/04/18 1441    Visit Number  3    Number of Visits  12    Date for PT Re-Evaluation  12/09/18    Authorization Type  [employer] [place] (30 visits PT/OT combined - 0 used at eval, no auth required)    Authorization Time Period  10/28/18-12/09/18    Authorization - Visit Number  3    Authorization - Number of Visits  30    PT Start Time  1442    PT Stop Time  1520    PT Time Calculation (min)  38 min    Activity Tolerance  Patient tolerated treatment well    Behavior During Therapy  [facility] for tasks assessed/performed       Past Medical History:  Diagnosis Date  . Carpal tunnel syndrome     Past Surgical History:  Procedure Laterality Date  . ANTERIOR CRUCIATE LIGAMENT REPAIR Left   . CARPAL TUNNEL RELEASE Right 04/01/2015   Procedure: Right Carpal Tunnel Release;  Surgeon: Eldred MangesMark C Yates, MD;  Location: [facility];  Service: Orthopedics;  Laterality: Right;  . CHOLECYSTECTOMY    . EXCISION METACARPAL MASS Right 04/01/2015   Procedure: Excision Right Long Finger Mass;  Surgeon: Eldred MangesMark C Yates, MD;  Location: [facility];  Service: Orthopedics;  Laterality: Right;    There were no vitals filed for this visit.  Subjective Assessment - 11/04/18 1444    Subjective  Pt states that her heel is sore today, rating it 4/10.    Pertinent History  bil bunionectomy    Limitations  Walking    How long can you stand comfortably?  about 1 hour - makes knee hurt    Currently in Pain?  Yes    Pain Score  4     Pain Location  Heel    Pain Orientation  Left    Pain Descriptors / Indicators  Sore    Pain Type  Chronic pain    Pain  Onset  More than a month ago    Pain Frequency  Constant           [facility] Adult PT Treatment/Exercise - 11/04/18 0001      Manual Therapy   Manual Therapy  Soft tissue mobilization    Manual therapy comments  separate rest of treatment    Soft tissue mobilization  STM after needling to gastroc soleus mm belly and cross friction to Achilles tendon to reduce restrictions and pain      Ankle Exercises: Stretches   Slant Board Stretch  3 reps;30 seconds      Ankle Exercises: Standing   Other Standing Ankle Exercises  Heel Raise variations: bil raise, with LLE eccentric lowering: 1x 20 reps; isometric holds 10x10" weight mostly on LLE, on slant board BLE x15      Ankle Exercises: Seated   BAPS  Sitting;Level 3;10 reps   DF/PF, CW/CCW     Trigger Point Dry Needling - 11/04/18 0001    Consent Given?  Yes    Education Handout Provided  Yes    Muscles Treated Lower Quadrant  Gastrocnemius;Soleus    Gastrocnemius Response  Twitch  response elicited;Palpable increased muscle length   L in prone   Soleus Response  Twitch response elicited;Palpable increased muscle length   L in prone            PT Short Term Goals - 10/28/18 1219      PT SHORT TERM GOAL #1   Title  Patient will be independent with HEP, updated PRN, to improve fucntional strength of LE and tolerance to loading Lt achilles tendon.    Time  2    Period  Weeks    Status  New    Target Date  11/11/18      PT SHORT TERM GOAL #2   Title  Patient will have no pain with resistance testing via MMT for Rt ankle to indicate improved tolerance to muscle activation and load.     Time  3    Period  Weeks    Status  New    Target Date  11/18/18        PT Long Term Goals - 10/28/18 1221      PT LONG TERM GOAL #1   Title  Patient will make 10% improvement or greater in FOTO inidicating significant reduction in self reported limitations related to Lt ankle pain.     Time  6    Period  Weeks    Status  New     Target Date  12/09/18      PT LONG TERM GOAL #2   Title  Patient will improve Lt ankle AROM to be painfree and equal Rt ankle and fall within functional limits.    Time  6    Period  Weeks    Status  New      PT LONG TERM GOAL #3   Title  Patient will report being able to walk for 2 hours or more without requiring a break due to Lt ankle pain indicating improved activity tolerance and improving QOL.    Time  6    Period  Weeks    Status  New            Plan - 11/04/18 1534    Clinical Impression Statement  Evaluating therapist added dry needling to pt's cert and pt agreeable to initiate it today. Good twitch responses elicited in gastroc/soleus complex. Followed up with STM to further facilitate reduced restrictions, pain, and post-needling soreness. Ended session with mm activation. Less knee pain this date but pt reporting mm fatigue/weakness during activities. Added seated BAPS for improved ankle mobility while reducing knee pain. Pt reported being a little sore, mostly at distal calf at EOS. Educated pt to drink plenty of water this afternoon to help reduce post-needling soreness. Session slightly limited as pt 12 mins late for appointment.    Personal Factors and Comorbidities  Comorbidity 1    Comorbidities  HTN    Examination-Activity Limitations  Stand;Locomotion Level    Stability/Clinical Decision Making  Stable/Uncomplicated    Rehab Potential  Good    PT Frequency  2x / week    PT Duration  6 weeks    PT Treatment/Interventions  ADLs/Self Care Home Management;Aquatic Therapy;Cryotherapy;Electrical Stimulation;Iontophoresis 4mg /ml Dexamethasone;Moist Heat;Functional mobility training;Therapeutic activities;Therapeutic exercise;Balance training;Neuromuscular re-education;Patient/family education;Manual techniques;Passive range of motion;Taping;Joint Manipulations;Dry needling    PT Next Visit Plan  contiue cross fircition massage to achilles as tolerated. Continue with high  repetition of eccentric lowering on Lt LE and progress to step when able. continue 3-way anlke strengthening, dry needling when able if +response  PT Home Exercise Plan  Eval: eccentric lowering form heel raise; 5/29: isometric calf raises    Consulted and Agree with Plan of Care  Patient       Patient will benefit from skilled therapeutic intervention in order to improve the following deficits and impairments:  Abnormal gait, Decreased activity tolerance, Decreased range of motion, Decreased strength, Difficulty walking, Hypomobility, Pain, Increased fascial restricitons, Impaired flexibility  Visit Diagnosis: Pain in left ankle and joints of left foot  Stiffness of left ankle, not elsewhere classified  Other symptoms and signs involving the musculoskeletal system     Problem List Patient Active Problem List   Diagnosis Date Noted  . Partial tear of left Achilles tendon 08/25/2018  . Bunion of great toe of right foot 09/02/2017  . Ganglion cyst of finger of right hand 02/04/2017  . Acute pain of left knee 01/14/2017  . Right elbow pain 01/14/2017  . Pes anserinus bursitis 02/15/2014  . PRURITUS 10/15/2009  . FATIGUE 07/23/2009  . SHOULDER PAIN, RIGHT 06/21/2009  . NECK PAIN 05/06/2009  . BACK PAIN 02/21/2009  . HYPERTENSION 10/02/2008  . KNEE PAIN, RIGHT 09/27/2008  . FOLLICULITIS 07/29/2008  . UNSPECIFIED VAGINITIS AND VULVOVAGINITIS 01/04/2008  . 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 OBESITY, UNSPECIFIED 11/11/2007  . ALLERGIC RHINITIS, SEASONAL 11/11/2007  . A C L SPRAIN-CHRONIC 06/23/2007  . TEAR A C L 06/23/2007        Jac Canavan PT, DPT   [facility] [address] [place], [place], [address] Phone: [phone]   Fax:  [phone]  Name: Leah Phillips MRN: [id number] Date of Birth: Oct 03, 1970

## 2018-11-04 NOTE — Addendum Note (Signed)
Addended by: Anitra Lauth on: 11/04/2018 07:46 AM   Modules accepted: Orders

## 2018-11-04 NOTE — Patient Instructions (Signed)

## 2018-11-07 ENCOUNTER — Ambulatory Visit (HOSPITAL_COMMUNITY): Payer: PRIVATE HEALTH INSURANCE | Attending: Orthopaedic Surgery | Admitting: Physical Therapy

## 2018-11-07 ENCOUNTER — Telehealth (HOSPITAL_COMMUNITY): Payer: Self-pay | Admitting: Physical Therapy

## 2018-11-07 DIAGNOSIS — M25672 Stiffness of left ankle, not elsewhere classified: Secondary | ICD-10-CM | POA: Insufficient documentation

## 2018-11-07 DIAGNOSIS — M25572 Pain in left ankle and joints of left foot: Secondary | ICD-10-CM | POA: Insufficient documentation

## 2018-11-07 DIAGNOSIS — R29898 Other symptoms and signs involving the musculoskeletal system: Secondary | ICD-10-CM | POA: Insufficient documentation

## 2018-11-07 NOTE — Telephone Encounter (Signed)
Called regarding patient not showing for 3:30 appointment. Left a message stating hoped that patient was doing well and reminding her of next appointment and phone number for clinic if needed.  Verne Carrow PT, DPT 4:18 PM, 11/07/18 [phone]

## 2018-11-10 ENCOUNTER — Telehealth (HOSPITAL_COMMUNITY): Payer: Self-pay | Admitting: Internal Medicine

## 2018-11-10 NOTE — Telephone Encounter (Signed)
11/10/18  I called to see if patient could come in at 3:30 instead of 3:15 and she said she could.

## 2018-11-11 ENCOUNTER — Telehealth (HOSPITAL_COMMUNITY): Payer: Self-pay | Admitting: Physical Therapy

## 2018-11-11 ENCOUNTER — Ambulatory Visit (HOSPITAL_COMMUNITY): Payer: PRIVATE HEALTH INSURANCE | Admitting: Physical Therapy

## 2018-11-11 NOTE — Telephone Encounter (Signed)
Pt did not show for appointment, even after calling yesterday to move and confirm appointment time.  Pt reports she is trying to get her car fixed and the time totally slipped up on her.  Explained this is her 2nd consecutive NS and going forward she will have to schedule 1 appt at a time and if she misses her next appointment she will be discharged.  Pt verbalized understanding.   Lurena Nida, PTA/CLT [phone]

## 2018-11-14 ENCOUNTER — Ambulatory Visit (HOSPITAL_COMMUNITY): Payer: PRIVATE HEALTH INSURANCE | Admitting: Physical Therapy

## 2018-11-14 ENCOUNTER — Other Ambulatory Visit: Payer: Self-pay

## 2018-11-14 DIAGNOSIS — M25572 Pain in left ankle and joints of left foot: Secondary | ICD-10-CM

## 2018-11-14 DIAGNOSIS — R29898 Other symptoms and signs involving the musculoskeletal system: Secondary | ICD-10-CM | POA: Diagnosis present

## 2018-11-14 DIAGNOSIS — M25672 Stiffness of left ankle, not elsewhere classified: Secondary | ICD-10-CM | POA: Diagnosis present

## 2018-11-14 NOTE — Therapy (Signed)
[facility] [address] [place], [place], [address] Phone: [phone]   Fax:  [phone]  Physical Therapy Treatment  Patient Details  Name: Leah Phillips MRN: [id number] Date of Birth: 1970/07/07 Referring Provider (PT): Eldred MangesYates, Mark C, MD   Encounter Date: 11/14/2018  PT End of Session - 11/14/18 1630    Visit Number  5    Number of Visits  12    Date for PT Re-Evaluation  12/09/18    Authorization Type  [employer] [place] (30 visits PT/OT combined - 0 used at eval, no auth required)    Authorization Time Period  10/28/18-12/09/18    Authorization - Visit Number  5    Authorization - Number of Visits  30    PT Start Time  1535    PT Stop Time  1625    PT Time Calculation (min)  50 min    Activity Tolerance  Patient tolerated treatment well    Behavior During Therapy  [facility] for tasks assessed/performed       Past Medical History:  Diagnosis Date  . Carpal tunnel syndrome     Past Surgical History:  Procedure Laterality Date  . ANTERIOR CRUCIATE LIGAMENT REPAIR Left   . CARPAL TUNNEL RELEASE Right 04/01/2015   Procedure: Right Carpal Tunnel Release;  Surgeon: Eldred MangesMark C Yates, MD;  Location: [facility];  Service: Orthopedics;  Laterality: Right;  . CHOLECYSTECTOMY    . EXCISION METACARPAL MASS Right 04/01/2015   Procedure: Excision Right Long Finger Mass;  Surgeon: Eldred MangesMark C Yates, MD;  Location: [facility];  Service: Orthopedics;  Laterality: Right;    There were no vitals filed for this visit.  Subjective Assessment - 11/14/18 1541    Subjective  pt states she is sore today, 3/10 rating for pain related to soreness.      Currently in Pain?  Yes    Pain Score  3     Pain Location  Heel    Pain Orientation  Left    Pain Descriptors / Indicators  Sore    Pain Type  Chronic pain                       [facility] Adult PT Treatment/Exercise - 11/14/18 0001      Modalities   Modalities   Cryotherapy      Cryotherapy   Number Minutes Cryotherapy  8 Minutes    Cryotherapy Location  Ankle    Type of Cryotherapy  Ice massage      Manual Therapy   Manual Therapy  Soft tissue mobilization    Manual therapy comments  separate rest of treatment    Soft tissue mobilization  STM after needling to gastroc soleus mm belly and cross friction to Achilles tendon to reduce restrictions and pain      Ankle Exercises: Stretches   Slant Board Stretch  3 reps;30 seconds      Ankle Exercises: Standing   BAPS  Standing;Level 2;Limitations    SLS  both 30" holds without UE    Other Standing Ankle Exercises  Heel Raise variations: bil raise, with LLE eccentric lowering: 1x 20 reps; isometric holds 10x10" weight mostly on LLE, on slant board BLE x15    Other Standing Ankle Exercises  standing hip abduction, extension 10 reps each                PT Short Term Goals - 10/28/18  1219      PT SHORT TERM GOAL #1   Title  Patient will be independent with HEP, updated PRN, to improve fucntional strength of LE and tolerance to loading Lt achilles tendon.    Time  2    Period  Weeks    Status  New    Target Date  11/11/18      PT SHORT TERM GOAL #2   Title  Patient will have no pain with resistance testing via MMT for Rt ankle to indicate improved tolerance to muscle activation and load.     Time  3    Period  Weeks    Status  New    Target Date  11/18/18        PT Long Term Goals - 10/28/18 1221      PT LONG TERM GOAL #1   Title  Patient will make 10% improvement or greater in FOTO inidicating significant reduction in self reported limitations related to Lt ankle pain.     Time  6    Period  Weeks    Status  New    Target Date  12/09/18      PT LONG TERM GOAL #2   Title  Patient will improve Lt ankle AROM to be painfree and equal Rt ankle and fall within functional limits.    Time  6    Period  Weeks    Status  New      PT LONG TERM GOAL #3   Title  Patient will  report being able to walk for 2 hours or more without requiring a break due to Lt ankle pain indicating improved activity tolerance and improving QOL.    Time  6    Period  Weeks    Status  New            Plan - 11/14/18 1631    Clinical Impression Statement  Continued with stretching and strengthening exercises to help decrease pain and improve function.  Able to progress to standing BAPS today and increase reps.  No diffiuclty or pain voiced with maintaining SLS for 30 seconds on bilateral LE's.  Sensitivity noted with manual in certain areas along achilles tendon.  Added ice massage at end to decrease pain and inflammation.  Pt reported overall reduction of symptoms.  Given ice massage instructions to complete at home.     Personal Factors and Comorbidities  Comorbidity 1    Comorbidities  HTN    Examination-Activity Limitations  Stand;Locomotion Level    Stability/Clinical Decision Making  Stable/Uncomplicated    Rehab Potential  Good    PT Frequency  2x / week    PT Duration  6 weeks    PT Treatment/Interventions  ADLs/Self Care Home Management;Aquatic Therapy;Cryotherapy;Electrical Stimulation;Iontophoresis 4mg /ml Dexamethasone;Moist Heat;Functional mobility training;Therapeutic activities;Therapeutic exercise;Balance training;Neuromuscular re-education;Patient/family education;Manual techniques;Passive range of motion;Taping;Joint Manipulations;Dry needling    PT Next Visit Plan  contiue cross fircition massage to achilles as tolerated. Continue with high repetition of eccentric lowering on Lt LE and progress to step when able. continue 3-way anlke strengthening, dry needling when able if +response    PT Home Exercise Plan  Eval: eccentric lowering form heel raise; 5/29: isometric calf raises    Consulted and Agree with Plan of Care  Patient       Patient will benefit from skilled therapeutic intervention in order to improve the following deficits and impairments:  Abnormal gait,  Decreased activity tolerance, Decreased range of motion, Decreased strength,  Difficulty walking, Hypomobility, Pain, Increased fascial restricitons, Impaired flexibility  Visit Diagnosis: Pain in left ankle and joints of left foot  Stiffness of left ankle, not elsewhere classified  Other symptoms and signs involving the musculoskeletal system     Problem List Patient Active Problem List   Diagnosis Date Noted  . Partial tear of left Achilles tendon 08/25/2018  . Bunion of great toe of right foot 09/02/2017  . Ganglion cyst of finger of right hand 02/04/2017  . Acute pain of left knee 01/14/2017  . Right elbow pain 01/14/2017  . Pes anserinus bursitis 02/15/2014  . PRURITUS 10/15/2009  . FATIGUE 07/23/2009  . SHOULDER PAIN, RIGHT 06/21/2009  . NECK PAIN 05/06/2009  . BACK PAIN 02/21/2009  . HYPERTENSION 10/02/2008  . KNEE PAIN, RIGHT 09/27/2008  . FOLLICULITIS 57/32/2025  . UNSPECIFIED VAGINITIS AND VULVOVAGINITIS 01/04/2008  . 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 OBESITY, UNSPECIFIED 11/11/2007  . ALLERGIC RHINITIS, SEASONAL 11/11/2007  . A C L SPRAIN-CHRONIC 06/23/2007  . TEAR A C L 06/23/2007   Teena Irani, PTA/CLT [phone]  Teena Irani 11/14/2018, 4:35 PM  [facility] [address] [place], [place], [address] Phone: [phone]   Fax:  [phone]  Name: SHONNA DEITER MRN: [id number] Date of Birth: [birth year]

## 2018-11-18 ENCOUNTER — Ambulatory Visit (HOSPITAL_COMMUNITY): Payer: PRIVATE HEALTH INSURANCE

## 2018-11-18 ENCOUNTER — Encounter (HOSPITAL_COMMUNITY): Payer: Self-pay

## 2018-11-18 ENCOUNTER — Other Ambulatory Visit: Payer: Self-pay

## 2018-11-18 DIAGNOSIS — M25572 Pain in left ankle and joints of left foot: Secondary | ICD-10-CM | POA: Diagnosis not present

## 2018-11-18 DIAGNOSIS — M25672 Stiffness of left ankle, not elsewhere classified: Secondary | ICD-10-CM

## 2018-11-18 DIAGNOSIS — R29898 Other symptoms and signs involving the musculoskeletal system: Secondary | ICD-10-CM

## 2018-11-18 NOTE — Therapy (Signed)
[facility] [address] [place], [place], [address] Phone: [phone]   Fax:  [phone]  Physical Therapy Treatment  Patient Details  Name: Leah Phillips MRN: [id number] Date of Birth: Jan 14, 1971 Referring Provider (PT): Eldred MangesYates, Mark C, MD   Encounter Date: 11/18/2018  PT End of Session - 11/18/18 0959    Visit Number  6    Number of Visits  12    Date for PT Re-Evaluation  12/09/18    Authorization Type  [employer] [place] (30 visits PT/OT combined - 0 used at eval, no auth required)    Authorization Time Period  10/28/18-12/09/18    Authorization - Visit Number  6    Authorization - Number of Visits  30    PT Start Time  0929    PT Stop Time  1013    PT Time Calculation (min)  44 min    Activity Tolerance  Patient tolerated treatment well    Behavior During Therapy  [facility] for tasks assessed/performed       Past Medical History:  Diagnosis Date  . Carpal tunnel syndrome     Past Surgical History:  Procedure Laterality Date  . ANTERIOR CRUCIATE LIGAMENT REPAIR Left   . CARPAL TUNNEL RELEASE Right 04/01/2015   Procedure: Right Carpal Tunnel Release;  Surgeon: Eldred MangesMark C Yates, MD;  Location: [facility];  Service: Orthopedics;  Laterality: Right;  . CHOLECYSTECTOMY    . EXCISION METACARPAL MASS Right 04/01/2015   Procedure: Excision Right Long Finger Mass;  Surgeon: Eldred MangesMark C Yates, MD;  Location: [facility];  Service: Orthopedics;  Laterality: Right;    There were no vitals filed for this visit.  Subjective Assessment - 11/18/18 0931    Subjective  Patient states she needed to come in early to be able to go to a wake for a friend that passed away. She reports about 2/10 pain this date. She states she feels that the ice made her heel hurt more after last session.    Pertinent History  bil bunionectomy    Limitations  Walking    Currently in Pain?  Yes    Pain Score  2     Pain Location  Heel    Pain  Orientation  Left    Pain Descriptors / Indicators  Sore    Pain Type  Chronic pain    Pain Onset  More than a month ago    Pain Frequency  Constant        [facility] Adult PT Treatment/Exercise - 11/18/18 0001      Manual Therapy   Manual Therapy  Soft tissue mobilization    Manual therapy comments  separate rest of treatment    Soft tissue mobilization  STM after needling to gastroc soleus mm belly and cross friction to Achilles tendon to reduce restrictions and pain      Ankle Exercises: Standing   BAPS  Standing;Level 2;Limitations    BAPS Limitations  2 sets: 20x DF/PF, 20x clockwise/counterclockwise    Heel Walk (Round Trip)  2x 15' RT    Toe Walk (Round Trip)  2x 15' RT    Other Standing Ankle Exercises  Heel Raise variations: bil raise, with Lt LE eccentric lowering: 3x 15 reps; isometric holds 10x10" weight mostly on LLE    Other Standing Ankle Exercises  SLS: 3x 30 sec bil LE on foam      Ankle Exercises: Musiciantretches   Slant Board  Stretch  3 reps;30 seconds    Other Stretch  10x 10 seconds knee drives on 12" box, with AP [facility] for ankle dorsiflexion        PT Short Term Goals - 10/28/18 1219      PT SHORT TERM GOAL #1   Title  Patient will be independent with HEP, updated PRN, to improve fucntional strength of LE and tolerance to loading Lt achilles tendon.    Time  2    Period  Weeks    Status  New    Target Date  11/11/18      PT SHORT TERM GOAL #2   Title  Patient will have no pain with resistance testing via MMT for Rt ankle to indicate improved tolerance to muscle activation and load.     Time  3    Period  Weeks    Status  New    Target Date  11/18/18        PT Long Term Goals - 10/28/18 1221      PT LONG TERM GOAL #1   Title  Patient will make 10% improvement or greater in FOTO inidicating significant reduction in self reported limitations related to Lt ankle pain.     Time  6    Period  Weeks    Status  New    Target Date   12/09/18      PT LONG TERM GOAL #2   Title  Patient will improve Lt ankle AROM to be painfree and equal Rt ankle and fall within functional limits.    Time  6    Period  Weeks    Status  New      PT LONG TERM GOAL #3   Title  Patient will report being able to walk for 2 hours or more without requiring a break due to Lt ankle pain indicating improved activity tolerance and improving QOL.    Time  6    Period  Weeks    Status  New        Plan - 11/18/18 1208    Clinical Impression Statement  This sesssion conitnued with focus on loadign the Lt achilles tendon via eccentric and isometric strengthening. Increased repetitions for all heel raise exercises and initiated heel/toe walking for isometric strengthening. Patient reported some discomfort with toe walking. EOS continued with manual and soft tissue mobilization to gastroc/soleus followed by cross friction massaage to achilles tendon. Pt reported tenderness and ice massage was held due to increased pt reporting increased pain from this intervention. She will continue to benefit from skilled PT interventions to address impairments and progress therapy as able.    Personal Factors and Comorbidities  Comorbidity 1    Comorbidities  HTN    Examination-Activity Limitations  Stand;Locomotion Level    Stability/Clinical Decision Making  Stable/Uncomplicated    Rehab Potential  Good    PT Frequency  2x / week    PT Duration  6 weeks    PT Treatment/Interventions  ADLs/Self Care Home Management;Aquatic Therapy;Cryotherapy;Electrical Stimulation;Iontophoresis 4mg /ml Dexamethasone;Moist Heat;Functional mobility training;Therapeutic activities;Therapeutic exercise;Balance training;Neuromuscular re-education;Patient/family education;Manual techniques;Passive range of motion;Taping;Joint Manipulations;Dry needling    PT Next Visit Plan  Update HEP. contiue cross fircition massage to achilles as tolerated. Continue with high repetition of eccentric  lowering on Lt LE and progress to step when able. continue 3-way anlke strengthening, dry needling when able if +response    PT Home Exercise Plan  Eval: eccentric lowering form  heel raise; 5/29: isometric calf raises    Consulted and Agree with Plan of Care  Patient       Patient will benefit from skilled therapeutic intervention in order to improve the following deficits and impairments:  Abnormal gait, Decreased activity tolerance, Decreased range of motion, Decreased strength, Difficulty walking, Hypomobility, Pain, Increased fascial restricitons, Impaired flexibility  Visit Diagnosis: Pain in left ankle and joints of left foot   Stiffness of left ankle, not elsewhere classified   Other symptoms and signs involving the musculoskeletal system      Problem List Patient Active Problem List   Diagnosis Date Noted  . Partial tear of left Achilles tendon 08/25/2018  . Bunion of great toe of right foot 09/02/2017  . Ganglion cyst of finger of right hand 02/04/2017  . Acute pain of left knee 01/14/2017  . Right elbow pain 01/14/2017  . Pes anserinus bursitis 02/15/2014  . PRURITUS 10/15/2009  . FATIGUE 07/23/2009  . SHOULDER PAIN, RIGHT 06/21/2009  . NECK PAIN 05/06/2009  . BACK PAIN 02/21/2009  . HYPERTENSION 10/02/2008  . KNEE PAIN, RIGHT 09/27/2008  . FOLLICULITIS 73/42/8768  . UNSPECIFIED VAGINITIS AND VULVOVAGINITIS 01/04/2008  . 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 OBESITY, UNSPECIFIED 11/11/2007  . ALLERGIC RHINITIS, SEASONAL 11/11/2007  . A C L SPRAIN-CHRONIC 06/23/2007  . TEAR A C L 06/23/2007    Kipp Brood, PT, DPT, [facility] Physical Therapist with [facility]  11/18/2018 12:11 PM    [facility] [address] [place], [place], [address] Phone: [phone]   Fax:  [phone]  Name: Leah Phillips MRN: [id number] Date of Birth: March 28, 1971

## 2018-11-21 ENCOUNTER — Ambulatory Visit (HOSPITAL_COMMUNITY): Payer: PRIVATE HEALTH INSURANCE

## 2018-11-21 ENCOUNTER — Telehealth (HOSPITAL_COMMUNITY): Payer: Self-pay | Admitting: Physical Therapy

## 2018-11-21 NOTE — Telephone Encounter (Signed)
Patient called to cancel today's appointment, no reason given, and rescheduled for tomorrow at 11:15.   Clarene Critchley PT, DPT 2:35 PM, 11/21/18 [phone]

## 2018-11-22 ENCOUNTER — Other Ambulatory Visit: Payer: Self-pay

## 2018-11-22 ENCOUNTER — Encounter (HOSPITAL_COMMUNITY): Payer: Self-pay

## 2018-11-22 ENCOUNTER — Ambulatory Visit (HOSPITAL_COMMUNITY): Payer: PRIVATE HEALTH INSURANCE

## 2018-11-22 DIAGNOSIS — M25572 Pain in left ankle and joints of left foot: Secondary | ICD-10-CM

## 2018-11-22 DIAGNOSIS — R29898 Other symptoms and signs involving the musculoskeletal system: Secondary | ICD-10-CM

## 2018-11-22 DIAGNOSIS — M25672 Stiffness of left ankle, not elsewhere classified: Secondary | ICD-10-CM

## 2018-11-22 NOTE — Therapy (Signed)
[facility], [facility] [address] [place], [place], [address] Phone: [phone]   Fax:  [phone]  Physical Therapy Treatment  Patient Details  Name: Leah Phillips Hoak MRN: [id number] Date of Birth: 1970/11/24 Referring Provider (PT): Eldred MangesYates, Mark C, MD   Encounter Date: 11/22/2018  PT End of Session - 11/22/18 1113    Visit Number  7    Number of Visits  12    Date for PT Re-Evaluation  12/09/18    Authorization Type  [employer] (30 visits PT/OT combined - 0 used at eval, no auth required)    Authorization Time Period  10/28/18-12/09/18    Authorization - Visit Number  7    Authorization - Number of Visits  30    PT Start Time  1115    PT Stop Time  1157    PT Time Calculation (min)  42 min    Activity Tolerance  Patient tolerated treatment well    Behavior During Therapy  [facility] for tasks assessed/performed       Past Medical History:  Diagnosis Date  . Carpal tunnel syndrome     Past Surgical History:  Procedure Laterality Date  . ANTERIOR CRUCIATE LIGAMENT REPAIR Left   . CARPAL TUNNEL RELEASE Right 04/01/2015   Procedure: Right Carpal Tunnel Release;  Surgeon: Eldred MangesMark C Yates, MD;  Location: [facility];  Service: Orthopedics;  Laterality: Right;  . CHOLECYSTECTOMY    . EXCISION METACARPAL MASS Right 04/01/2015   Procedure: Excision Right Long Finger Mass;  Surgeon: Eldred MangesMark C Yates, MD;  Location: [facility];  Service: Orthopedics;  Laterality: Right;    There were no vitals filed for this visit.  Subjective Assessment - 11/22/18 1112    Subjective  Pt reports she did a lot this weekend and is now hurting. Pt reports positive results from dry needling last session.    Pertinent History  bil bunionectomy    Limitations  Walking    How long can you stand comfortably?  about 1 hour - makes knee hurt    Currently in Pain?  Yes    Pain Score  3     Pain Location  Heel    Pain Orientation  Left    Pain  Descriptors / Indicators  Sore    Pain Type  Chronic pain    Pain Onset  More than a month ago    Pain Frequency  Constant         [facility] Adult PT Treatment/Exercise - 11/22/18 0001      Manual Therapy   Manual Therapy  Soft tissue mobilization    Manual therapy comments  separate rest of treatment    Soft tissue mobilization  STM after needling to gastroc soleus mm belly and cross friction to Achilles tendon to reduce restrictions and pain      Ankle Exercises: Seated   Towel Crunch  --   seated, 2x20 reps   Other Seated Ankle Exercises  arch doming; verbal and tactile cues for form      Ankle Exercises: Stretches   Slant Board Stretch  3 reps;30 seconds      Ankle Exercises: Standing   Warrior I  multiple reps of step downs off 7" step -- pt noted to have increased pes planus and hallux valgus bilaterally       Trigger Point Dry Needling - 11/22/18 0001    Consent Given?  Yes    Education Handout  Provided  Previously provided    Muscles Treated Lower Quadrant  Gastrocnemius;Soleus    Gastrocnemius Response  Twitch response elicited;Palpable increased muscle length   L in prone   Soleus Response  Twitch response elicited;Palpable increased muscle length   L in prone          PT Education - 11/22/18 1113    Education Details  exercise technique, continue HEP, expect muscle soreness from dry needling    Person(Phillips) Educated  Patient    Methods  Explanation;Handout;Demonstration;Tactile cues;Verbal cues    Comprehension  Verbalized understanding;Returned demonstration       PT Short Term Goals - 10/28/18 1219      PT SHORT TERM GOAL #1   Title  Patient will be independent with HEP, updated PRN, to improve fucntional strength of LE and tolerance to loading Lt achilles tendon.    Time  2    Period  Weeks    Status  New    Target Date  11/11/18      PT SHORT TERM GOAL #2   Title  Patient will have no pain with resistance testing via MMT for Rt ankle to indicate  improved tolerance to muscle activation and load.     Time  3    Period  Weeks    Status  New    Target Date  11/18/18        PT Long Term Goals - 10/28/18 1221      PT LONG TERM GOAL #1   Title  Patient will make 10% improvement or greater in FOTO inidicating significant reduction in self reported limitations related to Lt ankle pain.     Time  6    Period  Weeks    Status  New    Target Date  12/09/18      PT LONG TERM GOAL #2   Title  Patient will improve Lt ankle AROM to be painfree and equal Rt ankle and fall within functional limits.    Time  6    Period  Weeks    Status  New      PT LONG TERM GOAL #3   Title  Patient will report being able to walk for 2 hours or more without requiring a break due to Lt ankle pain indicating improved activity tolerance and improving QOL.    Time  6    Period  Weeks    Status  New            Plan - 11/22/18 1143    Clinical Impression Statement  Resumed dry needling this date to pt'Phillips L calf; good twitch response elicited and followed up with manual STM afterwards for further reduced soft tissue restrictions, pain reduction, and post-needling soreness. Assessed pt'Phillips feet in WB and pt noted to have significant pes planus; educated pt on benefit of purchasing shoe inserts to provide longitudinal arch support and she verbalized understanding. Pt also noted to have increased hallux valgus during step downs; feel this is due to her pes planus and lack of ankle DF ROM. Began more isolated foot intrinsic mm strengthening and updated HEP. Pt reported feeling a little sore in her ankle at EOS but no increased pain. Pt due for reassessment next visit.    Personal Factors and Comorbidities  Comorbidity 1    Comorbidities  HTN    Examination-Activity Limitations  Stand;Locomotion Level    Stability/Clinical Decision Making  Stable/Uncomplicated    Rehab Potential  Good  PT Frequency  2x / week    PT Duration  6 weeks    PT  Treatment/Interventions  ADLs/Self Care Home Management;Aquatic Therapy;Cryotherapy;Electrical Stimulation;Iontophoresis 4mg /ml Dexamethasone;Moist Heat;Functional mobility training;Therapeutic activities;Therapeutic exercise;Balance training;Neuromuscular re-education;Patient/family education;Manual techniques;Passive range of motion;Taping;Joint Manipulations;Dry needling    PT Next Visit Plan  reassessment; f/u on shoe insert purchase; intrinsic foot strength; Continue cross fircition massage to achilles as tolerated. Continue with high repetition of eccentric lowering on Lt LE and progress to step when able. continue 3-way anlke strengthening, dry needling when able if +response    PT Home Exercise Plan  Eval: eccentric lowering form heel raise; 5/29: isometric calf raises; 6/16: arch raise, towel scrunch    Consulted and Agree with Plan of Care  Patient       Patient will benefit from skilled therapeutic intervention in order to improve the following deficits and impairments:  Abnormal gait, Decreased activity tolerance, Decreased range of motion, Decreased strength, Difficulty walking, Hypomobility, Pain, Increased fascial restricitons, Impaired flexibility  Visit Diagnosis: 1. Pain in left ankle and joints of left foot   2. Stiffness of left ankle, not elsewhere classified   3. Other symptoms and signs involving the musculoskeletal system        Problem List Patient Active Problem List   Diagnosis Date Noted  . Partial tear of left Achilles tendon 08/25/2018  . Bunion of great toe of right foot 09/02/2017  . Ganglion cyst of finger of right hand 02/04/2017  . Acute pain of left knee 01/14/2017  . Right elbow pain 01/14/2017  . Pes anserinus bursitis 02/15/2014  . PRURITUS 10/15/2009  . FATIGUE 07/23/2009  . SHOULDER PAIN, RIGHT 06/21/2009  . NECK PAIN 05/06/2009  . BACK PAIN 02/21/2009  . HYPERTENSION 10/02/2008  . KNEE PAIN, RIGHT 09/27/2008  . FOLLICULITIS 07/29/2008  .  UNSPECIFIED VAGINITIS AND VULVOVAGINITIS 01/04/2008  . 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 OBESITY, UNSPECIFIED 11/11/2007  . ALLERGIC RHINITIS, SEASONAL 11/11/2007  . A C L SPRAIN-CHRONIC 06/23/2007  . TEAR A C L 06/23/2007     Jac CanavanBrooke Jasnoor Trussell PT, DPT  [facility] [address] [place], [place], [address] Phone: [phone]   Fax:  [phone]  Name: Leah Phillips Passow MRN: [id number] Date of Birth: [birth year]

## 2018-11-25 ENCOUNTER — Other Ambulatory Visit: Payer: Self-pay

## 2018-11-25 ENCOUNTER — Encounter (HOSPITAL_COMMUNITY): Payer: Self-pay

## 2018-11-25 ENCOUNTER — Ambulatory Visit (HOSPITAL_COMMUNITY): Payer: PRIVATE HEALTH INSURANCE

## 2018-11-25 DIAGNOSIS — M25672 Stiffness of left ankle, not elsewhere classified: Secondary | ICD-10-CM

## 2018-11-25 DIAGNOSIS — R29898 Other symptoms and signs involving the musculoskeletal system: Secondary | ICD-10-CM

## 2018-11-25 DIAGNOSIS — M25572 Pain in left ankle and joints of left foot: Secondary | ICD-10-CM | POA: Diagnosis not present

## 2018-11-25 NOTE — Therapy (Signed)
[place], [place], [address] Phone: [phone]   Fax:  [phone]  Physical Therapy Treatment  Patient Details  Name: Leah Phillips MRN: [id number] Date of Birth: [birth year] Referring Provider (PT): Marybelle Killings, MD   Encounter Date: 11/25/2018  PT End of Session - 11/25/18 1607    Visit Number  8    Number of Visits  12    Date for PT Re-Evaluation  12/09/18    Authorization Type  [employer] (30 visits PT/OT combined - 0 used at eval, no auth required)    Authorization Time Period  10/28/18-12/09/18    Authorization - Visit Number  8    Authorization - Number of Visits  30    PT Start Time  8563    PT Stop Time  1602    PT Time Calculation (min)  41 min    Activity Tolerance  Patient tolerated treatment well    Behavior During Therapy  [facility] for tasks assessed/performed       Past Medical History:  Diagnosis Date  . Carpal tunnel syndrome     Past Surgical History:  Procedure Laterality Date  . ANTERIOR CRUCIATE LIGAMENT REPAIR Left   . CARPAL TUNNEL RELEASE Right 04/01/2015   Procedure: Right Carpal Tunnel Release;  Surgeon: Marybelle Killings, MD;  Location: [facility];  Service: Orthopedics;  Laterality: Right;  . CHOLECYSTECTOMY    . EXCISION METACARPAL MASS Right 04/01/2015   Procedure: Excision Right Long Finger Mass;  Surgeon: Marybelle Killings, MD;  Location: [facility];  Service: Orthopedics;  Laterality: Right;    There were no vitals filed for this visit.  Subjective Assessment - 11/25/18 1608    Subjective  Patient reports she is doing well today and denies pain at start of session. She states her feet are a little more swollen than usual as she has been on her feet a lot this week. She reports the dry needling helped a lot after last session.    Pertinent History  bil bunionectomy    Limitations  Walking    How long can you stand comfortably?  about 1 hour - makes  knee hurt    Currently in Pain?  No/denies    Pain Onset  --       [facility] Adult PT Treatment/Exercise - 11/25/18 0001      Manual Therapy   Manual Therapy  Soft tissue mobilization    Manual therapy comments  separate rest of treatment    Soft tissue mobilization  STM to gastroc, soleus, and around achilles tendon to relieve pain and address swelling      Ankle Exercises: Standing   BAPS  Standing;Level 2;Limitations    BAPS Limitations  2 sets: 20x DF/PF, 20x clockwise/counterclockwise    Heel Raises  Limitations    Heel Raises Limitations  Heel Raise variations: 2x 20 reps bil riase with eccentric lowering on Lt LE on 4" step; 10x 10 sec holds isometric holds in split soleus squat position (bil)    Toe Raise  15 reps;Limitations    Toe Raise Limitations  2 sets    Heel Walk (Round Trip)  2x 15' RT    Toe Walk (Round Trip)  2x 15' RT    Other Standing Ankle Exercises  --    Other Standing Ankle Exercises  Short foot exercises: 2x 15 reps        PT Education -  11/25/18 1611    Education Details  Educated on purpose of exercises and on form for new exercises.    Person(s) Educated  Patient    Methods  Explanation;Demonstration    Comprehension  Verbalized understanding;Returned demonstration       PT Short Term Goals - 10/28/18 1219      PT SHORT TERM GOAL #1   Title  Patient will be independent with HEP, updated PRN, to improve fucntional strength of LE and tolerance to loading Lt achilles tendon.    Time  2    Period  Weeks    Status  New    Target Date  11/11/18      PT SHORT TERM GOAL #2   Title  Patient will have no pain with resistance testing via MMT for Rt ankle to indicate improved tolerance to muscle activation and load.     Time  3    Period  Weeks    Status  New    Target Date  11/18/18        PT Long Term Goals - 10/28/18 1221      PT LONG TERM GOAL #1   Title  Patient will make 10% improvement or greater in FOTO inidicating significant reduction in  self reported limitations related to Lt ankle pain.     Time  6    Period  Weeks    Status  New    Target Date  12/09/18      PT LONG TERM GOAL #2   Title  Patient will improve Lt ankle AROM to be painfree and equal Rt ankle and fall within functional limits.    Time  6    Period  Weeks    Status  New      PT LONG TERM GOAL #3   Title  Patient will report being able to walk for 2 hours or more without requiring a break due to Lt ankle pain indicating improved activity tolerance and improving QOL.    Time  6    Period  Weeks    Status  New        Plan - 11/25/18 1608    Clinical Impression Statement  Patient arrived with no pain and exercises were progressed in standing today. She was able to complete heel raises with eccentric lowering on step for increased ROM; she did report burning in her foot and ankle on Lt LE with this exercise but states this is typical. Isometric heels raises were advanced to split soleus squat with 10 second holds and patient able to complete bil. She had greater difficulty with Lt LE in knee bent soleus position compared to when Lt foot place posteriorly activating gastrocnemius. Patient was instructed in short foot exercises in standing and required tactile cues initially to achieve proper form but improved with repetition. She will benefit from continued skilled PT interventions including dry needling to address impairments and progress towards goals.    Personal Factors and Comorbidities  Comorbidity 1    Comorbidities  HTN    Examination-Activity Limitations  Stand;Locomotion Level    Stability/Clinical Decision Making  Stable/Uncomplicated    Rehab Potential  Good    PT Frequency  2x / week    PT Duration  6 weeks    PT Treatment/Interventions  ADLs/Self Care Home Management;Aquatic Therapy;Cryotherapy;Electrical Stimulation;Iontophoresis 4mg /ml Dexamethasone;Moist Heat;Functional mobility training;Therapeutic activities;Therapeutic exercise;Balance  training;Neuromuscular re-education;Patient/family education;Manual techniques;Passive range of motion;Taping;Joint Manipulations;Dry needling    PT Next Visit Plan  Reassess.  Continue to perform standing exercises with intrinsic foot strength and isometric/eccentric calf exercises. Continue cross fircition massage to achilles as tolerated. Perform dry needling PRN.    PT Home Exercise Plan  Eval: eccentric lowering form heel raise; 5/29: isometric calf raises; 6/16: arch raise, towel scrunch    Consulted and Agree with Plan of Care  Patient       Patient will benefit from skilled therapeutic intervention in order to improve the following deficits and impairments:  Abnormal gait, Decreased activity tolerance, Decreased range of motion, Decreased strength, Difficulty walking, Hypomobility, Pain, Increased fascial restricitons, Impaired flexibility  Visit Diagnosis: 1. Pain in left ankle and joints of left foot   2. Stiffness of left ankle, not elsewhere classified   3. Other symptoms and signs involving the musculoskeletal system        Problem List Patient Active Problem List   Diagnosis Date Noted  . Partial tear of left Achilles tendon 08/25/2018  . Bunion of great toe of right foot 09/02/2017  . Ganglion cyst of finger of right hand 02/04/2017  . Acute pain of left knee 01/14/2017  . Right elbow pain 01/14/2017  . Pes anserinus bursitis 02/15/2014  . PRURITUS 10/15/2009  . FATIGUE 07/23/2009  . SHOULDER PAIN, RIGHT 06/21/2009  . NECK PAIN 05/06/2009  . BACK PAIN 02/21/2009  . HYPERTENSION 10/02/2008  . KNEE PAIN, RIGHT 09/27/2008  . FOLLICULITIS 07/29/2008  . UNSPECIFIED VAGINITIS AND VULVOVAGINITIS 01/04/2008  . 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 OBESITY, UNSPECIFIED 11/11/2007  . ALLERGIC RHINITIS, SEASONAL 11/11/2007  . A C L SPRAIN-CHRONIC 06/23/2007  . TEAR A C L 06/23/2007    Valentino Saxonachel Quinn-Brown, PT, DPT, [facility] Physical Therapist with [facility]  11/25/2018 4:13 PM    [facility] [address] [place], [place], [address] Phone: [phone]   Fax:  [phone]  Name: Donah Driverasha S Ciampi MRN: [id number] Date of Birth: 19-Jul-1970

## 2018-11-28 ENCOUNTER — Ambulatory Visit (HOSPITAL_COMMUNITY): Payer: PRIVATE HEALTH INSURANCE

## 2018-11-28 ENCOUNTER — Encounter (HOSPITAL_COMMUNITY): Payer: Self-pay

## 2018-11-28 ENCOUNTER — Other Ambulatory Visit: Payer: Self-pay

## 2018-11-28 DIAGNOSIS — M25672 Stiffness of left ankle, not elsewhere classified: Secondary | ICD-10-CM

## 2018-11-28 DIAGNOSIS — M25572 Pain in left ankle and joints of left foot: Secondary | ICD-10-CM

## 2018-11-28 DIAGNOSIS — R29898 Other symptoms and signs involving the musculoskeletal system: Secondary | ICD-10-CM

## 2018-11-28 NOTE — Therapy (Signed)
[facility] [address] [place], [place], [address] Phone: [phone]   Fax:  [phone]  Physical Therapy Treatment  Patient Details  Name: Leah Phillips MRN: [id number] Date of Birth: 17-Oct-1970 Referring Provider (PT): Eldred MangesYates, Mark C, MD   Encounter Date: 11/28/2018  PT End of Session - 11/28/18 1527    Visit Number  9    Number of Visits  12    Date for PT Re-Evaluation  12/09/18    Authorization Type  [employer] (30 visits PT/OT combined - 0 used at eval, no auth required)    Authorization Time Period  10/28/18-12/09/18    Authorization - Visit Number  9    Authorization - Number of Visits  30    PT Start Time  1523    PT Stop Time  1605    PT Time Calculation (min)  42 min    Activity Tolerance  Patient tolerated treatment well    Behavior During Therapy  [facility] for tasks assessed/performed       Past Medical History:  Diagnosis Date  . Carpal tunnel syndrome     Past Surgical History:  Procedure Laterality Date  . ANTERIOR CRUCIATE LIGAMENT REPAIR Left   . CARPAL TUNNEL RELEASE Right 04/01/2015   Procedure: Right Carpal Tunnel Release;  Surgeon: Eldred MangesMark C Yates, MD;  Location: [facility];  Service: Orthopedics;  Laterality: Right;  . CHOLECYSTECTOMY    . EXCISION METACARPAL MASS Right 04/01/2015   Procedure: Excision Right Long Finger Mass;  Surgeon: Eldred MangesMark C Yates, MD;  Location: [facility];  Service: Orthopedics;  Laterality: Right;    There were no vitals filed for this visit.  Subjective Assessment - 11/28/18 1526    Subjective  Pt reports she didn't have any problems over this weekened, but she took it easy.    Pertinent History  bil bunionectomy    Limitations  Walking    How long can you stand comfortably?  about 1 hour - makes knee hurt    Currently in Pain?  No/denies             [facility] - 11/28/18 0001      Manual Therapy   Manual Therapy   Soft tissue mobilization;Joint mobilization    Manual therapy comments  separate rest of treatment    Joint Mobilization  Grade III-IV AP talcocrural joint mobs for improved DF; Grade III-IV talocrural joint distraction for capsule stretching and pain control    Soft tissue mobilization  STM to gastroc, soleus, and around achilles tendon to relieve pain and address swelling      Ankle Exercises: Seated   [place] Pickup  2x15 reps    Other Seated Ankle Exercises  arch doming; verbal and tactile cues for form   Pen and penny tactile cues     Ankle Exercises: Standing   Other Standing Ankle Exercises  L reverse ski lunge on airex, x10 reps    Other Standing Ankle Exercises  L SLS with pallof press, x10 reps RTB       Trigger Point Dry Needling - 11/28/18 0001    Consent Given?  Yes    Education Handout Provided  Previously provided    Muscles Treated Lower Quadrant  Gastrocnemius;Soleus    Other Dry Needling  L in prone    Gastrocnemius Response  Twitch response elicited;Palpable increased muscle length    Soleus Response  Twitch response elicited;Palpable increased  muscle length           PT Education - 11/28/18 1526    Education Details  updated HEP, exercise technique, expect post dry needling soreness    Person(s) Educated  Patient    Methods  Explanation;Handout;Demonstration    Comprehension  Verbalized understanding;Returned demonstration       PT Short Term Goals - 10/28/18 1219      PT SHORT TERM GOAL #1   Title  Patient will be independent with HEP, updated PRN, to improve fucntional strength of LE and tolerance to loading Lt achilles tendon.    Time  2    Period  Weeks    Status  New    Target Date  11/11/18      PT SHORT TERM GOAL #2   Title  Patient will have no pain with resistance testing via MMT for Rt ankle to indicate improved tolerance to muscle activation and load.     Time  3    Period  Weeks    Status  New    Target Date  11/18/18        PT  Long Term Goals - 10/28/18 1221      PT LONG TERM GOAL #1   Title  Patient will make 10% improvement or greater in FOTO inidicating significant reduction in self reported limitations related to Lt ankle pain.     Time  6    Period  Weeks    Status  New    Target Date  12/09/18      PT LONG TERM GOAL #2   Title  Patient will improve Lt ankle AROM to be painfree and equal Rt ankle and fall within functional limits.    Time  6    Period  Weeks    Status  New      PT LONG TERM GOAL #3   Title  Patient will report being able to walk for 2 hours or more without requiring a break due to Lt ankle pain indicating improved activity tolerance and improving QOL.    Time  6    Period  Weeks    Status  New            Plan - 11/28/18 1528    Clinical Impression Statement  Continued with established POC this date by addressing trigger points/soft tissue restrictions and activating intrinsic foot mm. Pt continues with palpable knots in gastroc/soleus complex and good twitch responses elicited with dry needling. Followed up with manual STM following to promote reduced restrictions further and increased blood flow to further reduce pain and post-needling soreness. Pt continues to have difficulty with activating foot intrinsic mm; reports feeling in Achilles Tendon as well as medial and lateral to it. Began SLS on foam barefoot with dynamic UE and LE movements in order to activate foot intrinsic mm in standing. Pt with difficulty and noted increased hip weakness during. Will continue this mm activation in future sessions and added clams with GTB and sidelying hip abd to HEP for isolated glute strength.    Personal Factors and Comorbidities  Comorbidity 1    Comorbidities  HTN    Examination-Activity Limitations  Stand;Locomotion Level    Stability/Clinical Decision Making  Stable/Uncomplicated    Rehab Potential  Good    PT Frequency  2x / week    PT Duration  6 weeks    PT Treatment/Interventions   ADLs/Self Care Home Management;Aquatic Therapy;Cryotherapy;Electrical Stimulation;Iontophoresis 4mg /ml Dexamethasone;Moist Heat;Functional mobility training;Therapeutic  activities;Therapeutic exercise;Balance training;Neuromuscular re-education;Patient/family education;Manual techniques;Passive range of motion;Taping;Joint Manipulations;Dry needling    PT Next Visit Plan  Continue to perform standing exercises with intrinsic foot strength (SLS barefoot/in socks on foam +pallof press, reverse ski lunges, etc.) and isometric/eccentric calf exercises. Continue cross fircition massage to achilles as tolerated. Perform dry needling PRN.    PT Home Exercise Plan  Eval: eccentric lowering form heel raise; 5/29: isometric calf raises; 6/16: arch raise, towel scrunch; 6/22: clams with GTB, SL hip abd    Consulted and Agree with Plan of Care  Patient       Patient will benefit from skilled therapeutic intervention in order to improve the following deficits and impairments:  Abnormal gait, Decreased activity tolerance, Decreased range of motion, Decreased strength, Difficulty walking, Hypomobility, Pain, Increased fascial restricitons, Impaired flexibility  Visit Diagnosis: 1. Pain in left ankle and joints of left foot   2. Stiffness of left ankle, not elsewhere classified   3. Other symptoms and signs involving the musculoskeletal system        Problem List Patient Active Problem List   Diagnosis Date Noted  . Partial tear of left Achilles tendon 08/25/2018  . Bunion of great toe of right foot 09/02/2017  . Ganglion cyst of finger of right hand 02/04/2017  . Acute pain of left knee 01/14/2017  . Right elbow pain 01/14/2017  . Pes anserinus bursitis 02/15/2014  . PRURITUS 10/15/2009  . FATIGUE 07/23/2009  . SHOULDER PAIN, RIGHT 06/21/2009  . NECK PAIN 05/06/2009  . BACK PAIN 02/21/2009  . HYPERTENSION 10/02/2008  . KNEE PAIN, RIGHT 09/27/2008  . FOLLICULITIS 07/29/2008  . UNSPECIFIED  VAGINITIS AND VULVOVAGINITIS 01/04/2008  . 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 OBESITY, UNSPECIFIED 11/11/2007  . ALLERGIC RHINITIS, SEASONAL 11/11/2007  . A C L SPRAIN-CHRONIC 06/23/2007  . TEAR A C L 06/23/2007       Jac CanavanBrooke Topacio Cella PT, DPT    [facility] [address] [place], [place], [address] Phone: [phone]   Fax:  [phone]  Name: Leah Phillips MRN: [id number] Date of Birth: 1970-09-07

## 2018-11-30 ENCOUNTER — Ambulatory Visit (HOSPITAL_COMMUNITY): Payer: PRIVATE HEALTH INSURANCE

## 2018-11-30 ENCOUNTER — Other Ambulatory Visit: Payer: Self-pay

## 2018-11-30 ENCOUNTER — Encounter (HOSPITAL_COMMUNITY): Payer: Self-pay

## 2018-11-30 DIAGNOSIS — M25572 Pain in left ankle and joints of left foot: Secondary | ICD-10-CM | POA: Diagnosis not present

## 2018-11-30 DIAGNOSIS — M25672 Stiffness of left ankle, not elsewhere classified: Secondary | ICD-10-CM

## 2018-11-30 DIAGNOSIS — R29898 Other symptoms and signs involving the musculoskeletal system: Secondary | ICD-10-CM

## 2018-11-30 NOTE — Therapy (Signed)
[facility] [address] [place], [place], [address] Phone: [phone]   Fax:  [phone]  Physical Therapy Treatment  Patient Details  Name: Leah Phillips MRN: [id number] Date of Birth: 01/12/1971 Referring Provider (PT): Eldred MangesYates, Mark C, MD   Encounter Date: 11/30/2018  PT End of Session - 11/30/18 1126    Visit Number  10    Number of Visits  12    Date for PT Re-Evaluation  12/09/18    Authorization Type  [employer] [place] (30 visits PT/OT combined - 0 used at eval, no auth required)    Authorization Time Period  10/28/18-12/09/18    Authorization - Visit Number  10    Authorization - Number of Visits  30    PT Start Time  1026    PT Stop Time  1111    PT Time Calculation (min)  45 min    Activity Tolerance  Patient tolerated treatment well    Behavior During Therapy  [facility] for tasks assessed/performed       Past Medical History:  Diagnosis Date  . Carpal tunnel syndrome     Past Surgical History:  Procedure Laterality Date  . ANTERIOR CRUCIATE LIGAMENT REPAIR Left   . CARPAL TUNNEL RELEASE Right 04/01/2015   Procedure: Right Carpal Tunnel Release;  Surgeon: Eldred MangesMark C Yates, MD;  Location: [facility];  Service: Orthopedics;  Laterality: Right;  . CHOLECYSTECTOMY    . EXCISION METACARPAL MASS Right 04/01/2015   Procedure: Excision Right Long Finger Mass;  Surgeon: Eldred MangesMark C Yates, MD;  Location: [facility];  Service: Orthopedics;  Laterality: Right;    There were no vitals filed for this visit.  Subjective Assessment - 11/30/18 1027    Subjective  Pt reports her R hip/low back is sore from last session or moving a bed but denies pain.    Pertinent History  bil bunionectomy    Limitations  Walking    How long can you stand comfortably?  about 1 hour - makes knee hurt    Currently in Pain?  No/denies       [facility] - 11/30/18 0001      Manual Therapy   Manual Therapy  Joint  mobilization    Manual therapy comments  separate rest of treatment    Soft tissue mobilization  STM to gastroc, soleus, and around achilles tendon to relieve pain and address swelling      Ankle Exercises: Stretches   Soleus Stretch  3 reps;30 seconds    Slant Board Stretch  3 reps;30 seconds      Ankle Exercises: Seated   Heel Raises  Left   2x10 reps with 20# on L quad, eccentric lowering   Other Seated Ankle Exercises  arch doming; verbal and tactile cues for form   video for visual cues     Ankle Exercises: Standing   Heel Raises  Limitations    Heel Raises Limitations  Heel Raise variations: 2x 20 reps bil riase with eccentric lowering on Lt LE on 4" step; bil and alternating R/L soleus heel raises with bil knees bent, single UE support    Other Standing Ankle Exercises  L foot doming with RLE reverse ski lunges, 2x10 reps; L foot doming with RLE reaching target 12-18" away in abduction, 2x10 reps    Other Standing Ankle Exercises  arch doming, 2x10 reps barefoot for tactile input      Ankle Exercises:  Machines for Strengthening   Cybex Leg Press  bil calf raise, 40# with isometric hold, 3x45 sec         PT Education - 11/30/18 1028    Education Details  updated HEP, exercise technique    Person(s) Educated  Patient    Methods  Explanation;Demonstration    Comprehension  Verbalized understanding;Returned demonstration       PT Short Term Goals - 10/28/18 1219      PT SHORT TERM GOAL #1   Title  Patient will be independent with HEP, updated PRN, to improve fucntional strength of LE and tolerance to loading Lt achilles tendon.    Time  2    Period  Weeks    Status  New    Target Date  11/11/18      PT SHORT TERM GOAL #2   Title  Patient will have no pain with resistance testing via MMT for Rt ankle to indicate improved tolerance to muscle activation and load.     Time  3    Period  Weeks    Status  New    Target Date  11/18/18        PT Long Term Goals -  10/28/18 1221      PT LONG TERM GOAL #1   Title  Patient will make 10% improvement or greater in FOTO inidicating significant reduction in self reported limitations related to Lt ankle pain.     Time  6    Period  Weeks    Status  New    Target Date  12/09/18      PT LONG TERM GOAL #2   Title  Patient will improve Lt ankle AROM to be painfree and equal Rt ankle and fall within functional limits.    Time  6    Period  Weeks    Status  New      PT LONG TERM GOAL #3   Title  Patient will report being able to walk for 2 hours or more without requiring a break due to Lt ankle pain indicating improved activity tolerance and improving QOL.    Time  6    Period  Weeks    Status  New            Plan - 11/30/18 1127    Clinical Impression Statement  Continued with POC by progressing standing foot intrinsic strengthening and loading L calf to reduce pain and improve mobility. Pt continues to require visual and tactile cues for arch doming in sitting, demonstrates more success in standing but reports not being able to feel the muscles working.  Pt tolerates increased weighted exercises with sitting heel raises to engage soleus and standing calf raises with bent knee posture to engage soleus in standing. Pt limited with standing exercises in flexed knee posture secondary to L knee pain from meniscus injury. Pt with success in weighted calf raises with isometric hold on leg press machine. Pt denies pain throughout treatment session and with less noted trigger points with STM at EOS. Pt with some tenderness to palpation on Achilles tendon and throughout medial calf musculature. Added 3way calf raises for HEP to strengthen medial and lateral heads of gastric more specifically. Continue to progress as tolerable.    Personal Factors and Comorbidities  Comorbidity 1    Comorbidities  HTN    Examination-Activity Limitations  Stand;Locomotion Level    Stability/Clinical Decision Making   Stable/Uncomplicated    Rehab Potential  Good  PT Frequency  2x / week    PT Duration  6 weeks    PT Treatment/Interventions  ADLs/Self Care Home Management;Aquatic Therapy;Cryotherapy;Electrical Stimulation;Iontophoresis 4mg /ml Dexamethasone;Moist Heat;Functional mobility training;Therapeutic activities;Therapeutic exercise;Balance training;Neuromuscular re-education;Patient/family education;Manual techniques;Passive range of motion;Taping;Joint Manipulations;Dry needling    PT Next Visit Plan  Continue to perform standing exercises with intrinsic foot strength (SLS barefoot/in socks on foam +pallof press, reverse ski lunges, etc.) and isometric/eccentric calf exercises with weight. Continue cross friction massage to achilles and STM to gastroc/soleus as tolerated. Perform dry needling PRN.    PT Home Exercise Plan  Eval: eccentric lowering form heel raise; 5/29: isometric calf raises; 6/16: arch raise, towel scrunch; 6/22: clams with GTB, SL hip abd; 6/24: 3way calf raise    Consulted and Agree with Plan of Care  Patient       Patient will benefit from skilled therapeutic intervention in order to improve the following deficits and impairments:  Abnormal gait, Decreased activity tolerance, Decreased range of motion, Decreased strength, Difficulty walking, Hypomobility, Pain, Increased fascial restricitons, Impaired flexibility  Visit Diagnosis: 1. Pain in left ankle and joints of left foot   2. Stiffness of left ankle, not elsewhere classified   3. Other symptoms and signs involving the musculoskeletal system        Problem List Patient Active Problem List   Diagnosis Date Noted  . Partial tear of left Achilles tendon 08/25/2018  . Bunion of great toe of right foot 09/02/2017  . Ganglion cyst of finger of right hand 02/04/2017  . Acute pain of left knee 01/14/2017  . Right elbow pain 01/14/2017  . Pes anserinus bursitis 02/15/2014  . PRURITUS 10/15/2009  . FATIGUE 07/23/2009  .  SHOULDER PAIN, RIGHT 06/21/2009  . NECK PAIN 05/06/2009  . BACK PAIN 02/21/2009  . HYPERTENSION 10/02/2008  . KNEE PAIN, RIGHT 09/27/2008  . FOLLICULITIS 07/29/2008  . UNSPECIFIED VAGINITIS AND VULVOVAGINITIS 01/04/2008  . 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 OBESITY, UNSPECIFIED 11/11/2007  . ALLERGIC RHINITIS, SEASONAL 11/11/2007  . A C L SPRAIN-CHRONIC 06/23/2007  . TEAR A C L 06/23/2007    11:41 AM, 11/30/18 Domenick Bookbinderori Platon Arocho PT, DPT   [facility] [address] [place], [place], [address] Phone: [phone]   Fax:  [phone]  Name: Leah Phillips MRN: [id number] Date of Birth: [birth year]

## 2018-12-02 ENCOUNTER — Ambulatory Visit (HOSPITAL_COMMUNITY): Payer: PRIVATE HEALTH INSURANCE

## 2018-12-05 ENCOUNTER — Encounter (HOSPITAL_COMMUNITY): Payer: PRIVATE HEALTH INSURANCE | Admitting: Physical Therapy

## 2018-12-06 ENCOUNTER — Ambulatory Visit (HOSPITAL_COMMUNITY): Payer: PRIVATE HEALTH INSURANCE

## 2018-12-06 ENCOUNTER — Other Ambulatory Visit: Payer: Self-pay

## 2018-12-06 ENCOUNTER — Encounter (HOSPITAL_COMMUNITY): Payer: Self-pay

## 2018-12-06 DIAGNOSIS — M25572 Pain in left ankle and joints of left foot: Secondary | ICD-10-CM

## 2018-12-06 DIAGNOSIS — R29898 Other symptoms and signs involving the musculoskeletal system: Secondary | ICD-10-CM

## 2018-12-06 DIAGNOSIS — M25672 Stiffness of left ankle, not elsewhere classified: Secondary | ICD-10-CM

## 2018-12-06 NOTE — Therapy (Signed)
[name], [place], [address] Phone: [phone]   Fax:  [phone]  Physical Therapy Treatment  Patient Details  Name: Leah Phillips MRN: [id number] Date of Birth: [birth year] Referring Provider (PT): Marybelle Killings, MD   Encounter Date: 12/06/2018  PT End of Session - 12/06/18 1117    Visit Number  11    Number of Visits  12    Date for PT Re-Evaluation  12/09/18    Authorization Type  [employer] [place] (30 visits PT/OT combined - 0 used at eval, no auth required)    Authorization Time Period  10/28/18-12/09/18    Authorization - Visit Number  11    Authorization - Number of Visits  30    PT Start Time  1116    PT Stop Time  1156    PT Time Calculation (min)  40 min    Activity Tolerance  Patient tolerated treatment well    Behavior During Therapy  [facility] for tasks assessed/performed       Past Medical History:  Diagnosis Date  . Carpal tunnel syndrome     Past Surgical History:  Procedure Laterality Date  . ANTERIOR CRUCIATE LIGAMENT REPAIR Left   . CARPAL TUNNEL RELEASE Right 04/01/2015   Procedure: Right Carpal Tunnel Release;  Surgeon: Marybelle Killings, MD;  Location: [facility];  Service: Orthopedics;  Laterality: Right;  . CHOLECYSTECTOMY    . EXCISION METACARPAL MASS Right 04/01/2015   Procedure: Excision Right Long Finger Mass;  Surgeon: Marybelle Killings, MD;  Location: [facility];  Service: Orthopedics;  Laterality: Right;    There were no vitals filed for this visit.  Subjective Assessment - 12/06/18 1119    Subjective  Pt states that she had a rough day yesterday when helping mow a yard for a friend -- she thinks she got into some poison oak/ivy on her neck but her L ankle held up well. She said that Sunday it gave her a fit and it was swollen, but it's better today.    Pertinent History  bil bunionectomy    Limitations  Walking    How long can you stand comfortably?  about 1  hour - makes knee hurt    Currently in Pain?  Yes    Pain Score  2     Pain Location  Heel    Pain Orientation  Left    Pain Descriptors / Indicators  Aching    Pain Type  Chronic pain    Pain Onset  More than a month ago    Pain Frequency  Intermittent    Aggravating Factors   WB    Pain Relieving Factors  rest    Effect of Pain on Daily Activities  increase              [facility] Adult PT Treatment/Exercise - 12/06/18 0001      Manual Therapy   Manual Therapy  Soft tissue mobilization;Joint mobilization    Manual therapy comments  separate rest of treatment    Joint Mobilization  Grade III-IV CPAs to L3-5 for improved joint and nerve mobility    Soft tissue mobilization  STM after needling to gastroc, soleus, posterior tib, Achilles tendon and to lower lumbar paraspinals to reduce restrictions and L ankle/Achilles pain      Ankle Exercises: Stretches   Soleus Stretch  3 reps;30 seconds   on step   Press photographer  3 reps;30 seconds   on step     Ankle Exercises: Standing   Warrior III  bil heel raises with ankle inv with GTB x20 reps       Trigger Point Dry Needling - 12/06/18 0001    Consent Given?  Yes    Education Handout Provided  Previously provided    Muscles Treated Lower Quadrant  Gastrocnemius;Soleus;Posterior tibialis    Other Dry Needling  L in prone and supine    Posterior tibialis Response  Twitch response elicited;Palpable increased muscle length    Gastrocnemius Response  Twitch response elicited;Palpable increased muscle length    Soleus Response  Twitch response elicited;Palpable increased muscle length           PT Education - 12/06/18 1201    Education Details  will reassess next visit    Person(s) Educated  Patient    Methods  Explanation    Comprehension  Verbalized understanding       PT Short Term Goals - 10/28/18 1219      PT SHORT TERM GOAL #1   Title  Patient will be independent with HEP, updated PRN, to improve fucntional  strength of LE and tolerance to loading Lt achilles tendon.    Time  2    Period  Weeks    Status  New    Target Date  11/11/18      PT SHORT TERM GOAL #2   Title  Patient will have no pain with resistance testing via MMT for Rt ankle to indicate improved tolerance to muscle activation and load.     Time  3    Period  Weeks    Status  New    Target Date  11/18/18        PT Long Term Goals - 10/28/18 1221      PT LONG TERM GOAL #1   Title  Patient will make 10% improvement or greater in FOTO inidicating significant reduction in self reported limitations related to Lt ankle pain.     Time  6    Period  Weeks    Status  New    Target Date  12/09/18      PT LONG TERM GOAL #2   Title  Patient will improve Lt ankle AROM to be painfree and equal Rt ankle and fall within functional limits.    Time  6    Period  Weeks    Status  New      PT LONG TERM GOAL #3   Title  Patient will report being able to walk for 2 hours or more without requiring a break due to Lt ankle pain indicating improved activity tolerance and improving QOL.    Time  6    Period  Weeks    Status  New            Plan - 12/06/18 1201    Clinical Impression Statement  Pt continues to have restrictions throughout L gastroc soleus and reports of pain medial to the Achilles tendon; PT feels this is posterior tib so continued with dry needling this date and began needling posterior tib this date. Good twitch responses elicited throughout all and followed up with manual STM to calf and posterior tib tendon/mm belly. Pt also reporting chronic h/o LBP so PT initiated joint mobs and STM to L3-5 and surrounding paraspinals to address proximal nerve roots of foot/ankle nerve supply in order to reduce her overall pain. Ended session with calf  stretching and calf/post tib activation with heel raises. Pt due for reassessment next visit.    Personal Factors and Comorbidities  Comorbidity 1    Comorbidities  HTN     Examination-Activity Limitations  Stand;Locomotion Level    Stability/Clinical Decision Making  Stable/Uncomplicated    Rehab Potential  Good    PT Frequency  2x / week    PT Duration  6 weeks    PT Treatment/Interventions  ADLs/Self Care Home Management;Aquatic Therapy;Cryotherapy;Electrical Stimulation;Iontophoresis 4mg /ml Dexamethasone;Moist Heat;Functional mobility training;Therapeutic activities;Therapeutic exercise;Balance training;Neuromuscular re-education;Patient/family education;Manual techniques;Passive range of motion;Taping;Joint Manipulations;Dry needling    PT Next Visit Plan  reassessment; Continue to perform standing exercises with intrinsic foot strength (SLS barefoot/in socks on foam +pallof press, reverse ski lunges, etc.) and isometric/eccentric calf exercises with weight. Continue cross friction massage to achilles and STM to gastroc/soleus as tolerated. Perform dry needling PRN.    PT Home Exercise Plan  Eval: eccentric lowering form heel raise; 5/29: isometric calf raises; 6/16: arch raise, towel scrunch; 6/22: clams with GTB, SL hip abd; 6/24: 3way calf raise    Consulted and Agree with Plan of Care  Patient       Patient will benefit from skilled therapeutic intervention in order to improve the following deficits and impairments:  Abnormal gait, Decreased activity tolerance, Decreased range of motion, Decreased strength, Difficulty walking, Hypomobility, Pain, Increased fascial restricitons, Impaired flexibility  Visit Diagnosis: 1. Pain in left ankle and joints of left foot   2. Stiffness of left ankle, not elsewhere classified   3. Other symptoms and signs involving the musculoskeletal system        Problem List Patient Active Problem List   Diagnosis Date Noted  . Partial tear of left Achilles tendon 08/25/2018  . Bunion of great toe of right foot 09/02/2017  . Ganglion cyst of finger of right hand 02/04/2017  . Acute pain of left knee 01/14/2017  . Right  elbow pain 01/14/2017  . Pes anserinus bursitis 02/15/2014  . PRURITUS 10/15/2009  . FATIGUE 07/23/2009  . SHOULDER PAIN, RIGHT 06/21/2009  . NECK PAIN 05/06/2009  . BACK PAIN 02/21/2009  . HYPERTENSION 10/02/2008  . KNEE PAIN, RIGHT 09/27/2008  . FOLLICULITIS 07/29/2008  . UNSPECIFIED VAGINITIS AND VULVOVAGINITIS 01/04/2008  . 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 OBESITY, UNSPECIFIED 11/11/2007  . ALLERGIC RHINITIS, SEASONAL 11/11/2007  . A C L SPRAIN-CHRONIC 06/23/2007  . TEAR A C L 06/23/2007       Jac CanavanBrooke Powell PT, DPT   [facility] [address] [place], [place], [address] Phone: [phone]   Fax:  [phone]  Name: Leah Phillips MRN: [id number] Date of Birth: 06-15-1970

## 2018-12-07 ENCOUNTER — Encounter (HOSPITAL_COMMUNITY): Payer: PRIVATE HEALTH INSURANCE

## 2018-12-08 ENCOUNTER — Ambulatory Visit (HOSPITAL_COMMUNITY): Payer: PRIVATE HEALTH INSURANCE | Attending: Orthopaedic Surgery

## 2018-12-08 ENCOUNTER — Encounter (HOSPITAL_COMMUNITY): Payer: Self-pay

## 2018-12-08 ENCOUNTER — Other Ambulatory Visit: Payer: Self-pay

## 2018-12-08 DIAGNOSIS — M25572 Pain in left ankle and joints of left foot: Secondary | ICD-10-CM | POA: Insufficient documentation

## 2018-12-08 DIAGNOSIS — M25672 Stiffness of left ankle, not elsewhere classified: Secondary | ICD-10-CM | POA: Diagnosis present

## 2018-12-08 DIAGNOSIS — R29898 Other symptoms and signs involving the musculoskeletal system: Secondary | ICD-10-CM | POA: Diagnosis present

## 2018-12-08 NOTE — Therapy (Signed)
[place], [place], [address] Phone: [phone]   Fax:  [phone]  Physical Therapy Treatment &  Discharge Summary  Patient Details  Name: Leah Phillips MRN: [id number] Date of Birth: 1970-07-30 Referring Provider (PT): Marybelle Killings, MD   Encounter Date: 12/08/2018   PHYSICAL THERAPY DISCHARGE SUMMARY  Visits from Start of Care: 12  Current functional level related to goals / functional outcomes: Re-assessment performed today and patient has met all short and long term goals. She has improved functional LE strength for all limited groups and is pain free with all resistance testing to Lt ankle. Her Lt ankle ROM has improved and she now has 10 degrees of active dorsiflexion on lt compared to 5 at evaluation. Patient has been educated on exercises throughout therapy duration and now has advanced HEP to continue with independently. She reports improved walking ability to ambulate 2-3 hours at a time compared to 1 hour at evaluation. Patient's biggest complaint currently is her lower back pain and she plans to follow up with her MD for a referral to treat this. Patient will be discharged from this episode of care as she has met all goals and is confident she can maintain progress on her own.    Remaining deficits: See below details.   Education / Equipment: Educated on progress towards goals and changes in objective measures since evaluation. Encouraged to cal lif she has any questions regarding her HEP as she continues independently.  Plan: Patient agrees to discharge.  Patient goals were met. Patient is being discharged due to meeting the stated rehab goals.  ?????      PT End of Session - 12/08/18 1354    Visit Number  12    Number of Visits  12    Date for PT Re-Evaluation  12/09/18    Authorization Type  [employer] [place] (30 visits PT/OT combined - 0 used at eval, no auth required)    Authorization Time  Period  10/28/18-12/09/18    Authorization - Visit Number  12    Authorization - Number of Visits  30    PT Start Time  1330   pt late   PT Stop Time  1355    PT Time Calculation (min)  25 min    Activity Tolerance  Patient tolerated treatment well    Behavior During Therapy  [facility]FL for tasks assessed/performed       Past Medical History:  Diagnosis Date  . Carpal tunnel syndrome     Past Surgical History:  Procedure Laterality Date  . ANTERIOR CRUCIATE LIGAMENT REPAIR Left   . CARPAL TUNNEL RELEASE Right 04/01/2015   Procedure: Right Carpal Tunnel Release;  Surgeon: Marybelle Killings, MD;  Location: [facility];  Service: Orthopedics;  Laterality: Right;  . CHOLECYSTECTOMY    . EXCISION METACARPAL MASS Right 04/01/2015   Procedure: Excision Right Long Finger Mass;  Surgeon: Marybelle Killings, MD;  Location: [facility];  Service: Orthopedics;  Laterality: Right;    There were no vitals filed for this visit.  Subjective Assessment - 12/08/18 1329    Subjective  Patient reports she is doing ok today but is tired. [name] sore in his back and hips from working on her car today. She states her foot still is sore but only about 2/10. She does feel she can walk longer than she used to prior to starting therapy. She now feels most  limited by her back.    Pertinent History  bil bunionectomy    Limitations  Walking    How long can you stand comfortably?  about 1 hour - makes knee hurt    Currently in Pain?  Yes    Pain Score  2     Pain Location  Heel    Pain Orientation  Left    Pain Descriptors / Indicators  Aching    Pain Type  Chronic pain    Pain Onset  More than a month ago    Pain Frequency  Intermittent         [facility] PT Assessment - 12/08/18 0001      Assessment   Medical Diagnosis  Lt Achilles Tendon Pain    Referring Provider (PT)  Marybelle Killings, MD    Onset Date/Surgical Date  --   this past Jan-Feb started noticing it   Hand Dominance  Right     Next MD Visit  hasn't made a follow up yet    Prior Therapy  Yes before for Lt knee      Precautions   Precautions  None      Restrictions   Weight Bearing Restrictions  No      Home Environment   Living Environment  Private residence    Living Arrangements  Spouse/significant other   boyfriend   Type of [place] to enter    Entrance Stairs-Number of Steps  3    Entrance Stairs-Rails  Can reach both   at front has rails, car port does not   Home Layout  Two level    Alternate Level Stairs-Number of Steps  16    Alternate Level Stairs-Rails  --   on railing and the wall   Home Equipment  None      Prior Function   Level of Independence  Independent    Vocation  Full time employment    Vocation Requirements  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 [name] currently building a wall in her yard to house her solar lights and is creating a loft for a twin bed.       Cognition   Overall Cognitive Status  Within Functional Limits for tasks assessed      Observation/Other Assessments   Focus on Therapeutic Outcomes (FOTO)   --   take next visit     AROM   Right Ankle Dorsiflexion  12    Right Ankle Plantar Flexion  68    Right Ankle Inversion  45    Right Ankle Eversion  32    Left Ankle Dorsiflexion  10    Left Ankle Plantar Flexion  60    Left Ankle Inversion  46    Left Ankle Eversion  40      Strength   Right Hip Flexion  5/5    Right Hip Extension  4+/5    Right Hip ABduction  5/5    Left Hip Flexion  5/5    Left Hip Extension  4+/5    Left Hip ABduction  5/5    Right Ankle Dorsiflexion  5/5    Right Ankle Plantar Flexion  5/5    Right Ankle Inversion  5/5    Right Ankle Eversion  5/5    Left Ankle Dorsiflexion  5/5  Left Ankle Plantar Flexion  5/5    Left Ankle Inversion  5/5    Left Ankle Eversion  5/5      Palpation   Palpation comment  tenderness to  palpation of achilles tendon on Lt LE ~ 2 inches above calcaneous insertion. Tendon is thickened compared to Rt anlke.       Special Tests    Special Tests  --    Ankle/Foot Special Tests   --      Thompson's Test   Findings  --    Side  --      Tinel's test - Post Tibialis    Findings  --    Side  --      Provocative Tinel's test    Findings  --    Side  --       PT Education - 12/08/18 1358    Education Details  Educated on progress towards goals and changes in objective measures since evaluation. Encouraged to cal lif she has any questions regarding her HEP as she continues independently.    Person(s) Educated  Patient    Methods  Explanation    Comprehension  Verbalized understanding       PT Short Term Goals - 12/08/18 1348      PT SHORT TERM GOAL #1   Title  Patient will be independent with HEP, updated PRN, to improve fucntional strength of LE and tolerance to loading Lt achilles tendon.    Time  2    Period  Weeks    Status  Achieved    Target Date  11/11/18      PT SHORT TERM GOAL #2   Title  Patient will have no pain with resistance testing via MMT for Rt ankle to indicate improved tolerance to muscle activation and load.     Time  3    Period  Weeks    Status  Achieved    Target Date  11/18/18        PT Long Term Goals - 12/08/18 1348      PT LONG TERM GOAL #1   Title  Patient will make 10% improvement or greater in FOTO inidicating significant reduction in self reported limitations related to Lt ankle pain.     Time  6    Period  Weeks    Status  Achieved      PT LONG TERM GOAL #2   Title  Patient will improve Lt ankle AROM to be painfree and equal Rt ankle and fall within functional limits.    Time  6    Period  Weeks    Status  Achieved      PT LONG TERM GOAL #3   Title  Patient will report being able to walk for 2 hours or more without requiring a break due to Lt ankle pain indicating improved activity tolerance and improving QOL.    Time   6    Period  Weeks    Status  Achieved        Plan - 12/08/18 1355    Clinical Impression Statement  Re-assessment performed today and patient has met all short and long term goals. She has improved functional LE strength for all limited groups and is pain free with all resistance testing to Lt ankle. Her Lt ankle ROM has improved and she now has 10 degrees of active dorsiflexion on lt compared to 5 at evaluation. Patient has been educated on exercises throughout therapy  duration and now has advanced HEP to continue with independently. She reports improved walking ability to ambulate 2-3 hours at a time compared to 1 hour at evaluation. Patient's biggest complaint currently is her lower back pain and she plans to follow up with her MD for a referral to treat this. Patient will be discharged from this episode of care as she has met all goals and is confident she can maintain progress on her own.    Personal Factors and Comorbidities  Comorbidity 1    Comorbidities  HTN    Examination-Activity Limitations  Stand;Locomotion Level    Stability/Clinical Decision Making  Stable/Uncomplicated    Rehab Potential  Good    PT Frequency  2x / week    PT Duration  6 weeks    PT Treatment/Interventions  ADLs/Self Care Home Management;Aquatic Therapy;Cryotherapy;Electrical Stimulation;Iontophoresis 36m/ml Dexamethasone;Moist Heat;Functional mobility training;Therapeutic activities;Therapeutic exercise;Balance training;Neuromuscular re-education;Patient/family education;Manual techniques;Passive range of motion;Taping;Joint Manipulations;Dry needling    PT Next Visit Plan  discharge to [facility] eccentric lowering form heel raise; 5/29: isometric calf raises; 6/16: arch raise, towel scrunch; 6/22: clams with GTB, SL hip abd; 6/24: 3way calf raise    Consulted and Agree with Plan of Care  Patient       Patient will benefit from skilled therapeutic intervention in order to improve the  following deficits and impairments:  Abnormal gait, Decreased activity tolerance, Decreased range of motion, Decreased strength, Difficulty walking, Hypomobility, Pain, Increased fascial restricitons, Impaired flexibility  Visit Diagnosis: 1. Pain in left ankle and joints of left foot   2. Stiffness of left ankle, not elsewhere classified   3. Other symptoms and signs involving the musculoskeletal system        Problem List Patient Active Problem List   Diagnosis Date Noted  . Partial tear of left Achilles tendon 08/25/2018  . Bunion of great toe of right foot 09/02/2017  . Ganglion cyst of finger of right hand 02/04/2017  . Acute pain of left knee 01/14/2017  . Right elbow pain 01/14/2017  . Pes anserinus bursitis 02/15/2014  . PRURITUS 10/15/2009  . FATIGUE 07/23/2009  . SHOULDER PAIN, RIGHT 06/21/2009  . NECK PAIN 05/06/2009  . BACK PAIN 02/21/2009  . HYPERTENSION 10/02/2008  . KNEE PAIN, RIGHT 09/27/2008  . FOLLICULITIS 027/61/4709 . UNSPECIFIED VAGINITIS AND VULVOVAGINITIS 01/04/2008  . 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 OBESITY, UNSPECIFIED 11/11/2007  . ALLERGIC RHINITIS, SEASONAL 11/11/2007  . A C L SPRAIN-CHRONIC 06/23/2007  . TEAR A C L 06/23/2007    RKipp Brood PT, DPT, [facility] Therapist with [facility] 12/08/2018 2:04 PM    [address]SBro[address]port [place] [address]Phone: [phone]  Fax:  [phone] Name: TLINDEN MIKESMRN: [id number] of Birth: [birth year]

## 2019-01-18 ENCOUNTER — Other Ambulatory Visit (INDEPENDENT_AMBULATORY_CARE_PROVIDER_SITE_OTHER): Payer: Self-pay | Admitting: Orthopaedic Surgery

## 2019-01-18 NOTE — Telephone Encounter (Signed)
Please advise 

## 2019-02-15 ENCOUNTER — Ambulatory Visit: Payer: PRIVATE HEALTH INSURANCE | Admitting: Allergy & Immunology

## 2019-02-15 ENCOUNTER — Encounter: Payer: Self-pay | Admitting: Allergy & Immunology

## 2019-02-15 ENCOUNTER — Other Ambulatory Visit: Payer: Self-pay

## 2019-02-15 VITALS — BP 112/76 | HR 76 | Temp 98.3°F | Resp 18 | Ht 62.0 in | Wt 201.6 lb

## 2019-02-15 DIAGNOSIS — J302 Other seasonal allergic rhinitis: Secondary | ICD-10-CM | POA: Diagnosis not present

## 2019-02-15 DIAGNOSIS — J3089 Other allergic rhinitis: Secondary | ICD-10-CM | POA: Diagnosis not present

## 2019-02-15 DIAGNOSIS — L500 Allergic urticaria: Secondary | ICD-10-CM

## 2019-02-15 MED ORDER — FAMOTIDINE 20 MG PO TABS: 20.0000 mg | ORAL_TABLET | Freq: Every day | ORAL | 2 refills | Status: DC

## 2019-02-15 MED ORDER — MONTELUKAST SODIUM 10 MG PO TABS: 10.0000 mg | ORAL_TABLET | Freq: Every day | ORAL | 5 refills | Status: DC

## 2019-02-15 MED ORDER — FEXOFENADINE HCL 180 MG PO TABS: 180.0000 mg | ORAL_TABLET | Freq: Every day | ORAL | 2 refills | Status: DC

## 2019-02-15 NOTE — Patient Instructions (Addendum)
1. Seasonal and perennial allergic rhinitis - Testing today showed: grasses, ragweed, weeds, trees, indoor molds, outdoor molds, dust mites, cat and cockroach - Copy of test results provided.  - Avoidance measures provided. - Start taking: Allegra (fexofenadine) 180mg  table once AT NIGHT and Singulair (montelukast) 10mg  daily AT NIGHT - You can use an extra dose of the antihistamine, if needed, for breakthrough symptoms.  - Consider nasal saline rinses 1-2 times daily to remove allergens from the nasal cavities as well as help with mucous clearance (this is especially helpful to do before the nasal sprays are given) - Consider allergy shots as a means of long-term control. - Allergy shots "re-train" and "reset" the immune system to ignore environmental allergens and decrease the resulting immune response to those allergens (sneezing, itchy watery eyes, runny nose, nasal congestion, etc).    - Allergy shots improve symptoms in 75-85% of patients.  - We can discuss more at the next appointment if the medications are not working for you.  2. Allergic urticaria - Your history does not have any "red flags" such as fevers, joint pains, or permanent skin changes that would be concerning for a more serious cause of hives.  - Testing today reveals likely triggers of your hives.  - I think we can defer on lab workup at this time, unless the hives do not get any better.  - Chronic hives are often times a self limited process and will "burn themselves out" over 6-12 months, although this is not always the case.  - In the meantime, start suppressive dosing of antihistamines (we are not doing higher doses since you have):   - Evening: Allegra (fexofenadine) 180 mg table once AT NIGHT + Pepcid (famotidine) 20 mg AT NIGHT + Singulair (montelukast) 10 mg AT NIGHT - Allergy shots might be the best way to address this since you clearly have allergic triggers (information on allergy shots provided).   3. Return in  about 3 months (around 05/17/2019). This can be an in-person, a virtual Webex or a telephone follow up visit.   Please inform us of any Emergency Department visits, hospitalizations, or changes in symptoms. Call us before going to the ED for breathing or allergy symptoms since we might be able to fit you in for a sick visit. Feel free to contact us anytime with any questions, problems, or concerns.  It was a pleasure to meet you today!  Websites that have reliable patient information: 1. American Academy of Asthma, Allergy, and Immunology: www.aaaai.org 2. Food Allergy Research and Education (FARE): foodallergy.org 3. Mothers of Asthmatics: http://www.asthmacommunitynetwork.org 4. American College of Allergy, Asthma, and Immunology: www.acaai.org  "Like" [place] on Facebook and Instagram for our latest updates!      Make sure you are registered to vote! If you have moved or changed any of your contact information, you will need to get this updated before voting!  In some cases, you MAY be able to register to vote online: [email or web address]    Voter ID laws are NOT going into effect for the General Election in November 2020! DO NOT let this stop you from exercising your right to vote!   Absentee voting is the SAFEST way to vote during the coronavirus pandemic!   Download and print an absentee ballot request form at [email or web address] or you can scan the QR code below with your smart phone:      More information on absentee ballots can be found here: [email or web address]   Reducing Pollen Exposure  The  American Academy of Allergy, Asthma and Immunology suggests the following steps to reduce your exposure to pollen during allergy seasons.    1. Do not hang sheets or clothing out to dry; pollen may collect on these items. 2. Do not mow lawns or spend time around freshly cut grass; mowing stirs up pollen. 3. Keep windows closed at  night.  Keep car windows closed while driving. 4. Minimize morning activities outdoors, a time when pollen counts are usually at their highest. 5. Stay indoors as much as possible when pollen counts or humidity is high and on windy days when pollen tends to remain in the air longer. 6. Use air conditioning when possible.  Many air conditioners have filters that trap the pollen spores. 7. Use a HEPA room air filter to remove pollen form the indoor air you breathe.  Control of Mold Allergen   Mold and fungi can grow on a variety of surfaces provided certain temperature and moisture conditions exist.  Outdoor molds grow on plants, decaying vegetation and soil.  The major outdoor mold, Alternaria and Cladosporium, are found in very high numbers during hot and dry conditions.  Generally, a late Summer - Fall peak is seen for common outdoor fungal spores.  Rain will temporarily lower outdoor mold spore count, but counts rise rapidly when the rainy period ends.  The most important indoor molds are Aspergillus and Penicillium.  Dark, humid and poorly ventilated basements are ideal sites for mold growth.  The next most common sites of mold growth are the bathroom and the kitchen.  Outdoor (Seasonal) Mold Control   1. Use air conditioning and keep windows closed 2. Avoid exposure to decaying vegetation. 3. Avoid leaf raking. 4. Avoid grain handling. 5. Consider wearing a face mask if working in moldy areas.   Indoor (Perennial) Mold Control   1. Maintain humidity below 50%. 2. Clean washable surfaces with 5% bleach solution. 3. Remove sources e.g. contaminated carpets.     Control of House Dust Mite Allergen    House dust mites play a major role in allergic asthma and rhinitis.  They occur in environments with high humidity wherever human skin, the food for dust mites is found. High levels have been detected in dust obtained from mattresses, pillows, carpets, upholstered furniture, bed covers,  clothes and soft toys.  The principal allergen of the house dust mite is found in its feces.  A gram of dust may contain 1,000 mites and 250,000 fecal particles.  Mite antigen is easily measured in the air during house cleaning activities.    1. Encase mattresses, including the box spring, and pillow, in an air tight cover.  Seal the zipper end of the encased mattresses with wide adhesive tape. 2. Wash the bedding in water of 130 degrees Farenheit weekly.  Avoid cotton comforters/quilts and flannel bedding: the most ideal bed covering is the dacron comforter. 3. Remove all upholstered furniture from the bedroom. 4. Remove carpets, carpet padding, rugs, and non-washable window drapes from the bedroom.  Wash drapes weekly or use plastic window coverings. 5. Remove all non-washable stuffed toys from the bedroom.  Wash stuffed toys weekly. 6. Have the room cleaned frequently with a vacuum cleaner and a damp dust-mop.  The patient should not be in a room which is being cleaned and should wait 1 hour after cleaning before going into the room. 7. Close and seal all heating outlets in the bedroom.  Otherwise, the room will become filled with dust-laden air.  An electric heater can be used to heat the room. 8. Reduce indoor humidity to less than 50%.  Do not use a humidifier.  Control of Dog or Cat Allergen  Avoidance is the best way to manage a dog or cat allergy. If you have a dog or cat and are allergic to dog or cats, consider removing the dog or cat from the home. If you have a dog or cat but don't want to find it a new home, or if your family wants a pet even though someone in the household is allergic, here are some strategies that may help keep symptoms at bay:  1. Keep the pet out of your bedroom and restrict it to only a few rooms. Be advised that keeping the dog or cat in only one room will not limit the allergens to that room. 2. Don't pet, hug or kiss the dog or cat; if you do, wash your hands  with soap and water. 3. High-efficiency particulate air (HEPA) cleaners run continuously in a bedroom or living room can reduce allergen levels over time. 4. Regular use of a high-efficiency vacuum cleaner or a central vacuum can reduce allergen levels. 5. Giving your dog or cat a bath at least once a week can reduce airborne allergen.  Allergy Shots   Allergies are the result of a chain reaction that starts in the immune system. Your immune system controls how your body defends itself. For instance, if you have an allergy to pollen, your immune system identifies pollen as an invader or allergen. Your immune system overreacts by producing antibodies called Immunoglobulin E (IgE). These antibodies travel to cells that release chemicals, causing an allergic reaction.  The concept behind allergy immunotherapy, whether it is received in the form of shots or tablets, is that the immune system can be desensitized to specific allergens that trigger allergy symptoms. Although it requires time and patience, the payback can be long-term relief.  How Do Allergy Shots Work?  Allergy shots work much like a vaccine. Your body responds to injected amounts of a particular allergen given in increasing doses, eventually developing a resistance and tolerance to it. Allergy shots can lead to decreased, minimal or no allergy symptoms.  There generally are two phases: build-up and maintenance. Build-up often ranges from three to six months and involves receiving injections with increasing amounts of the allergens. The shots are typically given once or twice a week, though more rapid build-up schedules are sometimes used.  The maintenance phase begins when the most effective dose is reached. This dose is different for each person, depending on how allergic you are and your response to the build-up injections. Once the maintenance dose is reached, there are longer periods between injections, typically two to four weeks.   Occasionally doctors give cortisone-type shots that can temporarily reduce allergy symptoms. These types of shots are different and should not be confused with allergy immunotherapy shots.  Who Can Be Treated with Allergy Shots?  Allergy shots may be a good treatment approach for people with allergic rhinitis (hay fever), allergic asthma, conjunctivitis (eye allergy) or stinging insect allergy.   Before deciding to begin allergy shots, you should consider:  . The length of allergy season and the severity of your symptoms . Whether medications and/or changes to your environment can control your symptoms . Your desire to avoid long-term medication use . Time: allergy immunotherapy requires a major time commitment . Cost: may vary depending on your insurance coverage  Allergy  shots for children age 24 and older are effective and often well tolerated. They might prevent the onset of new allergen sensitivities or the progression to asthma.  Allergy shots are not started on patients who are pregnant but can be continued on patients who become pregnant while receiving them. In some patients with other medical conditions or who take certain common medications, allergy shots may be of risk. It is important to mention other medications you talk to your allergist.   When Will I Feel Better?  Some may experience decreased allergy symptoms during the build-up phase. For others, it may take as long as 12 months on the maintenance dose. If there is no improvement after a year of maintenance, your allergist will discuss other treatment options with you.  If you aren't responding to allergy shots, it may be because there is not enough dose of the allergen in your vaccine or there are missing allergens that were not identified during your allergy testing. Other reasons could be that there are high levels of the allergen in your environment or major exposure to non-allergic triggers like tobacco smoke.  What  Is the Length of Treatment?  Once the maintenance dose is reached, allergy shots are generally continued for three to five years. The decision to stop should be discussed with your allergist at that time. Some people may experience a permanent reduction of allergy symptoms. Others may relapse and a longer course of allergy shots can be considered.  What Are the Possible Reactions?  The two types of adverse reactions that can occur with allergy shots are local and systemic. Common local reactions include very mild redness and swelling at the injection site, which can happen immediately or several hours after. A systemic reaction, which is less common, affects the entire body or a particular body system. They are usually mild and typically respond quickly to medications. Signs include increased allergy symptoms such as sneezing, a stuffy nose or hives.  Rarely, a serious systemic reaction called anaphylaxis can develop. Symptoms include swelling in the throat, wheezing, a feeling of tightness in the chest, nausea or dizziness. Most serious systemic reactions develop within 30 minutes of allergy shots. This is why it is strongly recommended you wait in your doctor's office for 30 minutes after your injections. Your allergist is trained to watch for reactions, and his or her staff is trained and equipped with the proper medications to identify and treat them.  Who Should Administer Allergy Shots?  The preferred location for receiving shots is your prescribing allergist's office. Injections can sometimes be given at another facility where the physician and staff are trained to recognize and treat reactions, and have received instructions by your prescribing allergist.

## 2019-02-15 NOTE — Progress Notes (Signed)
NEW PATIENT  Date of Service/Encounter:  02/15/19  Referring provider: Kirstie Peri, MD   Assessment:   Seasonal and perennial allergic rhinitis (grasses, ragweed, weeds, trees, indoor molds, outdoor molds, dust mites, cat and cockroach)  Allergic urticaria  Plan/Recommendations:   1. Seasonal and perennial allergic rhinitis - Testing today showed: grasses, ragweed, weeds, trees, indoor molds, outdoor molds, dust mites, cat and cockroach - Copy of test results provided.  - Avoidance measures provided. - Start taking: Allegra (fexofenadine) 180mg  table once AT NIGHT and Singulair (montelukast) 10mg  daily AT NIGHT - You can use an extra dose of the antihistamine, if needed, for breakthrough symptoms.  - Consider nasal saline rinses 1-2 times daily to remove allergens from the nasal cavities as well as help with mucous clearance (this is especially helpful to do before the nasal sprays are given) - Consider allergy shots as a means of long-term control. - Allergy shots "re-train" and "reset" the immune system to ignore environmental allergens and decrease the resulting immune response to those allergens (sneezing, itchy watery eyes, runny nose, nasal congestion, etc).    - Allergy shots improve symptoms in 75-85% of patients.  - We can discuss more at the next appointment if the medications are not working for you.  2. Allergic urticaria - Your history does not have any "red flags" such as fevers, joint pains, or permanent skin changes that would be concerning for a more serious cause of hives.  - Testing today reveals likely triggers of your hives.  - I think we can defer on lab workup at this time, unless the hives do not get any better.  - Chronic hives are often times a self limited process and will "burn themselves out" over 6-12 months, although this is not always the case.  - In the meantime, start suppressive dosing of antihistamines (we are not doing higher doses since you  have):   - Evening: Allegra (fexofenadine) 180 mg table once AT NIGHT + Pepcid (famotidine) 20 mg AT NIGHT + Singulair (montelukast) 10 mg AT NIGHT - Allergy shots might be the best way to address this since you clearly have allergic triggers (information on allergy shots provided).   3. Return in about 3 months (around 05/17/2019). This can be an in-person, a virtual Webex or a telephone follow up visit.  Subjective:   Leah Phillips is a 48 y.o. female presenting today for evaluation of  Chief Complaint  Patient presents with  . Urticaria  . Nasal Congestion  . Burning Eyes  . Pruritus    Leah Phillips has a history of the following: Patient Active Problem List   Diagnosis Date Noted  . Seasonal and perennial allergic rhinitis 02/15/2019  . Allergic urticaria 02/15/2019  . Partial tear of left Achilles tendon 08/25/2018  . Bunion of great toe of right foot 09/02/2017  . Ganglion cyst of finger of right hand 02/04/2017  . Acute pain of left knee 01/14/2017  . Right elbow pain 01/14/2017  . Pes anserinus bursitis 02/15/2014  . PRURITUS 10/15/2009  . FATIGUE 07/23/2009  . SHOULDER PAIN, RIGHT 06/21/2009  . NECK PAIN 05/06/2009  . BACK PAIN 02/21/2009  . HYPERTENSION 10/02/2008  . KNEE PAIN, RIGHT 09/27/2008  . FOLLICULITIS 07/29/2008  . UNSPECIFIED VAGINITIS AND VULVOVAGINITIS 01/04/2008  . 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 OBESITY, UNSPECIFIED 11/11/2007  . ALLERGIC RHINITIS, SEASONAL 11/11/2007  . A C L SPRAIN-CHRONIC 06/23/2007  . TEAR A C L 06/23/2007    History  obtained from: chart review and patient.  Leah Phillips was referred by Kirstie PeriShah, Ashish, MD.     Leah Phillips is a 48 y.o. female presenting for an evaluation of urticaria.  She reports that she started having hives at the beginning of last year or so. She did have hives in the past but never to the same extent. She thinks that the last time that it happened was in 2002. She never had problems with it  before that time. It is only a problem when she leaves home and goes out. In particular, she has problems when she is at her client's home for 8 hours at a time.  Sometimes they are over her entire body. She does have a lot of pictures today to show me.   They resolve within a few hours. They never leave permanent skin changes. She does not have fevers or joint pain with these. Symptoms are isolated to the skin only. She tolerates all of the major food allergens without adverse events. She does not think that her urticaria are related to food exposure at all, as the time course is more consistent with her exposure to her client.   She does not do nasal sprays because they affect her vertigo. She does have ear popping when she swallows. She has been on antihistamines for 7 days.   Allergic Rhinitis Symptom History: She has been tested for environmental allergies in the distant past. She thinks that it was Dr. Willa RoughHicks. She does not use nose sprays since they seem to mess up her vertigo. She has seen ENT in the past and was told that she needs tubes in her ears. Every time that she swallows, she has crackling in her ears. She has been put on nasal sprays but it causes severe dizziness.       She does have some spinning sensation with neck pain when she looks a particular way. She has never seen a neurologist but she has bene told that she needs to do so. She has lost over 50 pounds.   Otherwise, there is no history of other atopic diseases, including asthma, drug allergies, stinging insect allergies, eczema or contact dermatitis. There is no significant infectious history. Vaccinations are up to date.    Past Medical History: Patient Active Problem List   Diagnosis Date Noted  . Seasonal and perennial allergic rhinitis 02/15/2019  . Allergic urticaria 02/15/2019  . Partial tear of left Achilles tendon 08/25/2018  . Bunion of great toe of right foot 09/02/2017  . Ganglion cyst of finger of right hand  02/04/2017  . Acute pain of left knee 01/14/2017  . Right elbow pain 01/14/2017  . Pes anserinus bursitis 02/15/2014  . PRURITUS 10/15/2009  . FATIGUE 07/23/2009  . SHOULDER PAIN, RIGHT 06/21/2009  . NECK PAIN 05/06/2009  . BACK PAIN 02/21/2009  . HYPERTENSION 10/02/2008  . KNEE PAIN, RIGHT 09/27/2008  . FOLLICULITIS 07/29/2008  . UNSPECIFIED VAGINITIS AND VULVOVAGINITIS 01/04/2008  . 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 OBESITY, UNSPECIFIED 11/11/2007  . ALLERGIC RHINITIS, SEASONAL 11/11/2007  . A C L SPRAIN-CHRONIC 06/23/2007  . TEAR A C L 06/23/2007    Medication List:  Allergies as of 02/15/2019      Reactions   Orange Fruit [citrus] Anaphylaxis   Amoxicillin    Pecan Nut (diagnostic) Other (See Comments)   Nuts- tested   Penicillins Hives   Tomato Other (See Comments)   Specific- German Johnson tomato / tingling tongue  Medication List       Accurate as of February 15, 2019  1:35 PM. If you have any questions, ask your nurse or doctor.        STOP taking these medications   azelastine 0.1 % nasal spray Commonly known as: ASTELIN Stopped by: Alfonse SpruceJoel Louis Laakea Pereira, MD   ciprofloxacin 500 MG tablet Commonly known as: CIPRO Stopped by: Alfonse SpruceJoel Louis Yaniv Lage, MD   Diclofenac Sodium 2 % Soln Stopped by: Alfonse SpruceJoel Louis Rudi Bunyard, MD   folic acid 0.5 MG tablet Commonly known as: FOLVITE Stopped by: Alfonse SpruceJoel Louis Jaylea Plourde, MD   HYDROcodone-acetaminophen 5-325 MG tablet Commonly known as: NORCO/VICODIN Stopped by: Alfonse SpruceJoel Louis Rip Hawes, MD   HYDROcodone-acetaminophen 7.5-325 MG tablet Commonly known as: NORCO Stopped by: Alfonse SpruceJoel Louis Taraji Mungo, MD   oxyCODONE-acetaminophen 5-325 MG tablet Commonly known as: PERCOCET/ROXICET Stopped by: Alfonse SpruceJoel Louis Patrice Moates, MD   tizanidine 2 MG capsule Commonly known as: Zanaflex Stopped by: Alfonse SpruceJoel Louis Shametra Cumberland, MD   traMADol 50 MG tablet Commonly known as: ULTRAM Stopped by: Alfonse SpruceJoel Louis Randall Colden, MD     TAKE  these medications   atenolol 50 MG tablet Commonly known as: TENORMIN Take 25 mg by mouth daily.   cyclobenzaprine 10 MG tablet Commonly known as: FLEXERIL Take 1 tablet (10 mg total) by mouth at bedtime.   famotidine 20 MG tablet Commonly known as: Pepcid Take 1 tablet (20 mg total) by mouth at bedtime. Started by: Alfonse SpruceJoel Louis Cala Kruckenberg, MD   fexofenadine 180 MG tablet Commonly known as: ALLEGRA Take 1 tablet (180 mg total) by mouth daily. Started by: Alfonse SpruceJoel Louis Bora Bost, MD   hydrochlorothiazide 12.5 MG tablet Commonly known as: HYDRODIURIL   hydrochlorothiazide 12.5 MG capsule Commonly known as: MICROZIDE Take 12.5 mg by mouth daily.   ibuprofen 800 MG tablet Commonly known as: ADVIL TAKE 1 TABLET BY MOUTH TWICE A DAY WITH MEALS   levocetirizine 5 MG tablet Commonly known as: XYZAL Take 5 mg by mouth every evening.   meloxicam 15 MG tablet Commonly known as: MOBIC TAKE ONE TABLET BY MOUTH WITH FOOD DAILY.   montelukast 10 MG tablet Commonly known as: SINGULAIR Take 1 tablet (10 mg total) by mouth at bedtime. Started by: Alfonse SpruceJoel Louis Alaney Witter, MD   Multi-Vitamins Tabs Take by mouth.   phentermine 37.5 MG tablet Commonly known as: ADIPEX-P Take 37.5 mg by mouth daily.   potassium chloride 10 MEQ tablet Commonly known as: K-DUR       Birth History: non-contributory  Developmental History: non-contributory  Past Surgical History: Past Surgical History:  Procedure Laterality Date  . ANTERIOR CRUCIATE LIGAMENT REPAIR Left   . CARPAL TUNNEL RELEASE Right 04/01/2015   Procedure: Right Carpal Tunnel Release;  Surgeon: Eldred MangesMark C Yates, MD;  Location: [facility];  Service: Orthopedics;  Laterality: Right;  . CHOLECYSTECTOMY    . EXCISION METACARPAL MASS Right 04/01/2015   Procedure: Excision Right Long Finger Mass;  Surgeon: Eldred MangesMark C Yates, MD;  Location: [facility];  Service: Orthopedics;  Laterality: Right;     Family History:  Family History  Problem Relation Age of Onset  . Allergic rhinitis Mother   . Asthma Sister      Social History: Leah Phillips lives at home with her husband.  She lives in a house that was built in 1975.  There is laminate flooring in the main living areas and carpeting in the bedroom.  She has electric heating and central cooling.  There is a poodle in the home.  She does have dust mite on her bedding.  There is no tobacco exposure.  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   Review of Systems  Constitutional: Negative.  Negative for chills, fever, malaise/fatigue and weight loss.  HENT: Negative.  Negative for congestion, ear discharge, ear pain, nosebleeds, sinus pain and sore throat.   Eyes: Negative for pain, discharge and redness.  Respiratory: Negative for cough, sputum production, shortness of breath and wheezing.   Cardiovascular: Negative.  Negative for chest pain and palpitations.  Gastrointestinal: Negative for abdominal pain, constipation, diarrhea, heartburn, nausea and vomiting.  Skin: Positive for itching and rash.  Neurological: Negative for dizziness and headaches.  Endo/Heme/Allergies: Negative for environmental allergies. Does not bruise/bleed easily.       Objective:   Blood pressure 112/76, pulse 76, temperature 98.3 F (36.8 C), temperature source Temporal, resp. rate 18, height 5\' 2"  (1.575 m), weight 201 lb 9.6 oz (91.4 kg), SpO2 97 %. Body mass index is 36.87 kg/m.   Physical Exam:   Physical Exam  Constitutional: She appears well-developed.  HENT:  Head: Normocephalic and atraumatic.  Right Ear: Tympanic membrane, external ear and ear canal normal. No drainage, swelling or tenderness. Tympanic membrane is not injected, not scarred, not erythematous, not retracted and not bulging.  Left Ear: Tympanic membrane, external ear and ear canal normal. No drainage, swelling or tenderness. Tympanic membrane is not injected, not  scarred, not erythematous, not retracted and not bulging.  Nose: Mucosal edema and rhinorrhea present. No nasal deformity or septal deviation. No epistaxis. Right sinus exhibits no maxillary sinus tenderness and no frontal sinus tenderness. Left sinus exhibits no maxillary sinus tenderness and no frontal sinus tenderness.  Mouth/Throat: Uvula is midline and oropharynx is clear and moist. Mucous membranes are not pale and not dry.  Cobblestoning present in the posterior oropharynx.   Eyes: Pupils are equal, round, and reactive to light. Conjunctivae and EOM are normal. Right eye exhibits no chemosis and no discharge. Left eye exhibits no chemosis and no discharge. Right conjunctiva is not injected. Left conjunctiva is not injected.  Cardiovascular: Normal rate, regular rhythm and normal heart sounds.  Respiratory: Effort normal and breath sounds normal. No accessory muscle usage. No tachypnea. No respiratory distress. She has no wheezes. She has no rhonchi. She has no rales. She exhibits no tenderness.  Moving air well in all lung fields. No crackles or wheezes noted.   GI: There is no abdominal tenderness. There is no rebound and no guarding.  Lymphadenopathy:       Head (right side): No submandibular, no tonsillar and no occipital adenopathy present.       Head (left side): No submandibular, no tonsillar and no occipital adenopathy present.    She has no cervical adenopathy.  Neurological: [name] alert.  Skin: No abrasion, no petechiae and no rash noted. Rash is not papular, not vesicular and not urticarial. No erythema. No pallor.  There is some mild dermatographism, but no overt urticaria appreciated today. She does have some cell phone pictures of urticaria to show me today.   Psychiatric: She has a normal mood and affect.     Diagnostic studies:   Allergy Studies:    Airborne Adult Perc - 02/15/19 0912    Time Antigen Placed  0920    Allergen Manufacturer  Waynette Buttery    Location  Back     Number of Test  59    1. Control-Buffer  50% Glycerol  Negative    2. Control-Histamine 1 mg/ml  2+    3. Albumin saline  Negative    4. [place]  3+    5. [place]  3+    6. Johnson  3+    7. [place] Blue  3+    8. Meadow Fescue  2+    9. Perennial Rye  3+    10. Sweet Vernal  3+    11. Timothy  4+    12. Cocklebur  2+    13. Burweed Marshelder  2+    14. Ragweed, short  4+    15. Ragweed, Giant  2+    16. Plantain,  English  Negative    17. Lamb's Quarters  Negative    18. Sheep Sorrell  2+    19. Rough Pigweed  Negative    20. Marsh Elder, Rough  Negative    21. Mugwort, Common  Negative    22. Ash mix  Negative    23. Birch mix  Negative    24. Beech American  Negative    25. Box, Elder  Negative    26. Cedar, red  Negative    27. Cottonwood, [place]  Negative    28. Elm mix  Negative    29. Hickory mix  Negative    30. Maple mix  Negative    31. Oak, [place] mix  Negative    32. Pecan Pollen  Negative    33. Pine mix  Negative    34. Sycamore Eastern  Negative    35. [place], Black Pollen  Negative    36. Alternaria alternata  Negative    37. Cladosporium Herbarum  Negative    38. Aspergillus mix  Negative    39. Penicillium mix  Negative    40. Bipolaris sorokiniana (Helminthosporium)  Negative    41. Drechslera spicifera (Curvularia)  Negative    42. Mucor plumbeus  Negative    43. Fusarium moniliforme  Negative    44. Aureobasidium pullulans (pullulara)  Negative    45. Rhizopus oryzae  Negative    46. Botrytis cinera  Negative    47. Epicoccum nigrum  Negative    48. Phoma betae  Negative    49. Candida Albicans  Negative    50. Trichophyton mentagrophytes  Negative    51. Mite, D Farinae  5,000 AU/ml  2+    52. Mite, D Pteronyssinus  5,000 AU/ml  2+    53. Cat Hair 10,000 BAU/ml  Negative    54.  Dog Epithelia  Negative    55. Mixed Feathers  Negative    56. Horse Epithelia  Negative    57. Cockroach, German  Negative    58. Mouse  Negative    59.  Tobacco Leaf  Negative     Intradermal - 02/15/19 0950    Time Antigen Placed  0950    Allergen Manufacturer  Lavella Hammock    Location  Arm    Number of Test  9    Intradermal  Select    Control  Negative    Tree mix  2+    Mold 1  4+    Mold 2  4+    Mold 3  Negative    Mold 4  2+    Cat  1+    Dog  Negative    Cockroach  2+       Allergy testing results were read and interpreted by myself, documented  by clinical staff.         Salvatore Marvel, MD [facility]

## 2019-02-15 NOTE — Progress Notes (Signed)
VIALS EXP 02-15-20 

## 2019-02-16 ENCOUNTER — Telehealth: Payer: Self-pay

## 2019-02-16 DIAGNOSIS — J3081 Allergic rhinitis due to animal (cat) (dog) hair and dander: Secondary | ICD-10-CM

## 2019-02-16 NOTE — Telephone Encounter (Addendum)
Returned call to patient.  Pt was asking why she was on famotidine.  Explained that this medication is used to help control hives.  Pt has no further questions. Call ended.

## 2019-02-16 NOTE — Telephone Encounter (Signed)
Patient is requesting a nurse to give her a call back about one of the medications prescribed.   Thanks

## 2019-02-20 DIAGNOSIS — J3089 Other allergic rhinitis: Secondary | ICD-10-CM

## 2019-03-08 ENCOUNTER — Ambulatory Visit (INDEPENDENT_AMBULATORY_CARE_PROVIDER_SITE_OTHER): Payer: PRIVATE HEALTH INSURANCE

## 2019-03-08 DIAGNOSIS — J309 Allergic rhinitis, unspecified: Secondary | ICD-10-CM | POA: Diagnosis not present

## 2019-03-15 ENCOUNTER — Ambulatory Visit (INDEPENDENT_AMBULATORY_CARE_PROVIDER_SITE_OTHER): Payer: PRIVATE HEALTH INSURANCE

## 2019-03-15 DIAGNOSIS — J309 Allergic rhinitis, unspecified: Secondary | ICD-10-CM | POA: Diagnosis not present

## 2019-03-15 MED ORDER — EPINEPHRINE 0.3 MG/0.3ML IJ SOAJ: 0.3000 mg | Freq: Once | INTRAMUSCULAR | 2 refills | Status: AC

## 2019-03-22 ENCOUNTER — Ambulatory Visit (INDEPENDENT_AMBULATORY_CARE_PROVIDER_SITE_OTHER): Payer: PRIVATE HEALTH INSURANCE

## 2019-03-22 DIAGNOSIS — J309 Allergic rhinitis, unspecified: Secondary | ICD-10-CM

## 2019-03-29 ENCOUNTER — Ambulatory Visit (INDEPENDENT_AMBULATORY_CARE_PROVIDER_SITE_OTHER): Payer: PRIVATE HEALTH INSURANCE

## 2019-03-29 DIAGNOSIS — J309 Allergic rhinitis, unspecified: Secondary | ICD-10-CM | POA: Diagnosis not present

## 2019-04-05 ENCOUNTER — Encounter (HOSPITAL_COMMUNITY): Payer: Self-pay | Admitting: Physical Therapy

## 2019-04-05 ENCOUNTER — Ambulatory Visit (HOSPITAL_COMMUNITY): Payer: PRIVATE HEALTH INSURANCE | Attending: Internal Medicine | Admitting: Physical Therapy

## 2019-04-05 ENCOUNTER — Ambulatory Visit (INDEPENDENT_AMBULATORY_CARE_PROVIDER_SITE_OTHER): Payer: PRIVATE HEALTH INSURANCE | Admitting: *Deleted

## 2019-04-05 ENCOUNTER — Other Ambulatory Visit: Payer: Self-pay

## 2019-04-05 DIAGNOSIS — M545 Low back pain, unspecified: Secondary | ICD-10-CM

## 2019-04-05 DIAGNOSIS — G8929 Other chronic pain: Secondary | ICD-10-CM | POA: Diagnosis present

## 2019-04-05 DIAGNOSIS — M25551 Pain in right hip: Secondary | ICD-10-CM

## 2019-04-05 DIAGNOSIS — J309 Allergic rhinitis, unspecified: Secondary | ICD-10-CM | POA: Diagnosis not present

## 2019-04-05 NOTE — Therapy (Signed)
[place], [place], [address] Phone: [phone]   Fax:  [phone]  Physical Therapy Evaluation  Patient Details  Name: Leah Phillips MRN: [id number] Date of Birth: 05/06/1971 Referring Provider (PT): Dr. Manuella Ghazi   Encounter Date: 04/05/2019  PT End of Session - 04/05/19 1404    Visit Number  1    Number of Visits  12    Date for PT Re-Evaluation  05/17/19   mini reassessment: 04/26/19   Authorization Type  [employer] [place] Time Period  04/05/2019 - 05/17/2019    Authorization - Visit Number  1    Authorization - Number of Visits  30    PT Start Time  8341    PT Stop Time  1348    PT Time Calculation (min)  43 min    Activity Tolerance  Patient tolerated treatment well    Behavior During Therapy  [facility] for tasks assessed/performed       Past Medical History:  Diagnosis Date  . Angio-edema   . Carpal tunnel syndrome   . Urticaria     Past Surgical History:  Procedure Laterality Date  . ANTERIOR CRUCIATE LIGAMENT REPAIR Left   . CARPAL TUNNEL RELEASE Right 04/01/2015   Procedure: Right Carpal Tunnel Release;  Surgeon: Marybelle Killings, MD;  Location: [facility];  Service: Orthopedics;  Laterality: Right;  . CHOLECYSTECTOMY    . EXCISION METACARPAL MASS Right 04/01/2015   Procedure: Excision Right Long Finger Mass;  Surgeon: Marybelle Killings, MD;  Location: [facility];  Service: Orthopedics;  Laterality: Right;    There were no vitals filed for this visit.   Subjective Assessment - 04/05/19 1314    Subjective  Patient reported that she has pain which is mostly in her right hip crease. She stated that when it first began her pain made it difficult for her to cross her leg over and to roll over in bed. She reported that she has been taking prednisone and it has helped some to decrease her symptoms. She states pain is worse with weight bearing, stairs, sitting  for extended periods of time and with bed mobility. Patient reported her max pain is a 5/10 currently and it was more than a 10/10 before the prednisone. She reported that she has some popping in her hip at times with leg movements. She stated that she has sharp pain at times in her hip. Patient reported some history of back pain and that her back and hip pain may be related. Denied any tingling or numbness.    Limitations  Sitting;Lifting;House hold activities    How long can you sit comfortably?  30 minutes    How long can you stand comfortably?  Not limited    How long can you walk comfortably?  2-3 hours    Patient Stated Goals  Decreased pain    Currently in Pain?  No/denies   pain at worst 10/10 in last week        [facility] PT Assessment - 04/05/19 0001      Assessment   Medical Diagnosis  Right hip pain    Referring Provider (PT)  Dr. Manuella Ghazi    Onset Date/Surgical Date  --   2-3 months ago   Next MD Visit  04/07/19    Prior Therapy  Yes for ankle      Precautions   Precautions  None  Restrictions   Weight Bearing Restrictions  No      Prior Function   Vocation  Full time employment    Media planner - making things      Cognition   Overall Cognitive Status  Within Functional Limits for tasks assessed      Observation/Other Assessments   Focus on Therapeutic Outcomes (FOTO)   Perform next session      ROM / Strength   AROM / PROM / Strength  AROM;Strength;PROM      AROM   AROM Assessment Site  Hip;Lumbar    Right/Left Hip  Right;Left    Lumbar Flexion  WFL    Lumbar Extension  WFL    Lumbar - Right Side Bend  WFL    Lumbar - Left Side Bend  WFL    Lumbar - Right Rotation  WFL    Lumbar - Left Rotation  WFL      PROM   Overall PROM Comments  Pain with ER/IR on R    PROM Assessment Site  Hip    Right/Left Hip  Right;Left    Right Hip External Rotation   --   pain   Right Hip Internal Rotation   --   pain   Left Hip  Extension  --   WFL   Left Hip Flexion  --   [facility]     Strength   Strength Assessment Site  Hip;Knee;Ankle    Right/Left Hip  Right;Left    Right Hip Flexion  4+/5   painful, knee and thigh   Right Hip Extension  5/5    Right Hip ABduction  5/5    Left Hip Flexion  5/5    Left Hip Extension  5/5    Left Hip ABduction  5/5    Right/Left Knee  Right;Left    Right Knee Flexion  5/5    Right Knee Extension  5/5    Left Knee Flexion  5/5    Left Knee Extension  5/5    Right/Left Ankle  Right;Left    Right Ankle Dorsiflexion  5/5   painful   Left Ankle Dorsiflexion  5/5      Flexibility   Soft Tissue Assessment /Muscle Length  yes    Hamstrings  painful on R      Palpation   Palpation comment  Tender to Palpation: R PSIS,  Right L 4-5 transverse process, R glute med/max toward greater trochanter      Special Tests    Special Tests  Hip Special Tests    Hip Special Tests   Hip Scouring      Hip Scouring   Findings  Positive    Side  Right    Comments  --   painful on R     Transfers   Five time sit to stand comments   15 seconds      Ambulation/Gait   Ambulation Distance (Feet)  450 Feet   2 MWT   Assistive device  None    Gait Pattern  Within Functional Limits    Gait Comments  pain beginning at end of 2 MWT                Objective measurements completed on examination: See above findings.              PT Education - 04/05/19 1402    Education Details  Patient educated on examination findings, POC,  continuing daily exercise, and diagnosis.    Person(s) Educated  Patient    Methods  Explanation    Comprehension  Verbalized understanding       PT Short Term Goals - 04/05/19 1420      PT SHORT TERM GOAL #1   Title  Patient will be independent with HEP in order to optimize outcomes    Time  3    Period  Weeks    Status  New    Target Date  04/26/19      PT SHORT TERM GOAL #2   Title  Patient will report that pain will be no greater  than 3/10 indicating improved tolerance to physical activity.    Time  3    Period  Weeks    Status  New    Target Date  04/26/19        PT Long Term Goals - 04/05/19 1424      PT LONG TERM GOAL #1   Title  Patient will deny pain at the end of 2 MWT to indicate improved tolerance to daily mobility.    Time  6    Period  Weeks    Status  New    Target Date  05/17/19      PT LONG TERM GOAL #2   Title  Patient will be able to resume all ADL unrestriced for full return to daily acitities.    Time  6    Period  Weeks    Status  New    Target Date  05/17/19      PT LONG TERM GOAL #3   Title  Patient will perform R hip MMT 5/5 in all planes without symptoms to demonstrate improved strength for daily activity.    Time  6    Period  Weeks    Status  New    Target Date  05/17/19             Plan - 04/05/19 1408    Clinical Impression Statement  Patient is a 48 year old female with primary c/o R hip pain which began with gradual onset. Patient presents to physical therapy with pain, impaired ROM, strength, and functional mobility. Patient's impairments are causing difficulty in completing ADL. Patient also presenting with low back symptoms which may be contributing to her R hip pain. Patient will benefit from physical therapy to reduce impairment and improve function.    Personal Factors and Comorbidities  Past/Current Experience;Behavior Pattern;Comorbidity 1    Comorbidities  Hypertension    Examination-Activity Limitations  Bed Mobility;Bend;Squat;Stairs;Transfers;Sit;Sleep    Examination-Participation Restrictions  Yard Work    Stability/Clinical Decision Making  Evolving/Moderate complexity    Clinical Decision Making  Moderate    Rehab Potential  Good    PT Frequency  2x / week    PT Duration  6 weeks    PT Treatment/Interventions  ADLs/Self Care Home Management;Electrical Stimulation;Moist Heat;Traction;DME Instruction;Gait training;Stair training;Functional mobility  training;Therapeutic activities;Therapeutic exercise;Balance training;Neuromuscular re-education;Patient/family education;Orthotic Fit/Training;Manual techniques;Passive range of motion;Dry needling;Energy conservation;Taping;Spinal Manipulations;Joint Manipulations    PT Next Visit Plan  Further assess lumbar spine, FOTO, initiate HEP such as core strength/hip strenthening    PT Home Exercise Plan  Initiate first treatment session    Consulted and Agree with Plan of Care  Patient       Patient will benefit from skilled therapeutic intervention in order to improve the following deficits and impairments:  Decreased activity tolerance, Decreased endurance, Decreased mobility, Decreased strength,  Difficulty walking, Impaired flexibility, Increased muscle spasms, Pain  Visit Diagnosis: Pain in right hip  Chronic low back pain, unspecified back pain laterality, unspecified whether sciatica present     Problem List Patient Active Problem List   Diagnosis Date Noted  . Seasonal and perennial allergic rhinitis 02/15/2019  . Allergic urticaria 02/15/2019  . Partial tear of left Achilles tendon 08/25/2018  . Bunion of great toe of right foot 09/02/2017  . Ganglion cyst of finger of right hand 02/04/2017  . Acute pain of left knee 01/14/2017  . Right elbow pain 01/14/2017  . Pes anserinus bursitis 02/15/2014  . PRURITUS 10/15/2009  . FATIGUE 07/23/2009  . SHOULDER PAIN, RIGHT 06/21/2009  . NECK PAIN 05/06/2009  . BACK PAIN 02/21/2009  . HYPERTENSION 10/02/2008  . KNEE PAIN, RIGHT 09/27/2008  . FOLLICULITIS 07/29/2008  . UNSPECIFIED VAGINITIS AND VULVOVAGINITIS 01/04/2008  . 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 OBESITY, UNSPECIFIED 11/11/2007  . ALLERGIC RHINITIS, SEASONAL 11/11/2007  . A C L SPRAIN-CHRONIC 06/23/2007  . TEAR A C L 06/23/2007     Verne CarrowMacy Rande Dario PT, DPT 2:31 PM, 04/05/19 [phone]  Alonna MiniumAndrew Zaunegger PT, DPT   [facility] [address] [place], [place], [address] Phone: [phone]   Fax:  [phone]  Name: Leah Phillips MRN: [id number] Date of Birth: [birth year]

## 2019-04-12 ENCOUNTER — Ambulatory Visit (HOSPITAL_COMMUNITY): Payer: PRIVATE HEALTH INSURANCE | Attending: Internal Medicine | Admitting: Physical Therapy

## 2019-04-12 ENCOUNTER — Encounter (HOSPITAL_COMMUNITY): Payer: Self-pay | Admitting: Physical Therapy

## 2019-04-12 ENCOUNTER — Ambulatory Visit (INDEPENDENT_AMBULATORY_CARE_PROVIDER_SITE_OTHER): Payer: PRIVATE HEALTH INSURANCE

## 2019-04-12 ENCOUNTER — Other Ambulatory Visit: Payer: Self-pay

## 2019-04-12 DIAGNOSIS — M545 Low back pain, unspecified: Secondary | ICD-10-CM

## 2019-04-12 DIAGNOSIS — G8929 Other chronic pain: Secondary | ICD-10-CM | POA: Diagnosis present

## 2019-04-12 DIAGNOSIS — J309 Allergic rhinitis, unspecified: Secondary | ICD-10-CM | POA: Diagnosis not present

## 2019-04-12 DIAGNOSIS — M25551 Pain in right hip: Secondary | ICD-10-CM

## 2019-04-12 NOTE — Therapy (Addendum)
[facility] [address] [place], [place], [address] Phone: [phone]   Fax:  [phone]  Physical Therapy Treatment  Patient Details  Name: Leah Phillips MRN: [id number] Date of Birth: 11-15-1970 Referring Provider (PT): Dr. Sherryll BurgerShah   Encounter Date: 04/12/2019  PT End of Session - 04/12/19 1455    Visit Number  2    Number of Visits  12    Date for PT Re-Evaluation  05/17/19   mini reassessment: 04/26/19   Authorization Type  [facility]    Authorization Time Period  04/05/2019 - 05/17/2019    Authorization - Visit Number  2    Authorization - Number of Visits  30    PT Start Time  1400   patient arrives 15 minutes late   PT Stop Time  1432    PT Time Calculation (min)  32 min    Activity Tolerance  Patient tolerated treatment well    Behavior During Therapy  [facility] for tasks assessed/performed       Past Medical History:  Diagnosis Date  . Angio-edema   . Carpal tunnel syndrome   . Urticaria     Past Surgical History:  Procedure Laterality Date  . ANTERIOR CRUCIATE LIGAMENT REPAIR Left   . CARPAL TUNNEL RELEASE Right 04/01/2015   Procedure: Right Carpal Tunnel Release;  Surgeon: Eldred MangesMark C Yates, MD;  Location: [facility];  Service: Orthopedics;  Laterality: Right;  . CHOLECYSTECTOMY    . EXCISION METACARPAL MASS Right 04/01/2015   Procedure: Excision Right Long Finger Mass;  Surgeon: Eldred MangesMark C Yates, MD;  Location: [facility];  Service: Orthopedics;  Laterality: Right;    There were no vitals filed for this visit.  Subjective Assessment - 04/12/19 1404    Subjective  Patient reports she was sore over the weekend but it eased up on Sunday. [name] tender when climbing up the stairs. She has been able to cross her legs more but notices a big difference since taking the prednisone. Denies pain today, pain at worst 5/10.    Limitations  Sitting;Lifting;House hold activities    How long  can you sit comfortably?  30 minutes    How long can you stand comfortably?  Not limited    How long can you walk comfortably?  2-3 hours    Patient Stated Goals  Decreased pain    Currently in Pain?  No/denies   worst pain 5/10        [facility] PT Assessment - 04/12/19 0001      Observation/Other Assessments   Focus on Therapeutic Outcomes (FOTO)   41% limited      AROM   Lumbar Flexion  WFL, pressure in hip    Lumbar Extension  WFL, felt good   repeated ext standing: 2x5;repeated ext prone 1x10 no change   Lumbar - Right Side Bend  WFL, pain in back    Lumbar - Left Side Bend  WFL    Lumbar - Right Rotation  WFL    Lumbar - Left Rotation  WFL      Palpation   Spinal mobility  L1-L3 normal; L4-L5 hypomobile and painful    Palpation comment  Tender to palpation R glute med/max, CPA L1-L3: not painful; CPA L4-L5 painful, UPA L4-L5 painful and vague symptom reproduction in anterior hip, TTP: R PSIS, sacrum  [facility] Adult PT Treatment/Exercise - 04/12/19 0001      Manual Therapy   Manual Therapy  Soft tissue mobilization;Joint mobilization    Manual therapy comments  separate from the rest of treatment    Joint Mobilization  Grade 1 UPA L4-L5, grade I CPA L4-L5    Soft tissue mobilization  gentle on R lumbar paraspinals             PT Education - 04/12/19 1453    Education Details  patient educated on current condition, daily exercise, and self gentle STM on LB for desensitization    Person(s) Educated  Patient    Methods  Explanation    Comprehension  Verbalized understanding       PT Short Term Goals - 04/05/19 1420      PT SHORT TERM GOAL #1   Title  Patient will be independent with HEP in order to optimize outcomes    Time  3    Period  Weeks    Status  New    Target Date  04/26/19      PT SHORT TERM GOAL #2   Title  Patient will report that pain will be no greater than 3/10 indicating improved tolerance to physical activity.     Time  3    Period  Weeks    Status  New    Target Date  04/26/19        PT Long Term Goals - 04/05/19 1424      PT LONG TERM GOAL #1   Title  Patient will deny pain at the end of 2 MWT to indicate improved tolerance to daily mobility.    Time  6    Period  Weeks    Status  New    Target Date  05/17/19      PT LONG TERM GOAL #2   Title  Patient will be able to resume all ADL unrestriced for full return to daily acitities.    Time  6    Period  Weeks    Status  New    Target Date  05/17/19      PT LONG TERM GOAL #3   Title  Patient will perform R hip MMT 5/5 in all planes without symptoms to demonstrate improved strength for daily activity.    Time  6    Period  Weeks    Status  New    Target Date  05/17/19            Plan - 04/12/19 1504    Clinical Impression Statement  Patient session time limited secondary to patient arriving 15 minutes late. Patient experiences low back pain with lumbar ROM but does not experience anterior hip symptoms will lumbar AROM. Repeated extension in standing and prone had no change on lumbar or hip symptoms. Patient experiences allodynia with light touch throughout lower lumbar spine with vague anterior hip symptom reproduction in anterior hip/thigh. Patient's lumbar spine may be contributing to anterior hip symptoms but unable to rule out lumbar vs. hip pathology at this time. Continue to assess lumbar spine and response to manual therapy to determine lumbar/hip contribution to symptoms. Patient will continue to benefit from skilled physical therapy in order to reduce impairment and improve function.    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 complexity    Rehab  Potential  Good    PT Frequency  2x / week    PT Duration  6 weeks    PT Treatment/Interventions  ADLs/Self Care Home Management;Electrical Stimulation;Moist Heat;Traction;DME Instruction;Gait training;Stair training;Functional mobility training;Therapeutic activities;Therapeutic exercise;Balance training;Neuromuscular re-education;Patient/family education;Orthotic Fit/Training;Manual techniques;Passive range of motion;Dry needling;Energy conservation;Taping;Spinal Manipulations;Joint Manipulations    PT Next Visit Plan  Further assess lumbar spine/hip, assess response to manual therapy,  initiate HEP such as core strength/hip strenthening    PT Home Exercise Plan  04/12/19: gentle self STM of lumbar spine    Consulted and Agree with Plan of Care  Patient       Patient will benefit from skilled therapeutic intervention in order to improve the following deficits and impairments:  Decreased activity tolerance, Decreased endurance, Decreased mobility, Decreased strength, Difficulty walking, Impaired flexibility, Increased muscle spasms, Pain  Visit Diagnosis: Pain in right hip  Chronic low back pain, unspecified back pain laterality, unspecified whether sciatica present     Problem List Patient Active Problem List   Diagnosis Date Noted  . Seasonal and perennial allergic rhinitis 02/15/2019  . Allergic urticaria 02/15/2019  . Partial tear of left Achilles tendon 08/25/2018  . Bunion of great toe of right foot 09/02/2017  . Ganglion cyst of finger of right hand 02/04/2017  . Acute pain of left knee 01/14/2017  . Right elbow pain 01/14/2017  . Pes anserinus bursitis 02/15/2014  . PRURITUS 10/15/2009  . FATIGUE 07/23/2009  . SHOULDER PAIN, RIGHT 06/21/2009  . NECK PAIN 05/06/2009  . BACK PAIN 02/21/2009  . HYPERTENSION 10/02/2008  . KNEE PAIN, RIGHT 09/27/2008  . FOLLICULITIS 13/24/4010  . UNSPECIFIED VAGINITIS AND VULVOVAGINITIS 01/04/2008  . DYSFUNCTIONAL UTERINE BLEEDING  01/04/2008  . OBESITY, UNSPECIFIED 11/11/2007  . ALLERGIC RHINITIS, SEASONAL 11/11/2007  . A C L SPRAIN-CHRONIC 06/23/2007  . TEAR A C L 06/23/2007   3:08 PM, 04/12/19 Mearl Latin PT, DPT Physical Therapist at [place], [place], [address] Phone: [phone]   Fax:  [phone]  Name: Leah Phillips MRN: [id number] Date of Birth: Aug 24, 1970

## 2019-04-14 ENCOUNTER — Other Ambulatory Visit: Payer: Self-pay

## 2019-04-14 ENCOUNTER — Encounter (HOSPITAL_COMMUNITY): Payer: Self-pay | Admitting: Physical Therapy

## 2019-04-14 ENCOUNTER — Ambulatory Visit (HOSPITAL_COMMUNITY): Payer: PRIVATE HEALTH INSURANCE | Admitting: Physical Therapy

## 2019-04-14 DIAGNOSIS — M545 Low back pain, unspecified: Secondary | ICD-10-CM

## 2019-04-14 DIAGNOSIS — M25551 Pain in right hip: Secondary | ICD-10-CM | POA: Diagnosis not present

## 2019-04-14 DIAGNOSIS — G8929 Other chronic pain: Secondary | ICD-10-CM

## 2019-04-14 NOTE — Therapy (Signed)
[facility] [address] [place], [place], [address] Phone: [phone]   Fax:  [phone]  Physical Therapy Treatment  Patient Details  Name: Leah Phillips MRN: [id number] Date of Birth: 23-Feb-1971 Referring Provider (PT): Dr. Sherryll BurgerShah   Encounter Date: 04/14/2019  PT End of Session - 04/14/19 1530    Visit Number  3    Number of Visits  12    Date for PT Re-Evaluation  05/17/19   mini reassessment: 04/26/19   Authorization Type  [employer] [facility]     Authorization Time Period  04/05/2019 - 05/17/2019    Authorization - Visit Number  3    Authorization - Number of Visits  30    PT Start Time  1427    PT Stop Time  1512    PT Time Calculation (min)  45 min    Activity Tolerance  Patient tolerated treatment well    Behavior During Therapy  [facility] for tasks assessed/performed       Past Medical History:  Diagnosis Date  . Angio-edema   . Carpal tunnel syndrome   . Urticaria     Past Surgical History:  Procedure Laterality Date  . ANTERIOR CRUCIATE LIGAMENT REPAIR Left   . CARPAL TUNNEL RELEASE Right 04/01/2015   Procedure: Right Carpal Tunnel Release;  Surgeon: Eldred MangesMark C Yates, MD;  Location: [facility];  Service: Orthopedics;  Laterality: Right;  . CHOLECYSTECTOMY    . EXCISION METACARPAL MASS Right 04/01/2015   Procedure: Excision Right Long Finger Mass;  Surgeon: Eldred MangesMark C Yates, MD;  Location: [facility];  Service: Orthopedics;  Laterality: Right;    There were no vitals filed for this visit.  Subjective Assessment - 04/14/19 1429    Subjective  Patient states she is still having the same pain in the front of her hip. She notices it when she's walking or rolls over or moves a certain way. She was sore in her low back after last session.    Limitations  Sitting;Lifting;House hold activities    How long can you sit comfortably?  30 minutes    How long can you stand comfortably?  Not limited     How long can you walk comfortably?  2-3 hours    Patient Stated Goals  Decreased pain    Currently in Pain?  No/denies                       [facility]/Exercise - 04/14/19 0001      Exercises   Exercises  Lumbar      Lumbar Exercises: Supine   Ab Set  20 reps    AB Set Limitations  ab set with LE slide outs 1x10 each leg    Bridge  Other (comment)    Bridge Limitations  3x10 with 2 second hold    Straight Leg Raise  10 reps   both legs, increased symptoms in RLE, discontinued     Manual Therapy   Manual Therapy  Soft tissue mobilization;Joint mobilization    Manual therapy comments  separate from the rest of treatment    Joint Mobilization  Grade 1 R UPA L4-L5, grade I CPA L4-L5, GradeII-III L UPA L4-L5    Soft tissue mobilization  gentle on R lumbar paraspinals, Self STM with tennis ball in standing at wall on lumbar paraspinals             PT Education -  04/14/19 1527    Education Details  Patient educated on performing STM with ball and performing HEP.    Person(s) Educated  Patient    Methods  Explanation;Handout    Comprehension  Verbalized understanding       PT Short Term Goals - 04/05/19 1420      PT SHORT TERM GOAL #1   Title  Patient will be independent with HEP in order to optimize outcomes    Time  3    Period  Weeks    Status  New    Target Date  04/26/19      PT SHORT TERM GOAL #2   Title  Patient will report that pain will be no greater than 3/10 indicating improved tolerance to physical activity.    Time  3    Period  Weeks    Status  New    Target Date  04/26/19        PT Long Term Goals - 04/05/19 1424      PT LONG TERM GOAL #1   Title  Patient will deny pain at the end of 2 MWT to indicate improved tolerance to daily mobility.    Time  6    Period  Weeks    Status  New    Target Date  05/17/19      PT LONG TERM GOAL #2   Title  Patient will be able to resume all ADL unrestriced for full return to  daily acitities.    Time  6    Period  Weeks    Status  New    Target Date  05/17/19      PT LONG TERM GOAL #3   Title  Patient will perform R hip MMT 5/5 in all planes without symptoms to demonstrate improved strength for daily activity.    Time  6    Period  Weeks    Status  New    Target Date  05/17/19            Plan - 04/14/19 1532    Clinical Impression Statement  Patient continues to experience low back and anterior hip symptoms with gentle mobilization/STM of lumbar spine. She does not tolerate much pressure throughout lower lumbar region on R side. [name] educated and able to perform self STM with ball at wall on lumbar musculature to decrease tissue tension and pain while at home. While doing so, she continues to reproduce anterior hip symptoms which do not change while performing but decrease following. Patient experiences moderate increase in anterior hip symptoms with straight leg raises, which are then discontinued, and bridges and ab sets with LE slide outs with minimal increase in symptoms. Patient will continue to benefit from skilled physical therapy in order to reduce impairment and improve function.    Personal Factors and Comorbidities  Past/Current Experience;Behavior Pattern;Comorbidity 1    Comorbidities  Hypertension    Examination-Activity Limitations  Bed Mobility;Bend;Squat;Stairs;Transfers;Sit;Sleep    Examination-Participation Restrictions  Yard Work    Conservation officer, historic buildings  Evolving/Moderate complexity    Rehab Potential  Good    PT Frequency  2x / week    PT Duration  6 weeks    PT Treatment/Interventions  ADLs/Self Care Home Management;Electrical Stimulation;Moist Heat;Traction;DME Instruction;Gait training;Stair training;Functional mobility training;Therapeutic activities;Therapeutic exercise;Balance training;Neuromuscular re-education;Patient/family education;Orthotic Fit/Training;Manual techniques;Passive range of motion;Dry  needling;Energy conservation;Taping;Spinal Manipulations;Joint Manipulations    PT Next Visit Plan  assess response to manual therapy,  progress  core strength/hip strenthening  PT Home Exercise Plan  04/12/19: gentle self STM of lumbar spine11/6/20: STM with ball of lumbar spine    Consulted and Agree with Plan of Care  Patient       Patient will benefit from skilled therapeutic intervention in order to improve the following deficits and impairments:  Decreased activity tolerance, Decreased endurance, Decreased mobility, Decreased strength, Difficulty walking, Impaired flexibility, Increased muscle spasms, Pain  Visit Diagnosis: Pain in right hip  Chronic low back pain, unspecified back pain laterality, unspecified whether sciatica present     Problem List Patient Active Problem List   Diagnosis Date Noted  . Seasonal and perennial allergic rhinitis 02/15/2019  . Allergic urticaria 02/15/2019  . Partial tear of left Achilles tendon 08/25/2018  . Bunion of great toe of right foot 09/02/2017  . Ganglion cyst of finger of right hand 02/04/2017  . Acute pain of left knee 01/14/2017  . Right elbow pain 01/14/2017  . Pes anserinus bursitis 02/15/2014  . PRURITUS 10/15/2009  . FATIGUE 07/23/2009  . SHOULDER PAIN, RIGHT 06/21/2009  . NECK PAIN 05/06/2009  . BACK PAIN 02/21/2009  . HYPERTENSION 10/02/2008  . KNEE PAIN, RIGHT 09/27/2008  . FOLLICULITIS 90/24/0973  . UNSPECIFIED VAGINITIS AND VULVOVAGINITIS 01/04/2008  . 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 OBESITY, UNSPECIFIED 11/11/2007  . ALLERGIC RHINITIS, SEASONAL 11/11/2007  . A C L SPRAIN-CHRONIC 06/23/2007  . TEAR A C L 06/23/2007    3:52 PM, 04/14/19 Mearl Latin PT, DPT Physical Therapist at [place], [place], [address] Phone: [phone]   Fax:  [phone]  Name: Leah Phillips MRN:  [id number] Date of Birth: [birth year]

## 2019-04-17 ENCOUNTER — Telehealth (HOSPITAL_COMMUNITY): Payer: Self-pay | Admitting: Physical Therapy

## 2019-04-17 ENCOUNTER — Ambulatory Visit (HOSPITAL_COMMUNITY): Payer: PRIVATE HEALTH INSURANCE | Admitting: Physical Therapy

## 2019-04-17 NOTE — Telephone Encounter (Signed)
No show #1. Called patient and she apologized, she had gotten her times mixed up as she is usually treated in the afternoon. Confirmed tomorrow's appointment and did not reschedule today's visit secondary to patient going out of town on Wednesday.   9:00 AM, 04/17/19 Jerene Pitch, DPT Physical Therapy with [facility]  [phone] office

## 2019-04-18 ENCOUNTER — Encounter (HOSPITAL_COMMUNITY): Payer: Self-pay | Admitting: Physical Therapy

## 2019-04-18 ENCOUNTER — Other Ambulatory Visit: Payer: Self-pay

## 2019-04-18 ENCOUNTER — Ambulatory Visit (HOSPITAL_COMMUNITY): Payer: PRIVATE HEALTH INSURANCE | Admitting: Physical Therapy

## 2019-04-18 DIAGNOSIS — M545 Low back pain, unspecified: Secondary | ICD-10-CM

## 2019-04-18 DIAGNOSIS — M25551 Pain in right hip: Secondary | ICD-10-CM

## 2019-04-18 DIAGNOSIS — G8929 Other chronic pain: Secondary | ICD-10-CM

## 2019-04-18 NOTE — Therapy (Signed)
[place] [facility] [address] [place], [place], [address] Phone: [phone]   Fax:  [phone]  Physical Therapy Treatment  Patient Details  Name: Leah Phillips MRN: [id number] Date of Birth: [birth year] Referring Provider (PT): Dr. Sherryll BurgerShah   Encounter Date: 04/18/2019  PT End of Session - 04/18/19 1609    Visit Number  4    Number of Visits  12    Date for PT Re-Evaluation  05/17/19   mini reassessment: 04/26/19   Authorization Type  [employer] [facility]     Authorization Time Period  04/05/2019 - 05/17/2019    Authorization - Visit Number  4    Authorization - Number of Visits  30    PT Start Time  1524   patient arrives late   PT Stop Time  1600    PT Time Calculation (min)  36 min    Activity Tolerance  Patient tolerated treatment well    Behavior During Therapy  [facility] for tasks assessed/performed       Past Medical History:  Diagnosis Date  . Angio-edema   . Carpal tunnel syndrome   . Urticaria     Past Surgical History:  Procedure Laterality Date  . ANTERIOR CRUCIATE LIGAMENT REPAIR Left   . CARPAL TUNNEL RELEASE Right 04/01/2015   Procedure: Right Carpal Tunnel Release;  Surgeon: Eldred MangesMark C Yates, MD;  Location: [facility];  Service: Orthopedics;  Laterality: Right;  . CHOLECYSTECTOMY    . EXCISION METACARPAL MASS Right 04/01/2015   Procedure: Excision Right Long Finger Mass;  Surgeon: Eldred MangesMark C Yates, MD;  Location: [facility];  Service: Orthopedics;  Laterality: Right;    There were no vitals filed for this visit.  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 She did a lot of cleaning at work consisting of sweeping, mopping, and vacuuming. [name] still having severe pain in her low back and front of her hip especially with bed mobility. Her pain is 7/10 right now.    Limitations  Sitting;Lifting;House hold activities    How  long can you sit comfortably?  30 minutes    How long can you stand comfortably?  Not limited    How long can you walk comfortably?  2-3 hours    Patient Stated Goals  Decreased pain    Currently in Pain?  Yes    Pain Score  7     Pain Location  Back    Pain Orientation  Right                       [facility] Adult PT Treatment/Exercise - 04/18/19 0001      Manual Therapy   Manual Therapy  Soft tissue mobilization;Joint mobilization    Manual therapy comments  separate from the rest of treatment    Joint Mobilization  Grade 1 R UPA L4-L5, grade I CPA L4-L5, GradeII-III L UPA L4-L5    Soft tissue mobilization  gentle on R lumbar paraspinals, Self STM with tennis ball in standing at wall on lumbar paraspinals             PT Education - 04/18/19 1608    Education Details  Patient educated on central sensitization, chronic pain, and allodynia as well as performing STM with ball and performing HEP    Person(s) Educated  Patient    Methods  Explanation  Comprehension  Verbalized understanding       PT Short Term Goals - 04/05/19 1420      PT SHORT TERM GOAL #1   Title  Patient will be independent with HEP in order to optimize outcomes    Time  3    Period  Weeks    Status  New    Target Date  04/26/19      PT SHORT TERM GOAL #2   Title  Patient will report that pain will be no greater than 3/10 indicating improved tolerance to physical activity.    Time  3    Period  Weeks    Status  New    Target Date  04/26/19        PT Long Term Goals - 04/05/19 1424      PT LONG TERM GOAL #1   Title  Patient will deny pain at the end of 2 MWT to indicate improved tolerance to daily mobility.    Time  6    Period  Weeks    Status  New    Target Date  05/17/19      PT LONG TERM GOAL #2   Title  Patient will be able to resume all ADL unrestriced for full return to daily acitities.    Time  6    Period  Weeks    Status  New    Target Date  05/17/19      PT  LONG TERM GOAL #3   Title  Patient will perform R hip MMT 5/5 in all planes without symptoms to demonstrate improved strength for daily activity.    Time  6    Period  Weeks    Status  New    Target Date  05/17/19            Plan - 04/18/19 1611    Clinical Impression Statement  Patient able to tolerate low grade lumbar mobilizations and STM of lumbar paraspinals following self STM with tennis ball. She demonstrates decreased sensitization to manual therapy compared to previous sessions. Patient feels some reduction in symptoms following session. [name] educated on chronic pain, central sensitization, and allodynia and the possible contribution to her symptoms. Patient will continue to benefit from skilled physical therapy in order to reduce impairment and improve function.    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 complexity    Rehab Potential  Good    PT Frequency  2x / week    PT Duration  6 weeks    PT Treatment/Interventions  ADLs/Self Care Home Management;Electrical Stimulation;Moist Heat;Traction;DME Instruction;Gait training;Stair training;Functional mobility training;Therapeutic activities;Therapeutic exercise;Balance training;Neuromuscular re-education;Patient/family education;Orthotic Fit/Training;Manual techniques;Passive range of motion;Dry needling;Energy conservation;Taping;Spinal Manipulations;Joint Manipulations    PT Next Visit Plan  assess response to manual therapy,  progress  core strength/hip strenthening    PT Home Exercise Plan  04/12/19: gentle self STM of lumbar spine11/6/20: STM with ball of lumbar spine    Consulted and Agree with Plan of Care  Patient       Patient will benefit from  skilled therapeutic intervention in order to improve the following deficits and impairments:  Decreased activity tolerance, Decreased endurance, Decreased mobility, Decreased strength, Difficulty walking, Impaired flexibility, Increased muscle spasms, Pain  Visit Diagnosis: Pain in right hip  Chronic  low back pain, unspecified back pain laterality, unspecified whether sciatica present     Problem List Patient Active Problem List   Diagnosis Date Noted  . Seasonal and perennial allergic rhinitis 02/15/2019  . Allergic urticaria 02/15/2019  . Partial tear of left Achilles tendon 08/25/2018  . Bunion of great toe of right foot 09/02/2017  . Ganglion cyst of finger of right hand 02/04/2017  . Acute pain of left knee 01/14/2017  . Right elbow pain 01/14/2017  . Pes anserinus bursitis 02/15/2014  . PRURITUS 10/15/2009  . FATIGUE 07/23/2009  . SHOULDER PAIN, RIGHT 06/21/2009  . NECK PAIN 05/06/2009  . BACK PAIN 02/21/2009  . HYPERTENSION 10/02/2008  . KNEE PAIN, RIGHT 09/27/2008  . FOLLICULITIS 07/29/2008  . UNSPECIFIED VAGINITIS AND VULVOVAGINITIS 01/04/2008  . 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 OBESITY, UNSPECIFIED 11/11/2007  . ALLERGIC RHINITIS, SEASONAL 11/11/2007  . A C L SPRAIN-CHRONIC 06/23/2007  . TEAR A C L 06/23/2007    4:17 PM, 04/18/19 Wyman Songster PT, DPT Physical Therapist at [facility]   [facility] [address] [place], [place], [address] Phone: [phone]   Fax:  [phone]  Name: Leah Phillips MRN: [id number] Date of Birth: 10/19/1970

## 2019-04-19 ENCOUNTER — Ambulatory Visit (INDEPENDENT_AMBULATORY_CARE_PROVIDER_SITE_OTHER): Payer: PRIVATE HEALTH INSURANCE

## 2019-04-19 DIAGNOSIS — J309 Allergic rhinitis, unspecified: Secondary | ICD-10-CM | POA: Diagnosis not present

## 2019-04-21 ENCOUNTER — Encounter (HOSPITAL_COMMUNITY): Payer: PRIVATE HEALTH INSURANCE

## 2019-04-24 ENCOUNTER — Encounter (HOSPITAL_COMMUNITY): Payer: PRIVATE HEALTH INSURANCE | Admitting: Physical Therapy

## 2019-04-26 ENCOUNTER — Encounter (HOSPITAL_COMMUNITY): Payer: PRIVATE HEALTH INSURANCE | Admitting: Physical Therapy

## 2019-04-26 ENCOUNTER — Ambulatory Visit (HOSPITAL_COMMUNITY): Payer: PRIVATE HEALTH INSURANCE | Admitting: Physical Therapy

## 2019-04-27 ENCOUNTER — Other Ambulatory Visit: Payer: Self-pay

## 2019-04-27 ENCOUNTER — Encounter (HOSPITAL_COMMUNITY): Payer: Self-pay

## 2019-04-27 ENCOUNTER — Ambulatory Visit (HOSPITAL_COMMUNITY): Payer: PRIVATE HEALTH INSURANCE

## 2019-04-27 DIAGNOSIS — M25551 Pain in right hip: Secondary | ICD-10-CM | POA: Diagnosis not present

## 2019-04-27 DIAGNOSIS — M545 Low back pain, unspecified: Secondary | ICD-10-CM

## 2019-04-27 DIAGNOSIS — G8929 Other chronic pain: Secondary | ICD-10-CM

## 2019-04-27 NOTE — Therapy (Signed)
[place] [facility] [address] [place], [place], [address] Phone: [phone]   Fax:  [phone]  Physical Therapy Treatment  Patient Details  Name: Leah Phillips MRN: [id number] Date of Birth: 06-02-1971 Referring Provider (PT): Dr. Sherryll BurgerShah   Encounter Date: 04/27/2019  PT End of Session - 04/27/19 1555    Visit Number  5    Number of Visits  12    Date for PT Re-Evaluation  05/17/19   Minireassess 04/26/19   Authorization Type  [employer]    Authorization Time Period  04/05/2019 - 05/17/2019    Authorization - Visit Number  5    Authorization - Number of Visits  30    PT Start Time  1540   pt late for apt   PT Stop Time  1616    PT Time Calculation (min)  36 min    Activity Tolerance  Patient tolerated treatment well;Patient limited by pain;No increased pain    Behavior During Therapy  WFL for tasks assessed/performed       Past Medical History:  Diagnosis Date  . Angio-edema   . Carpal tunnel syndrome   . Urticaria     Past Surgical History:  Procedure Laterality Date  . ANTERIOR CRUCIATE LIGAMENT REPAIR Left   . CARPAL TUNNEL RELEASE Right 04/01/2015   Procedure: Right Carpal Tunnel Release;  Surgeon: Eldred MangesMark C Yates, MD;  Location: [facility];  Service: Orthopedics;  Laterality: Right;  . CHOLECYSTECTOMY    . EXCISION METACARPAL MASS Right 04/01/2015   Procedure: Excision Right Long Finger Mass;  Surgeon: Eldred MangesMark C Yates, MD;  Location: [facility];  Service: Orthopedics;  Laterality: Right;    There were no vitals filed for this visit.  Subjective Assessment - 04/27/19 1542    Subjective  Pt stated the tennis ball helps with lower back pain.  Current pain scale 6/10 Rt groin, LBP and pain wiht Lt knee feels due to weather.    Limitations  Sitting;Lifting;House hold activities    Patient Stated Goals  Decreased pain    Currently in Pain?  Yes    Pain Score  6     Pain Location   Back    Pain Orientation  Lower;Right    Pain Descriptors / Indicators  Sore;Burning    Pain Type  Chronic pain    Pain Onset  More than a month ago    Pain Frequency  Intermittent    Aggravating Factors   sweeping, vaccuming    Pain Relieving Factors  tennis ball self massage, warm shower, MHP    Effect of Pain on Daily Activities  limits         [facility] PT Assessment - 04/27/19 0001      Assessment   Medical Diagnosis  Right hip pain    Referring Provider (PT)  Dr. Sherryll BurgerShah    Onset Date/Surgical Date  --   2-3 months ago   Hand Dominance  Right    Next MD Visit  05/17/19    Prior Therapy  Yes for ankle      Precautions   Precautions  None                   [facility] Adult PT Treatment/Exercise - 04/27/19 0001      Lumbar Exercises: Supine   Ab Set  20 reps    Pelvic Tilt  10 reps    Pelvic Tilt Limitations  tactile  cueing for mechanics    Bridge  10 reps    Bridge Limitations  3x10 with 2 second hold      Lumbar Exercises: Sidelying   Clam  Both;10 reps;3 seconds    Clam Limitations  IR and ER      Manual Therapy   Manual Therapy  Soft tissue mobilization    Manual therapy comments  separate from the rest of treatment    Soft tissue mobilization  gentle on R lumbar paraspinals, Self STM with tennis ball in standing at wall on lumbar paraspinals               PT Short Term Goals - 04/27/19 1726      PT SHORT TERM GOAL #1   Title  Patient will be independent with HEP in order to optimize outcomes    Baseline  04/27/19:  Reports compliance wiht ab set and heel slide HEP as well as self massage with tennis ball    Status  Achieved      PT SHORT TERM GOAL #2   Title  Patient will report that pain will be no greater than 3/10 indicating improved tolerance to physical activity.    Baseline  04/27/19:  Pt reports pain scale average 6/10.    Status  On-going        PT Long Term Goals - 04/05/19 1424      PT LONG TERM GOAL #1   Title  Patient will  deny pain at the end of 2 MWT to indicate improved tolerance to daily mobility.    Time  6    Period  Weeks    Status  New    Target Date  05/17/19      PT LONG TERM GOAL #2   Title  Patient will be able to resume all ADL unrestriced for full return to daily acitities.    Time  6    Period  Weeks    Status  New    Target Date  05/17/19      PT LONG TERM GOAL #3   Title  Patient will perform R hip MMT 5/5 in all planes without symptoms to demonstrate improved strength for daily activity.    Time  6    Period  Weeks    Status  New    Target Date  05/17/19            Plan - 04/27/19 1629    Clinical Impression Statement  Pt reports good tolerance and compliance with HEP and self massage for LBP.  Pt continues to be limited by pain in Rt groin area and LBP, reports average pain scale 6/10 and range from 4-8/10.  Added core and hip exercises for strengthening.  Pt with difficulty completing a posterior pelvic tilt required verbal and tactile cueing.  EOS with manual soft tissue massage to address restrictions for pain control.    Personal Factors and Comorbidities  Past/Current Experience;Behavior Pattern;Comorbidity 1    Comorbidities  Hypertension    Examination-Activity Limitations  Bed Mobility;Bend;Squat;Stairs;Transfers;Sit;Sleep    Examination-Participation Restrictions  Yard Work    Stability/Clinical Decision Making  Evolving/Moderate complexity    Clinical Decision Making  Moderate    Rehab Potential  Good    PT Frequency  2x / week    PT Duration  6 weeks    PT Treatment/Interventions  ADLs/Self Care Home Management;Electrical Stimulation;Moist Heat;Traction;DME Instruction;Gait training;Stair training;Functional mobility training;Therapeutic activities;Therapeutic exercise;Balance training;Neuromuscular re-education;Patient/family education;Orthotic Fit/Training;Manual techniques;Passive range of  motion;Dry needling;Energy conservation;Taping;Spinal Manipulations;Joint  Manipulations    PT Next Visit Plan  assess response to manual therapy,  progress  core strength/hip strenthening    PT Home Exercise Plan  04/12/19: gentle self STM of lumbar spine11/6/20: STM with ball of lumbar spine       Patient will benefit from skilled therapeutic intervention in order to improve the following deficits and impairments:  Decreased activity tolerance, Decreased endurance, Decreased mobility, Decreased strength, Difficulty walking, Impaired flexibility, Increased muscle spasms, Pain  Visit Diagnosis: Pain in right hip  Chronic low back pain, unspecified back pain laterality, unspecified whether sciatica present     Problem List Patient Active Problem List   Diagnosis Date Noted  . Seasonal and perennial allergic rhinitis 02/15/2019  . Allergic urticaria 02/15/2019  . Partial tear of left Achilles tendon 08/25/2018  . Bunion of great toe of right foot 09/02/2017  . Ganglion cyst of finger of right hand 02/04/2017  . Acute pain of left knee 01/14/2017  . Right elbow pain 01/14/2017  . Pes anserinus bursitis 02/15/2014  . PRURITUS 10/15/2009  . FATIGUE 07/23/2009  . SHOULDER PAIN, RIGHT 06/21/2009  . NECK PAIN 05/06/2009  . BACK PAIN 02/21/2009  . HYPERTENSION 10/02/2008  . KNEE PAIN, RIGHT 09/27/2008  . FOLLICULITIS 07/29/2008  . UNSPECIFIED VAGINITIS AND VULVOVAGINITIS 01/04/2008  . 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 OBESITY, UNSPECIFIED 11/11/2007  . ALLERGIC RHINITIS, SEASONAL 11/11/2007  . A C L SPRAIN-CHRONIC 06/23/2007  . TEAR A C L 06/23/2007   Becky Sax, LPTA; CBIS [phone]  Juel Burrow 04/27/2019, 5:33 PM  [place] [facility] [address] [place], [place], [address] Phone: [phone]   Fax:  [phone]  Name: Leah Phillips MRN: [id number] Date of Birth: 08/17/1970

## 2019-04-28 ENCOUNTER — Encounter (HOSPITAL_COMMUNITY): Payer: Self-pay | Admitting: Physical Therapy

## 2019-04-28 ENCOUNTER — Ambulatory Visit (HOSPITAL_COMMUNITY): Payer: PRIVATE HEALTH INSURANCE | Admitting: Physical Therapy

## 2019-04-28 DIAGNOSIS — M25551 Pain in right hip: Secondary | ICD-10-CM | POA: Diagnosis not present

## 2019-04-28 DIAGNOSIS — G8929 Other chronic pain: Secondary | ICD-10-CM

## 2019-04-28 NOTE — Therapy (Signed)
[place], [place], [address] Phone: [phone]   Fax:  [phone]  Physical Therapy Treatment  Patient Details  Name: Leah Phillips MRN: [id number] Date of Birth: [birth year] Referring Provider (PT): Dr. Manuella Ghazi   Encounter Date: 04/28/2019  PT End of Session - 04/28/19 1646    Visit Number  6    Number of Visits  12    Date for PT Re-Evaluation  05/17/19   Minireassess 04/26/19   Authorization Type  [employer] [place] Time Period  04/05/2019 - 05/17/2019    Authorization - Visit Number  6    Authorization - Number of Visits  30    PT Start Time  6962    PT Stop Time  9528    PT Time Calculation (min)  48 min    Activity Tolerance  Patient tolerated treatment well;Patient limited by pain;No increased pain    Behavior During Therapy  WFL for tasks assessed/performed       Past Medical History:  Diagnosis Date  . Angio-edema   . Carpal tunnel syndrome   . Urticaria     Past Surgical History:  Procedure Laterality Date  . ANTERIOR CRUCIATE LIGAMENT REPAIR Left   . CARPAL TUNNEL RELEASE Right 04/01/2015   Procedure: Right Carpal Tunnel Release;  Surgeon: Marybelle Killings, MD;  Location: [place];  Service: Orthopedics;  Laterality: Right;  . CHOLECYSTECTOMY    . EXCISION METACARPAL MASS Right 04/01/2015   Procedure: Excision Right Long Finger Mass;  Surgeon: Marybelle Killings, MD;  Location: [place];  Service: Orthopedics;  Laterality: Right;    There were no vitals filed for this visit.  Subjective Assessment - 04/28/19 1553    Subjective  Patient reports she has been good and bad lately. She did alright with her pain on the plane. She tried CBD oil cream on her back and it helped her pain. She then went to lift up a door and hurt her back. Shortly after her pain got better. Another day she was sitting and started to have pain in her low back and  radiating pain down her L leg which decreased after 2-3 hours. She has had a couple flare ups over last several days and felt good after yesterday's session. She has pain and burning in her low back.    Limitations  Sitting;Lifting;House hold activities    Patient Stated Goals  Decreased pain    Pain Score  5     Pain Onset  More than a month ago                       [facility] Adult PT Treatment/Exercise - 04/28/19 0001      Lumbar Exercises: Supine   Bridge  10 reps    Bridge Limitations  3x10 with 2 second hold      Manual Therapy   Manual Therapy  Soft tissue mobilization;Joint mobilization    Manual therapy comments  separate from the rest of treatment    Joint Mobilization  GradeII-III L UPA L4-L5, Grade II-III PA L PSIS    Soft tissue mobilization  gentle on R lumbar paraspinals, deep pressure to R hip flexors             PT Education - 04/28/19 1646    Education Details  patient educated on importance of completing HEP    Person(s) Educated  Patient    Methods  Explanation;Handout    Comprehension  Verbalized understanding       PT Short Term Goals - 04/27/19 1726      PT SHORT TERM GOAL #1   Title  Patient will be independent with HEP in order to optimize outcomes    Baseline  04/27/19:  Reports compliance wiht ab set and heel slide HEP as well as self massage with tennis ball    Status  Achieved      PT SHORT TERM GOAL #2   Title  Patient will report that pain will be no greater than 3/10 indicating improved tolerance to physical activity.    Baseline  04/27/19:  Pt reports pain scale average 6/10.    Status  On-going        PT Long Term Goals - 04/05/19 1424      PT LONG TERM GOAL #1   Title  Patient will deny pain at the end of 2 MWT to indicate improved tolerance to daily mobility.    Time  6    Period  Weeks    Status  New    Target Date  05/17/19      PT LONG TERM GOAL #2   Title  Patient will be able to resume all ADL unrestriced  for full return to daily acitities.    Time  6    Period  Weeks    Status  New    Target Date  05/17/19      PT LONG TERM GOAL #3   Title  Patient will perform R hip MMT 5/5 in all planes without symptoms to demonstrate improved strength for daily activity.    Time  6    Period  Weeks    Status  New    Target Date  05/17/19            Plan - 04/28/19 1652    Clinical Impression Statement  Patient has no change in symptoms following manual therapy intervention today. Patient symptoms continue to remain vague at times making it difficult to determine if symptoms are being caused by low back/SIJ/hip. Patient tolerates more pressure with manual therapy and the painful area around lumbar spine has decreased in size. Patient will continue to work on hip exercises over the weekend to see if hip pain begins to improve further. Patient will continue to benefit from skilled physical therapy in order to reduce impairment and improve function.    Personal Factors and Comorbidities  Past/Current Experience;Behavior Pattern;Comorbidity 1    Comorbidities  Hypertension    Examination-Activity Limitations  Bed Mobility;Bend;Squat;Stairs;Transfers;Sit;Sleep    Examination-Participation Restrictions  Yard Work    Conservation officer, historic buildings  Evolving/Moderate complexity    Rehab Potential  Good    PT Frequency  2x / week    PT Duration  6 weeks    PT Treatment/Interventions  ADLs/Self Care Home Management;Electrical Stimulation;Moist Heat;Traction;DME Instruction;Gait training;Stair training;Functional mobility training;Therapeutic activities;Therapeutic exercise;Balance training;Neuromuscular re-education;Patient/family education;Orthotic Fit/Training;Manual techniques;Passive range of motion;Dry needling;Energy conservation;Taping;Spinal Manipulations;Joint Manipulations    PT Next Visit Plan  assess response to manual therapy,  progress  core strength/hip strenthening    PT Home Exercise Plan   04/12/19: gentle self STM of lumbar spine11/6/20: STM with ball of lumbar spine 04/28/19 clams 1x10, reverse clams 1x10 bridges 3x10       Patient will benefit from skilled therapeutic intervention in order to improve the following deficits and impairments:  Decreased activity tolerance, Decreased endurance, Decreased  mobility, Decreased strength, Difficulty walking, Impaired flexibility, Increased muscle spasms, Pain  Visit Diagnosis: Pain in right hip  Chronic low back pain, unspecified back pain laterality, unspecified whether sciatica present     Problem List Patient Active Problem List   Diagnosis Date Noted  . Seasonal and perennial allergic rhinitis 02/15/2019  . Allergic urticaria 02/15/2019  . Partial tear of left Achilles tendon 08/25/2018  . Bunion of great toe of right foot 09/02/2017  . Ganglion cyst of finger of right hand 02/04/2017  . Acute pain of left knee 01/14/2017  . Right elbow pain 01/14/2017  . Pes anserinus bursitis 02/15/2014  . PRURITUS 10/15/2009  . FATIGUE 07/23/2009  . SHOULDER PAIN, RIGHT 06/21/2009  . NECK PAIN 05/06/2009  . BACK PAIN 02/21/2009  . HYPERTENSION 10/02/2008  . KNEE PAIN, RIGHT 09/27/2008  . FOLLICULITIS 07/29/2008  . UNSPECIFIED VAGINITIS AND VULVOVAGINITIS 01/04/2008  . 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 OBESITY, UNSPECIFIED 11/11/2007  . ALLERGIC RHINITIS, SEASONAL 11/11/2007  . A C L SPRAIN-CHRONIC 06/23/2007  . TEAR A C L 06/23/2007    4:54 PM, 04/28/19 Wyman SongsterAndrew S. Deniece Rankin PT, DPT Physical Therapist at [facility]  [facility] [address] [place], [place], [address] Phone: [phone]   Fax:  [phone]  Name: Leah Phillips MRN: [id number] Date of Birth: July 21, 1970

## 2019-04-28 NOTE — Patient Instructions (Signed)
Access Code: [email or web address]  URL: [email or web address]  Date: 04/28/2019  Prepared by: Mitzi Hansen Tishia Maestre   Exercises Supine Bridge - 10 reps - 3 sets - 1x daily - 7x weekly Clamshell - 10 reps - 1 sets - 1x daily - 7x weekly Sidelying Reverse Clamshell - 10 reps - 1 sets - 1x daily - 7x weekly

## 2019-05-01 ENCOUNTER — Other Ambulatory Visit: Payer: Self-pay

## 2019-05-01 ENCOUNTER — Encounter (HOSPITAL_COMMUNITY): Payer: Self-pay | Admitting: Physical Therapy

## 2019-05-01 ENCOUNTER — Ambulatory Visit (HOSPITAL_COMMUNITY): Payer: PRIVATE HEALTH INSURANCE | Admitting: Physical Therapy

## 2019-05-01 DIAGNOSIS — M545 Low back pain, unspecified: Secondary | ICD-10-CM

## 2019-05-01 DIAGNOSIS — M25551 Pain in right hip: Secondary | ICD-10-CM | POA: Diagnosis not present

## 2019-05-01 DIAGNOSIS — G8929 Other chronic pain: Secondary | ICD-10-CM

## 2019-05-01 NOTE — Therapy (Signed)
[place], [place], [address] Phone: [phone]   Fax:  [phone]  Physical Therapy Treatment  Patient Details  Name: Leah Phillips MRN: [id number] Date of Birth: 05-Dec-1970 Referring Provider (PT): Dr. Manuella Ghazi   Encounter Date: 05/01/2019  PT End of Session - 05/01/19 1522    Visit Number  7    Number of Visits  12    Date for PT Re-Evaluation  05/17/19   Minireassess 04/26/19   Authorization Type  [employer] [place] Time Period  04/05/2019 - 05/17/2019    Authorization - Visit Number  7    Authorization - Number of Visits  30    PT Start Time  9794    PT Stop Time  1602    PT Time Calculation (min)  45 min    Activity Tolerance  Patient tolerated treatment well;No increased pain    Behavior During Therapy  WFL for tasks assessed/performed       Past Medical History:  Diagnosis Date  . Angio-edema   . Carpal tunnel syndrome   . Urticaria     Past Surgical History:  Procedure Laterality Date  . ANTERIOR CRUCIATE LIGAMENT REPAIR Left   . CARPAL TUNNEL RELEASE Right 04/01/2015   Procedure: Right Carpal Tunnel Release;  Surgeon: Marybelle Killings, MD;  Location: [place];  Service: Orthopedics;  Laterality: Right;  . CHOLECYSTECTOMY    . EXCISION METACARPAL MASS Right 04/01/2015   Procedure: Excision Right Long Finger Mass;  Surgeon: Marybelle Killings, MD;  Location: [place];  Service: Orthopedics;  Laterality: Right;    There were no vitals filed for this visit.  Subjective Assessment - 05/01/19 1522    Subjective  Patient says exercise is going well, and that she has not had any new hip pain since starting "new exercise" last session. Patient says lumbar pain is still there, current burning sensation.    Limitations  Sitting;Lifting;House hold activities    Patient Stated Goals  Decreased pain    Currently in Pain?  Yes    Pain Score  5     Pain  Location  Back    Pain Orientation  Posterior;Right;Left    Pain Descriptors / Indicators  Burning    Pain Onset  More than a month ago                       [facility] Adult PT Treatment/Exercise - 05/01/19 0001      Lumbar Exercises: Stretches   Other Lumbar Stretch Exercise  Modified [place]gart stretch; RT; 3 x 30"      Lumbar Exercises: Supine   Bridge  10 reps      Lumbar Exercises: Sidelying   Clam  Both;3 seconds;20 reps    Clam Limitations  IR and ER      Manual Therapy   Manual Therapy  Soft tissue mobilization;Muscle Energy Technique    Manual therapy comments  separate from the rest of treatment    Soft tissue mobilization  IASTM to bilateral low lumbar paraspinals in prrone position     Muscle Energy Technique  Shotgun MET; 5 x5" abd with belt; 5x5" adduction into foam roll               PT Short Term Goals - 04/27/19 1726      PT SHORT TERM GOAL #1   Title  Patient will  be independent with HEP in order to optimize outcomes    Baseline  04/27/19:  Reports compliance wiht ab set and heel slide HEP as well as self massage with tennis ball    Status  Achieved      PT SHORT TERM GOAL #2   Title  Patient will report that pain will be no greater than 3/10 indicating improved tolerance to physical activity.    Baseline  04/27/19:  Pt reports pain scale average 6/10.    Status  On-going        PT Long Term Goals - 04/05/19 1424      PT LONG TERM GOAL #1   Title  Patient will deny pain at the end of 2 MWT to indicate improved tolerance to daily mobility.    Time  6    Period  Weeks    Status  New    Target Date  05/17/19      PT LONG TERM GOAL #2   Title  Patient will be able to resume all ADL unrestriced for full return to daily acitities.    Time  6    Period  Weeks    Status  New    Target Date  05/17/19      PT LONG TERM GOAL #3   Title  Patient will perform R hip MMT 5/5 in all planes without symptoms to demonstrate improved strength  for daily activity.    Time  6    Period  Weeks    Status  New    Target Date  05/17/19            Plan - 05/01/19 1605    Clinical Impression Statement  Patient tolerated treatment well today. Added IASTM to lumbar parapspinals to address tissue mobility restricitons and reduce pain. Patient educated on purpose and funciton of added MET shotgun technique to SIJ stabilization. Reviewed patient HEP. Patient performed all exercises in a slow and controlled manner. Patient cued on proper foot and hip placement with added modified [place] stretching. Patient reports decreased pain in lumbar to 4/10 post treatment.    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 complexity    Rehab Potential  Good    PT Frequency  2x / week    PT Duration  6 weeks    PT Treatment/Interventions  ADLs/Self Care Home Management;Electrical Stimulation;Moist Heat;Traction;DME Instruction;Gait training;Stair training;Functional mobility training;Therapeutic activities;Therapeutic exercise;Balance training;Neuromuscular re-education;Patient/family education;Orthotic Fit/Training;Manual techniques;Passive range of motion;Dry needling;Energy conservation;Taping;Spinal Manipulations;Joint Manipulations    PT Next Visit Plan  Continue to progress hip and core strength as tolerated.    PT Home Exercise Plan  04/12/19: gentle self STM of lumbar spine11/6/20: STM with ball of lumbar spine 04/28/19 clams 1x10, reverse clams 1x10 bridges 3x10       Patient will benefit from skilled therapeutic intervention in order to improve the following deficits and impairments:  Decreased activity tolerance, Decreased endurance, Decreased mobility, Decreased strength,  Difficulty walking, Impaired flexibility, Increased muscle spasms, Pain  Visit Diagnosis: Pain in right hip  Chronic low back pain, unspecified back pain laterality, unspecified whether sciatica present     Problem List Patient Active Problem List   Diagnosis Date Noted  . Seasonal and perennial allergic rhinitis 02/15/2019  . Allergic urticaria 02/15/2019  . Partial tear of  left Achilles tendon 08/25/2018  . Bunion of great toe of right foot 09/02/2017  . Ganglion cyst of finger of right hand 02/04/2017  . Acute pain of left knee 01/14/2017  . Right elbow pain 01/14/2017  . Pes anserinus bursitis 02/15/2014  . PRURITUS 10/15/2009  . FATIGUE 07/23/2009  . SHOULDER PAIN, RIGHT 06/21/2009  . NECK PAIN 05/06/2009  . BACK PAIN 02/21/2009  . HYPERTENSION 10/02/2008  . KNEE PAIN, RIGHT 09/27/2008  . FOLLICULITIS 36/14/4315  . UNSPECIFIED VAGINITIS AND VULVOVAGINITIS 01/04/2008  . 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 OBESITY, UNSPECIFIED 11/11/2007  . ALLERGIC RHINITIS, SEASONAL 11/11/2007  . A C L SPRAIN-CHRONIC 06/23/2007  . TEAR A C L 06/23/2007    4:10 PM, 05/01/19 Josue Hector PT DPT  Physical Therapist with [facility]  [phone] [place] [address] [place], [place], [address] Phone: [phone]   Fax:  [phone]  Name: CHERYLANNE ARDELEAN MRN: [id number] Date of Birth: Jan 04, 1971

## 2019-05-03 ENCOUNTER — Other Ambulatory Visit: Payer: Self-pay

## 2019-05-03 ENCOUNTER — Ambulatory Visit (HOSPITAL_COMMUNITY): Payer: PRIVATE HEALTH INSURANCE | Admitting: Physical Therapy

## 2019-05-03 ENCOUNTER — Encounter (HOSPITAL_COMMUNITY): Payer: Self-pay | Admitting: Physical Therapy

## 2019-05-03 DIAGNOSIS — M25551 Pain in right hip: Secondary | ICD-10-CM

## 2019-05-03 DIAGNOSIS — G8929 Other chronic pain: Secondary | ICD-10-CM

## 2019-05-03 DIAGNOSIS — M545 Low back pain, unspecified: Secondary | ICD-10-CM

## 2019-05-03 NOTE — Therapy (Signed)
[place], [place], [address] Phone: [phone]   Fax:  [phone]  Physical Therapy Treatment  Patient Details  Name: Leah Phillips MRN: [id number] Date of Birth: 01/21/1971 Referring Provider (PT): Dr. Manuella Ghazi   Encounter Date: 05/03/2019  PT End of Session - 05/03/19 1546    Visit Number  8    Number of Visits  12    Date for PT Re-Evaluation  05/17/19   Minireassess 04/26/19   Authorization Type  [employer] [place] Time Period  04/05/2019 - 05/17/2019    Authorization - Visit Number  8    Authorization - Number of Visits  30    PT Start Time  6761    PT Stop Time  1520    PT Time Calculation (min)  45 min    Activity Tolerance  Patient tolerated treatment well;No increased pain    Behavior During Therapy  WFL for tasks assessed/performed       Past Medical History:  Diagnosis Date  . Angio-edema   . Carpal tunnel syndrome   . Urticaria     Past Surgical History:  Procedure Laterality Date  . ANTERIOR CRUCIATE LIGAMENT REPAIR Left   . CARPAL TUNNEL RELEASE Right 04/01/2015   Procedure: Right Carpal Tunnel Release;  Surgeon: Marybelle Killings, MD;  Location: [place];  Service: Orthopedics;  Laterality: Right;  . CHOLECYSTECTOMY    . EXCISION METACARPAL MASS Right 04/01/2015   Procedure: Excision Right Long Finger Mass;  Surgeon: Marybelle Killings, MD;  Location: [place];  Service: Orthopedics;  Laterality: Right;    There were no vitals filed for this visit.  Subjective Assessment - 05/03/19 1448    Subjective  Patient reported that she has some pain which she rates as a 4/10 in her back and hip. She said she feels like the modified Sabic test stretch helped her pain some. Patient reported pain when abducting her leg.    Limitations  Sitting;Lifting;House hold activities    Patient Stated Goals  Decreased pain    Currently in Pain?  Yes    Pain  Score  4     Pain Location  Back   and hip   Pain Orientation  Right    Pain Descriptors / Indicators  Burning    Pain Type  Chronic pain    Pain Onset  More than a month ago         [facility] PT Assessment - 05/03/19 0001      Palpation   Palpation comment  With passive hip flexor stretching patient reported an increase in tingling down front of leg. Resisted hip adduction and hip flexion, recreated some hip groin pain. Tenderness to palpation of greater trochanter.                   [facility] Adult PT Treatment/Exercise - 05/03/19 0001      Lumbar Exercises: Stretches   Other Lumbar Stretch Exercise  Modified [place] stretch; RT; 3 x 30"      Lumbar Exercises: Supine   Bridge  10 reps      Manual Therapy   Manual Therapy  Soft tissue mobilization;Muscle Energy Technique;Neural Stretch;Passive ROM    Manual therapy comments  separate from the rest of treatment    Soft tissue mobilization  To right gluteals and musculature attaching to greater trochanter to patient's tolerance    Passive ROM  Passive right hip flexor stretch -increased tingingling numbness down anterior thigh             PT Education - 05/03/19 1544    Education Details  Educated on proximal hip anatomy nerves, muscles, and ligaments that may be contributing to patient's current symptoms.    Person(s) Educated  Patient    Methods  Explanation    Comprehension  Verbalized understanding       PT Short Term Goals - 04/27/19 1726      PT SHORT TERM GOAL #1   Title  Patient will be independent with HEP in order to optimize outcomes    Baseline  04/27/19:  Reports compliance wiht ab set and heel slide HEP as well as self massage with tennis ball    Status  Achieved      PT SHORT TERM GOAL #2   Title  Patient will report that pain will be no greater than 3/10 indicating improved tolerance to physical activity.    Baseline  04/27/19:  Pt reports pain scale average 6/10.    Status  On-going         PT Long Term Goals - 04/05/19 1424      PT LONG TERM GOAL #1   Title  Patient will deny pain at the end of 2 MWT to indicate improved tolerance to daily mobility.    Time  6    Period  Weeks    Status  New    Target Date  05/17/19      PT LONG TERM GOAL #2   Title  Patient will be able to resume all ADL unrestriced for full return to daily acitities.    Time  6    Period  Weeks    Status  New    Target Date  05/17/19      PT LONG TERM GOAL #3   Title  Patient will perform R hip MMT 5/5 in all planes without symptoms to demonstrate improved strength for daily activity.    Time  6    Period  Weeks    Status  New    Target Date  05/17/19            Plan - 05/03/19 1555    Clinical Impression Statement  Patient reported low back pain and groin pain this session. With palpation patient reported increase in symptoms with resisted combined hip flexion and adduction as well as tenderness to palpation to greater trochanter. This session focused manual therapy on gluteals and musculature attaching to greater trochanter. With passive hip flexor stretch patient reported increased tingling down anterior thigh. Plan to investigate this further and trial neural tensioning and stretching.    Personal Factors and Comorbidities  Past/Current Experience;Behavior Pattern;Comorbidity 1    Comorbidities  Hypertension    Examination-Activity Limitations  Bed Mobility;Bend;Squat;Stairs;Transfers;Sit;Sleep    Examination-Participation Restrictions  Yard Work    Conservation officer, historic buildings  Evolving/Moderate complexity    Rehab Potential  Good    PT Frequency  2x / week    PT Duration  6 weeks    PT Treatment/Interventions  ADLs/Self Care Home Management;Electrical Stimulation;Moist Heat;Traction;DME Instruction;Gait training;Stair training;Functional mobility training;Therapeutic activities;Therapeutic exercise;Balance training;Neuromuscular re-education;Patient/family  education;Orthotic Fit/Training;Manual techniques;Passive range of motion;Dry needling;Energy conservation;Taping;Spinal Manipulations;Joint Manipulations    PT Next Visit Plan  Trial right femoral nerve glides, trial sidebending stretch of the right side (leaning toward left)    PT Home Exercise Plan  04/12/19: gentle self STM of lumbar spine11/6/20:  STM with ball of lumbar spine 04/28/19 clams 1x10, reverse clams 1x10 bridges 3x10       Patient will benefit from skilled therapeutic intervention in order to improve the following deficits and impairments:  Decreased activity tolerance, Decreased endurance, Decreased mobility, Decreased strength, Difficulty walking, Impaired flexibility, Increased muscle spasms, Pain  Visit Diagnosis: Pain in right hip  Chronic low back pain, unspecified back pain laterality, unspecified whether sciatica present     Problem List Patient Active Problem List   Diagnosis Date Noted  . Seasonal and perennial allergic rhinitis 02/15/2019  . Allergic urticaria 02/15/2019  . Partial tear of left Achilles tendon 08/25/2018  . Bunion of great toe of right foot 09/02/2017  . Ganglion cyst of finger of right hand 02/04/2017  . Acute pain of left knee 01/14/2017  . Right elbow pain 01/14/2017  . Pes anserinus bursitis 02/15/2014  . PRURITUS 10/15/2009  . FATIGUE 07/23/2009  . SHOULDER PAIN, RIGHT 06/21/2009  . NECK PAIN 05/06/2009  . BACK PAIN 02/21/2009  . HYPERTENSION 10/02/2008  . KNEE PAIN, RIGHT 09/27/2008  . FOLLICULITIS 07/29/2008  . UNSPECIFIED VAGINITIS AND VULVOVAGINITIS 01/04/2008  . 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 OBESITY, UNSPECIFIED 11/11/2007  . ALLERGIC RHINITIS, SEASONAL 11/11/2007  . A C L SPRAIN-CHRONIC 06/23/2007  . TEAR A C L 06/23/2007   Verne CarrowMacy Donique Hammonds PT, DPT 3:58 PM, 05/03/19 [phone]  [facility] [address] [place], [place], [address] Phone: [phone]   Fax:   [phone]  Name: Donah Driverasha S Magnan MRN: [id number] Date of Birth: 07-31-1970

## 2019-05-09 ENCOUNTER — Ambulatory Visit (HOSPITAL_COMMUNITY): Payer: PRIVATE HEALTH INSURANCE | Attending: Internal Medicine | Admitting: Physical Therapy

## 2019-05-09 ENCOUNTER — Other Ambulatory Visit: Payer: Self-pay

## 2019-05-09 ENCOUNTER — Encounter (HOSPITAL_COMMUNITY): Payer: Self-pay | Admitting: Physical Therapy

## 2019-05-09 DIAGNOSIS — M545 Low back pain: Secondary | ICD-10-CM | POA: Insufficient documentation

## 2019-05-09 DIAGNOSIS — M25551 Pain in right hip: Secondary | ICD-10-CM | POA: Diagnosis present

## 2019-05-09 DIAGNOSIS — G8929 Other chronic pain: Secondary | ICD-10-CM | POA: Insufficient documentation

## 2019-05-09 NOTE — Therapy (Signed)
[place] [facility] [address] [place], [place], [address] Phone: [phone]   Fax:  [phone]  Physical Therapy Treatment  Patient Details  Name: Leah Phillips MRN: [id number] Date of Birth: [birth year] Referring Provider (PT): Dr. Sherryll BurgerShah   Encounter Date: 05/09/2019  PT End of Session - 05/09/19 1527    Visit Number  9    Number of Visits  12    Date for PT Re-Evaluation  05/17/19   Minireassess 04/26/19   Authorization Type  [employer] [facility]    Authorization Time Period  04/05/2019 - 05/17/2019    Authorization - Visit Number  9    Authorization - Number of Visits  30    PT Start Time  1530   patient arrived late   PT Stop Time  1615    PT Time Calculation (min)  45 min    Activity Tolerance  Patient limited by pain    Behavior During Therapy  [facility] for tasks assessed/performed       Past Medical History:  Diagnosis Date  . Angio-edema   . Carpal tunnel syndrome   . Urticaria     Past Surgical History:  Procedure Laterality Date  . ANTERIOR CRUCIATE LIGAMENT REPAIR Left   . CARPAL TUNNEL RELEASE Right 04/01/2015   Procedure: Right Carpal Tunnel Release;  Surgeon: Eldred MangesMark C Yates, MD;  Location: [facility];  Service: Orthopedics;  Laterality: Right;  . CHOLECYSTECTOMY    . EXCISION METACARPAL MASS Right 04/01/2015   Procedure: Excision Right Long Finger Mass;  Surgeon: Eldred MangesMark C Yates, MD;  Location: [facility];  Service: Orthopedics;  Laterality: Right;    There were no vitals filed for this visit.  Subjective Assessment - 05/09/19 1539    Subjective  Patient presents to therapy with complaint of increased RT hip pain. Patient says her hip has been more painful since last session. Patient says she had a tough time with walking and ambulating stairs over the weekend. Patient says she took medication and slept most of the weekend to help with pain. Patient says hip is not as painful today but is  still hurting.    Limitations  Sitting;Lifting;House hold activities    Patient Stated Goals  Decreased pain    Currently in Pain?  Yes    Pain Score  6     Pain Location  Hip    Pain Orientation  Right;Anterior    Pain Descriptors / Indicators  Aching;Sharp    Pain Type  Chronic pain    Pain Onset  More than a month ago    Pain Frequency  Constant                       [facility] Adult PT Treatment/Exercise - 05/09/19 0001      Lumbar Exercises: Stretches   Single Knee to Chest Stretch  Right;5 reps;10 seconds    Single Knee to Chest Stretch Limitations  pain free range      Lumbar Exercises: Supine   Heel Slides  10 reps   RLE   Bridge  10 reps    Other Supine Lumbar Exercises  Attempted SLR but with increased pain    Other Supine Lumbar Exercises  hip adduction with pillow 10 x 5"      Lumbar Exercises: Sidelying   Clam  Right;10 reps    Clam Limitations  Attempted IR, but increased pain  PT Education - 05/09/19 1620    Education Details  Patient educated on pacing activity and activity level, educated on updated HEP    Person(s) Educated  Patient    Methods  Explanation    Comprehension  Verbalized understanding       PT Short Term Goals - 04/27/19 1726      PT SHORT TERM GOAL #1   Title  Patient will be independent with HEP in order to optimize outcomes    Baseline  04/27/19:  Reports compliance wiht ab set and heel slide HEP as well as self massage with tennis ball    Status  Achieved      PT SHORT TERM GOAL #2   Title  Patient will report that pain will be no greater than 3/10 indicating improved tolerance to physical activity.    Baseline  04/27/19:  Pt reports pain scale average 6/10.    Status  On-going        PT Long Term Goals - 04/05/19 1424      PT LONG TERM GOAL #1   Title  Patient will deny pain at the end of 2 MWT to indicate improved tolerance to daily mobility.    Time  6    Period  Weeks    Status  New     Target Date  05/17/19      PT LONG TERM GOAL #2   Title  Patient will be able to resume all ADL unrestriced for full return to daily acitities.    Time  6    Period  Weeks    Status  New    Target Date  05/17/19      PT LONG TERM GOAL #3   Title  Patient will perform R hip MMT 5/5 in all planes without symptoms to demonstrate improved strength for daily activity.    Time  6    Period  Weeks    Status  New    Target Date  05/17/19            Plan - 05/09/19 1539    Clinical Impression Statement  Patient limited by pain with todays treatment. Patient noted increased discomfort form previous passive stretching, so manuals held this date. Grade I long axis distraction was performed on RT hip in open packed position to plausibly discern if hip arthritis may be playing a larger role in recent complaints of hip pain. Patient noted increased hip pain, apprehension, and feeling of hip instability. Activity was graded to patient tolerance and pain level was closely monitored this session. Patient noted feeling that "my hip needs to pop" when performing bridges. Patient also noted increased tingling in BLEs during hip abduction with pillow. Patient instructed to focus only on reviewed table exercises for gentle hip strength and mobility within limits of pain tolerance. Will assess response next visit.    Personal Factors and Comorbidities  Past/Current Experience;Behavior Pattern;Comorbidity 1    Comorbidities  Hypertension    Examination-Activity Limitations  Bed Mobility;Bend;Squat;Stairs;Transfers;Sit;Sleep    Examination-Participation Restrictions  Yard Work    Conservation officer, historic buildings  Evolving/Moderate complexity    Rehab Potential  Good    PT Frequency  2x / week    PT Duration  6 weeks    PT Treatment/Interventions  ADLs/Self Care Home Management;Electrical Stimulation;Moist Heat;Traction;DME Instruction;Gait training;Stair training;Functional mobility training;Therapeutic  activities;Therapeutic exercise;Balance training;Neuromuscular re-education;Patient/family education;Orthotic Fit/Training;Manual techniques;Passive range of motion;Dry needling;Energy conservation;Taping;Spinal Manipulations;Joint Manipulations    PT Next Visit Plan  Assess  response to updated HEP. Progress activity as toleraed. If symptoms remain unchanged, consider DC to return of referring provider.    PT Home Exercise Plan  04/12/19: gentle self STM of lumbar spine11/6/20: STM with ball of lumbar spine 04/28/19 clams 1x10, reverse clams 1x10 bridges 3x10; 05/09/19: bridge, heel slide, SKTC, clamshell, ball squeeze    Consulted and Agree with Plan of Care  Patient       Patient will benefit from skilled therapeutic intervention in order to improve the following deficits and impairments:  Decreased activity tolerance, Decreased endurance, Decreased mobility, Decreased strength, Difficulty walking, Impaired flexibility, Increased muscle spasms, Pain  Visit Diagnosis: Pain in right hip  Chronic low back pain, unspecified back pain laterality, unspecified whether sciatica present     Problem List Patient Active Problem List   Diagnosis Date Noted  . Seasonal and perennial allergic rhinitis 02/15/2019  . Allergic urticaria 02/15/2019  . Partial tear of left Achilles tendon 08/25/2018  . Bunion of great toe of right foot 09/02/2017  . Ganglion cyst of finger of right hand 02/04/2017  . Acute pain of left knee 01/14/2017  . Right elbow pain 01/14/2017  . Pes anserinus bursitis 02/15/2014  . PRURITUS 10/15/2009  . FATIGUE 07/23/2009  . SHOULDER PAIN, RIGHT 06/21/2009  . NECK PAIN 05/06/2009  . BACK PAIN 02/21/2009  . HYPERTENSION 10/02/2008  . KNEE PAIN, RIGHT 09/27/2008  . FOLLICULITIS 51/07/5850  . UNSPECIFIED VAGINITIS AND VULVOVAGINITIS 01/04/2008  . 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 OBESITY, UNSPECIFIED 11/11/2007  . ALLERGIC RHINITIS, SEASONAL 11/11/2007  . A C L  SPRAIN-CHRONIC 06/23/2007  . TEAR A C L 06/23/2007   4:36 PM, 05/09/19 Leah Phillips PT DPT  Physical Therapist with [facility]  [phone] [place] [address] [place], [place], [address] Phone: [phone]   Fax:  [phone]  Name: Leah Phillips MRN: [id number] Date of Birth: 08/25/1970

## 2019-05-10 ENCOUNTER — Ambulatory Visit (INDEPENDENT_AMBULATORY_CARE_PROVIDER_SITE_OTHER): Payer: PRIVATE HEALTH INSURANCE

## 2019-05-10 DIAGNOSIS — J309 Allergic rhinitis, unspecified: Secondary | ICD-10-CM | POA: Diagnosis not present

## 2019-05-11 ENCOUNTER — Ambulatory Visit (HOSPITAL_COMMUNITY): Payer: PRIVATE HEALTH INSURANCE | Admitting: Physical Therapy

## 2019-05-11 ENCOUNTER — Encounter (HOSPITAL_COMMUNITY): Payer: Self-pay | Admitting: Physical Therapy

## 2019-05-11 ENCOUNTER — Other Ambulatory Visit: Payer: Self-pay

## 2019-05-11 DIAGNOSIS — M545 Low back pain, unspecified: Secondary | ICD-10-CM

## 2019-05-11 DIAGNOSIS — G8929 Other chronic pain: Secondary | ICD-10-CM

## 2019-05-11 DIAGNOSIS — M25551 Pain in right hip: Secondary | ICD-10-CM | POA: Diagnosis not present

## 2019-05-11 NOTE — Therapy (Signed)
[place], [place], [address] Phone: [phone]   Fax:  [phone]  Physical Therapy Treatment/ Discharge Summary  Patient Details  Name: Leah Phillips MRN: [id number] Date of Birth: 09/18/1970 Referring Provider (PT): Dr. Manuella Ghazi   Encounter Date: 05/11/2019   PHYSICAL THERAPY DISCHARGE SUMMARY  Visits from Start of Care: 10  Current functional level related to goals / functional outcomes: See below   Remaining deficits: See below   Education / Equipment: See assessment Plan: Patient agrees to discharge.  Patient goals were not met. Patient is being discharged due to lack of progress.  ?????       PT End of Session - 05/11/19 1528    Visit Number  10    Number of Visits  12    Date for PT Re-Evaluation  05/17/19    Authorization Type  [employer] [place] Time Period  04/05/2019 - 05/17/2019    Authorization - Visit Number  10    Authorization - Number of Visits  30    PT Start Time  1520    PT Stop Time  1550    PT Time Calculation (min)  30 min    Activity Tolerance  Patient limited by pain       Past Medical History:  Diagnosis Date  . Angio-edema   . Carpal tunnel syndrome   . Urticaria     Past Surgical History:  Procedure Laterality Date  . ANTERIOR CRUCIATE LIGAMENT REPAIR Left   . CARPAL TUNNEL RELEASE Right 04/01/2015   Procedure: Right Carpal Tunnel Release;  Surgeon: Marybelle Killings, MD;  Location: [place];  Service: Orthopedics;  Laterality: Right;  . CHOLECYSTECTOMY    . EXCISION METACARPAL MASS Right 04/01/2015   Procedure: Excision Right Long Finger Mass;  Surgeon: Marybelle Killings, MD;  Location: [place];  Service: Orthopedics;  Laterality: Right;    There were no vitals filed for this visit.  Subjective Assessment - 05/11/19 1526    Subjective  Patient says she feels better than last visit, but still has 5/10 pain in  RT groin area. Patient says she has been doing her updated HEP, but has not been able to get pain lower than 5/10, although she notes that her lumbar pain has not increased. Patient says she still feels the same level of instability in RT hip, and that hip "wants to pop out" with walking and going up stairs.    Limitations  Sitting;Lifting;House hold activities    How long can you sit comfortably?  2 hours    How long can you stand comfortably?  Not limited    How long can you walk comfortably?  2-3 hours    Patient Stated Goals  Decreased pain    Currently in Pain?  Yes    Pain Score  5     Pain Location  Hip    Pain Orientation  Right;Anterior    Pain Descriptors / Indicators  Aching;Sharp    Pain Type  Chronic pain    Pain Onset  More than a month ago    Pain Frequency  Constant    Aggravating Factors   standing, walking    Pain Relieving Factors  medication    Effect of Pain on Daily Activities  limiting         [facility] PT Assessment - 05/11/19 0001      Assessment  Medical Diagnosis  Right hip pain    Referring Provider (PT)  Dr. Manuella Ghazi    Next MD Visit  05/18/19      Precautions   Precautions  None      Restrictions   Weight Bearing Restrictions  No      Balance Screen   Has the patient fallen in the past 6 months  No      [place] residence      Prior Function   Level of Independence  Independent      Cognition   Overall Cognitive Status  Within Functional Limits for tasks assessed      Observation/Other Assessments   Focus on Therapeutic Outcomes (FOTO)   47% limited   was 41%     PROM   Right Hip Extension  10   pain   Right Hip Flexion  70   pain   Left Hip Extension  10    Left Hip Flexion  124      Strength   Right Hip Flexion  4/5   was 4+/5   Right Hip Extension  4-/5   was 5/5   Right Hip ABduction  4/5   was 5/5   Left Hip Flexion  5/5    Left Hip Extension  5/5    Left Hip ABduction  5/5    Right  Knee Flexion  5/5    Right Knee Extension  5/5    Left Knee Flexion  5/5    Left Knee Extension  5/5    Right Ankle Dorsiflexion  5/5    Left Ankle Dorsiflexion  5/5                           PT Education - 05/11/19 1527    Education Details  Patient educated on reassessment findings and DC status    Person(s) Educated  Patient    Methods  Explanation    Comprehension  Verbalized understanding       PT Short Term Goals - 05/11/19 1559      PT SHORT TERM GOAL #1   Title  Patient will be independent with HEP in order to optimize outcomes    Baseline  04/27/19:  Reports compliance wiht ab set and heel slide HEP as well as self massage with tennis ball    Status  Achieved      PT SHORT TERM GOAL #2   Title  Patient will report that pain will be no greater than 3/10 indicating improved tolerance to physical activity.    Baseline  04/27/19:  Pt reports pain scale average 6/10; 05/11/19: 5/10    Status  Not Met        PT Long Term Goals - 05/11/19 1559      PT LONG TERM GOAL #1   Title  Patient will deny pain at the end of 2 MWT to indicate improved tolerance to daily mobility.    Time  6    Period  Weeks    Status  Not Met      PT LONG TERM GOAL #2   Title  Patient will be able to resume all ADL unrestriced for full return to daily acitities.    Time  6    Period  Weeks    Status  Not Met      PT LONG TERM GOAL #3   Title  Patient will  perform R hip MMT 5/5 in all planes without symptoms to demonstrate improved strength for daily activity.    Time  6    Period  Weeks    Status  Not Met            Plan - 05/11/19 1559    Clinical Impression Statement  Patient has not made significant progress to therapy LTG, and continues to be limited in ability to participate due to intractable RT hip pain. Patient reports decreased function per recent FOTO score, and showed decreased strength measures in RT hip with limitation due to pain. At this time,  patient is being discharged from therapy to return to care of referring provider for further consultation. Reviewed current HEP with patient and highlighted importance of adherence as means of maintaining current level of function/ tolerance to activity provided this does not increase pain level. Patient was agreeable with this plan. Patient discharged from therapy with LTGs not met. Patient instructed to follow up with therapy services with any further question or concerns.    Personal Factors and Comorbidities  Past/Current Experience;Behavior Pattern;Comorbidity 1    Comorbidities  Hypertension    Examination-Activity Limitations  Bed Mobility;Bend;Squat;Stairs;Transfers;Sit;Sleep    Examination-Participation Restrictions  Yard Work    Stability/Clinical Decision Making  Evolving/Moderate complexity    Rehab Potential  Good    PT Treatment/Interventions  ADLs/Self Care Home Management;Electrical Stimulation;Moist Heat;Traction;DME Instruction;Gait training;Stair training;Functional mobility training;Therapeutic activities;Therapeutic exercise;Balance training;Neuromuscular re-education;Patient/family education;Orthotic Fit/Training;Manual techniques;Passive range of motion;Dry needling;Energy conservation;Taping;Spinal Manipulations;Joint Manipulations    PT Next Visit Plan  DC    PT Home Exercise Plan  04/12/19: gentle self STM of lumbar spine11/6/20: STM with ball of lumbar spine 04/28/19 clams 1x10, reverse clams 1x10 bridges 3x10; 05/09/19: bridge, heel slide, SKTC, clamshell, ball squeeze    Consulted and Agree with Plan of Care  Patient       Patient will benefit from skilled therapeutic intervention in order to improve the following deficits and impairments:  Decreased activity tolerance, Decreased endurance, Decreased mobility, Decreased strength, Difficulty walking, Impaired flexibility, Increased muscle spasms, Pain  Visit Diagnosis: Pain in right hip  Chronic low back pain, unspecified  back pain laterality, unspecified whether sciatica present     Problem List Patient Active Problem List   Diagnosis Date Noted  . Seasonal and perennial allergic rhinitis 02/15/2019  . Allergic urticaria 02/15/2019  . Partial tear of left Achilles tendon 08/25/2018  . Bunion of great toe of right foot 09/02/2017  . Ganglion cyst of finger of right hand 02/04/2017  . Acute pain of left knee 01/14/2017  . Right elbow pain 01/14/2017  . Pes anserinus bursitis 02/15/2014  . PRURITUS 10/15/2009  . FATIGUE 07/23/2009  . SHOULDER PAIN, RIGHT 06/21/2009  . NECK PAIN 05/06/2009  . BACK PAIN 02/21/2009  . HYPERTENSION 10/02/2008  . KNEE PAIN, RIGHT 09/27/2008  . FOLLICULITIS 66/44/0347  . UNSPECIFIED VAGINITIS AND VULVOVAGINITIS 01/04/2008  . 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 OBESITY, UNSPECIFIED 11/11/2007  . ALLERGIC RHINITIS, SEASONAL 11/11/2007  . A C L SPRAIN-CHRONIC 06/23/2007  . TEAR A C L 06/23/2007   4:03 PM, 05/11/19 Josue Hector PT DPT  Physical Therapist with [facility]  [phone] [place] [address] [place], [place], [address] Phone: [phone]   Fax:  [phone]  Name: CAROLJEAN MONSIVAIS MRN: [id number] Date of Birth: September 18, 1970

## 2019-05-16 ENCOUNTER — Ambulatory Visit (HOSPITAL_COMMUNITY): Payer: PRIVATE HEALTH INSURANCE | Admitting: Physical Therapy

## 2019-05-17 ENCOUNTER — Ambulatory Visit (INDEPENDENT_AMBULATORY_CARE_PROVIDER_SITE_OTHER): Payer: PRIVATE HEALTH INSURANCE | Admitting: Allergy & Immunology

## 2019-05-17 ENCOUNTER — Encounter: Payer: Self-pay | Admitting: Allergy & Immunology

## 2019-05-17 ENCOUNTER — Ambulatory Visit: Payer: PRIVATE HEALTH INSURANCE | Admitting: Allergy & Immunology

## 2019-05-17 ENCOUNTER — Other Ambulatory Visit: Payer: Self-pay

## 2019-05-17 ENCOUNTER — Ambulatory Visit: Payer: Self-pay | Admitting: *Deleted

## 2019-05-17 VITALS — BP 122/86 | HR 72 | Temp 98.1°F | Resp 18

## 2019-05-17 DIAGNOSIS — J302 Other seasonal allergic rhinitis: Secondary | ICD-10-CM | POA: Diagnosis not present

## 2019-05-17 DIAGNOSIS — J3089 Other allergic rhinitis: Secondary | ICD-10-CM

## 2019-05-17 DIAGNOSIS — L500 Allergic urticaria: Secondary | ICD-10-CM | POA: Diagnosis not present

## 2019-05-17 DIAGNOSIS — J309 Allergic rhinitis, unspecified: Secondary | ICD-10-CM

## 2019-05-17 NOTE — Patient Instructions (Addendum)
1. Seasonal and perennial allergic rhinitis (grasses, ragweed, weeds, trees, indoor molds, outdoor molds, dust mites, cat and cockroach) - Continue with shots at the same schedule. - Continue taking: Allegra (fexofenadine) 180mg  table once AT NIGHT and Singulair (montelukast) 10mg  daily AT NIGHT - You can use an extra dose of the antihistamine, if needed, for breakthrough symptoms.  - Consider nasal saline rinses 1-2 times daily to remove allergens from the nasal cavities as well as help with mucous clearance (this is especially helpful to do before the nasal sprays are given)  2. Allergic urticaria  - We can defer on lab workup at this time. - Once the allergy shots are at a good dose, we can start taking off antihistamines.  - In the meantime, start suppressive dosing of antihistamines (we are not doing higher doses since you have):   - Evening: Allegra (fexofenadine) 180 mg table once AT NIGHT + Pepcid (famotidine) 20 mg AT NIGHT + Singulair (montelukast) 10 mg AT NIGHT  3. Return in about 6 months (around 11/15/2019). This can be an in-person, a virtual Webex or a telephone follow up visit.   Please inform us of any Emergency Department visits, hospitalizations, or changes in symptoms. Call us before going to the ED for breathing or allergy symptoms since we might be able to fit you in for a sick visit. Feel free to contact us anytime with any questions, problems, or concerns.  It was a pleasure to see you again today!  Websites that have reliable patient information: 1. American Academy of Asthma, Allergy, and Immunology: www.aaaai.org 2. Food Allergy Research and Education (FARE): foodallergy.org 3. Mothers of Asthmatics: http://www.asthmacommunitynetwork.org 4. American College of Allergy, Asthma, and Immunology: www.acaai.org  "Like" [place] on Facebook and Instagram for our latest updates!      Make sure you are registered to vote! If you have moved or changed any of your contact  information, you will need to get this updated before voting!  In some cases, you MAY be able to register to vote online: [email or web address]

## 2019-05-17 NOTE — Progress Notes (Signed)
FOLLOW UP  Date of Service/Encounter:  05/17/19   Assessment:   Seasonal and perennial allergic rhinitis (grasses, ragweed, weeds, trees, indoor molds, outdoor molds, dust mites, cat and cockroach) - on allergen immunotherapy  Allergic urticaria - will try to wean off antihistamines once allergy shots are closer to maintenance dosing  Plan/Recommendations:   1. Seasonal and perennial allergic rhinitis (grasses, ragweed, weeds, trees, indoor molds, outdoor molds, dust mites, cat and cockroach) - Continue with shots at the same schedule. - Continue taking: Allegra (fexofenadine) 180mg  table once AT NIGHT and Singulair (montelukast) 10mg  daily AT NIGHT - You can use an extra dose of the antihistamine, if needed, for breakthrough symptoms.  - Consider nasal saline rinses 1-2 times daily to remove allergens from the nasal cavities as well as help with mucous clearance (this is especially helpful to do before the nasal sprays are given)  2. Allergic urticaria  - We can defer on lab workup at this time. - Once the allergy shots are at a good dose, we can start taking off antihistamines.  - In the meantime, start suppressive dosing of antihistamines (we are not doing higher doses since you have):   - Evening: Allegra (fexofenadine) 180 mg table once AT NIGHT + Pepcid (famotidine) 20 mg AT NIGHT + Singulair (montelukast) 10 mg AT NIGHT  3. Return in about 6 months (around 11/15/2019). This can be an in-person, a virtual Webex or a telephone follow up visit.  Subjective:   Leah Phillips is a 48 y.o. female presenting today for follow up of  Chief Complaint  Patient presents with  . Allergies    Doing okay  . Urticaria    3 episodes since last visit     Leah Phillips has a history of the following: Patient Active Problem List   Diagnosis Date Noted  . Seasonal and perennial allergic rhinitis 02/15/2019  . Allergic urticaria 02/15/2019  . Partial tear of left Achilles tendon  08/25/2018  . Bunion of great toe of right foot 09/02/2017  . Ganglion cyst of finger of right hand 02/04/2017  . Acute pain of left knee 01/14/2017  . Right elbow pain 01/14/2017  . Pes anserinus bursitis 02/15/2014  . PRURITUS 10/15/2009  . FATIGUE 07/23/2009  . SHOULDER PAIN, RIGHT 06/21/2009  . NECK PAIN 05/06/2009  . BACK PAIN 02/21/2009  . HYPERTENSION 10/02/2008  . KNEE PAIN, RIGHT 09/27/2008  . FOLLICULITIS 07/29/2008  . UNSPECIFIED VAGINITIS AND VULVOVAGINITIS 01/04/2008  . 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 OBESITY, UNSPECIFIED 11/11/2007  . ALLERGIC RHINITIS, SEASONAL 11/11/2007  . A C L SPRAIN-CHRONIC 06/23/2007  . TEAR A C L 06/23/2007    History obtained from: chart review and patient.  Leah Phillips is a 48 y.o. female presenting for a follow up visit. She was last seen in September 2020 for a New Patient visit. At that time, we did testing that was positive to grasses, ragweed, weeds, trees, indoor molds, outdoor molds, dust mites, cat and cockroach. We started Allegra at night and Singulair at night. We did not do any nasal sprays since she was not a fan of them. She did decide to start allergy immunotherapy. She also was complaining of hives, but we did not do a lab workup since she had so many allergic triggers on her testing.    Since the last visit, she has done well. Allergy shots are being tolerated without adverse events. [name] having some urticaria but they are few and far between.  She has not had any anaphylactic reactions whatsoever. Overall she is happy with how she is doing.   Otherwise, there have been no changes to her past medical history, surgical history, family history, or social history.    Review of Systems  Constitutional: Negative.  Negative for chills, fever, malaise/fatigue and weight loss.  HENT: Negative.  Negative for congestion, ear discharge, ear pain, sinus pain and sore throat.   Eyes: Negative for pain, discharge and redness.   Respiratory: Negative for cough, sputum production, shortness of breath and wheezing.   Cardiovascular: Negative.  Negative for chest pain and palpitations.  Gastrointestinal: Negative for abdominal pain, heartburn, nausea and vomiting.  Skin: Positive for itching and rash.  Neurological: Negative for dizziness and headaches.  Endo/Heme/Allergies: Negative for environmental allergies. Does not bruise/bleed easily.       Objective:   Blood pressure 122/86, pulse 72, temperature 98.1 F (36.7 C), temperature source Temporal, resp. rate 18, SpO2 100 %. There is no height or weight on file to calculate BMI.   Physical Exam:  Physical Exam  Constitutional: She appears well-developed.  HENT:  Head: Normocephalic and atraumatic.  Right Ear: Tympanic membrane, external ear and ear canal normal.  Left Ear: Tympanic membrane and ear canal normal.  Nose: No mucosal edema, rhinorrhea, nasal deformity or septal deviation. No epistaxis. Right sinus exhibits no maxillary sinus tenderness and no frontal sinus tenderness. Left sinus exhibits no maxillary sinus tenderness and no frontal sinus tenderness.  Mouth/Throat: Uvula is midline and oropharynx is clear and moist. Mucous membranes are not pale and not dry.  Eyes: Pupils are equal, round, and reactive to light. Conjunctivae and EOM are normal. Right eye exhibits no chemosis and no discharge. Left eye exhibits no chemosis and no discharge. Right conjunctiva is not injected. Left conjunctiva is not injected.  Cardiovascular: Normal rate, regular rhythm and normal heart sounds.  Respiratory: Effort normal and breath sounds normal. No accessory muscle usage. No tachypnea. No respiratory distress. She has no wheezes. She has no rhonchi. She has no rales. She exhibits no tenderness.  Lymphadenopathy:    She has no cervical adenopathy.  Neurological: [name] alert.  Skin: No abrasion, no petechiae and no rash noted. Rash is not papular, not vesicular  and not urticarial. No erythema. No pallor.  Psychiatric: She has a normal mood and affect.     Diagnostic studies: none    Salvatore Marvel, MD  [facility]

## 2019-05-18 ENCOUNTER — Encounter: Payer: Self-pay | Admitting: Allergy & Immunology

## 2019-05-18 ENCOUNTER — Other Ambulatory Visit: Payer: Self-pay | Admitting: Allergy & Immunology

## 2019-05-18 ENCOUNTER — Encounter (HOSPITAL_COMMUNITY): Payer: PRIVATE HEALTH INSURANCE | Admitting: Physical Therapy

## 2019-05-24 ENCOUNTER — Ambulatory Visit (INDEPENDENT_AMBULATORY_CARE_PROVIDER_SITE_OTHER): Payer: PRIVATE HEALTH INSURANCE | Admitting: *Deleted

## 2019-05-24 DIAGNOSIS — J309 Allergic rhinitis, unspecified: Secondary | ICD-10-CM | POA: Diagnosis not present

## 2019-05-31 ENCOUNTER — Ambulatory Visit (INDEPENDENT_AMBULATORY_CARE_PROVIDER_SITE_OTHER): Payer: PRIVATE HEALTH INSURANCE

## 2019-05-31 DIAGNOSIS — J309 Allergic rhinitis, unspecified: Secondary | ICD-10-CM | POA: Diagnosis not present

## 2019-06-07 ENCOUNTER — Ambulatory Visit (INDEPENDENT_AMBULATORY_CARE_PROVIDER_SITE_OTHER): Payer: PRIVATE HEALTH INSURANCE

## 2019-06-07 DIAGNOSIS — J309 Allergic rhinitis, unspecified: Secondary | ICD-10-CM | POA: Diagnosis not present

## 2019-06-16 ENCOUNTER — Ambulatory Visit (INDEPENDENT_AMBULATORY_CARE_PROVIDER_SITE_OTHER): Payer: 59

## 2019-06-16 DIAGNOSIS — J309 Allergic rhinitis, unspecified: Secondary | ICD-10-CM

## 2019-06-20 ENCOUNTER — Other Ambulatory Visit: Payer: Self-pay | Admitting: Nurse Practitioner

## 2019-06-20 DIAGNOSIS — M545 Low back pain, unspecified: Secondary | ICD-10-CM

## 2019-06-22 ENCOUNTER — Ambulatory Visit (INDEPENDENT_AMBULATORY_CARE_PROVIDER_SITE_OTHER): Payer: 59

## 2019-06-22 ENCOUNTER — Encounter: Payer: Self-pay | Admitting: Orthopaedic Surgery

## 2019-06-22 ENCOUNTER — Ambulatory Visit (INDEPENDENT_AMBULATORY_CARE_PROVIDER_SITE_OTHER): Payer: 59 | Admitting: Orthopaedic Surgery

## 2019-06-22 VITALS — Ht 63.0 in | Wt 201.0 lb

## 2019-06-22 DIAGNOSIS — M79671 Pain in right foot: Secondary | ICD-10-CM | POA: Diagnosis not present

## 2019-06-22 NOTE — Progress Notes (Signed)
Office Visit Note   Patient: Leah Phillips           Date of Birth: [birth year]           MRN: [id number] Visit Date: 06/22/2019              Requested by: Kirstie Peri, MD [address] [place],  [place] [address] PCP: Kirstie Peri, MD   Assessment & Plan: Visit Diagnoses:  1. Pain in right foot     Plan: Post bunion osteotomy chevron 2019 healed with small recurrent dorsal bunion.  Recommended she look for shoes that have a more firm last with flex flexibility of the forefoot she states many of her shoes has a seen that comes directly over the dorsum of the MP joint.  We discussed shoe wear and she expressed unhappiness that she already has multiple pairs of shoes that now seem to be a probable since she tried on the newest pair of new balance shoes that she purchased.  Follow-Up Instructions: No follow-ups on file.   Orders:  Orders Placed This Encounter  Procedures  . XR Foot Complete Right   No orders of the defined types were placed in this encounter.     Procedures: No procedures performed   Clinical Data: No additional findings.   Subjective: Chief Complaint  Patient presents with  . Right Foot - Pain    HPI 49 year old female here with pain in her right foot which had a bunion chevron osteotomy 09/20/2017.  She states she recently had purchased some new shoes and just put them on and then started having pain over the dorsum of her MP joint.  She has small prominence of the dorsal bunion and trace erythema of the skin.  She states she has had trouble with shoe wearing since that time.  Review of Systems 14 point system update unchanged from 10/06/2018,  Other than as mentioned in HPI.   Objective: Vital Signs: Ht 5\' 3"  (1.6 m)   Wt 201 lb (91.2 kg)   BMI 35.61 kg/m   Physical Exam Constitutional:      Appearance: [name] well-developed.  HENT:     Head: Normocephalic.     Right Ear: External ear normal.     Left Ear: External ear normal.  Eyes:     Pupils:  Pupils are equal, round, and reactive to light.  Neck:     Thyroid: No thyromegaly.     Trachea: No tracheal deviation.  Cardiovascular:     Rate and Rhythm: Normal rate.  Pulmonary:     Effort: Pulmonary effort is normal.  Abdominal:     Palpations: Abdomen is soft.  Skin:    General: Skin is warm and dry.  Neurological:     Mental Status: [name] alert and oriented to person, place, and time.  Psychiatric:        Behavior: Behavior normal.     Ortho Exam patient has well-healed right and left bunion incisions.  Slight dorsal bunion over the MP joint right foot with some tenderness over the dorsum.  She has plantar flexion dorsiflexion MP joint without significant pain no synovitis.  Medial incision is well-healed.  Flexor tendon sesamoids are normal.  Dorsal tenosynovium over the midfoot is normal.  Good ankle range of motion negative popliteal compression test negative straight leg raising.  Specialty Comments:  No specialty comments available.  Imaging: No results found.   PMFS History: Patient Active Problem List   Diagnosis Date  Noted  . Seasonal and perennial allergic rhinitis 02/15/2019  . Allergic urticaria 02/15/2019  . Partial tear of left Achilles tendon 08/25/2018  . Bunion of great toe of right foot 09/02/2017  . Ganglion cyst of finger of right hand 02/04/2017  . Acute pain of left knee 01/14/2017  . Right elbow pain 01/14/2017  . Pes anserinus bursitis 02/15/2014  . PRURITUS 10/15/2009  . FATIGUE 07/23/2009  . SHOULDER PAIN, RIGHT 06/21/2009  . NECK PAIN 05/06/2009  . BACK PAIN 02/21/2009  . HYPERTENSION 10/02/2008  . KNEE PAIN, RIGHT 09/27/2008  . FOLLICULITIS 06/16/3233  . UNSPECIFIED VAGINITIS AND VULVOVAGINITIS 01/04/2008  . 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 OBESITY, UNSPECIFIED 11/11/2007  . ALLERGIC RHINITIS, SEASONAL 11/11/2007  . A C L SPRAIN-CHRONIC 06/23/2007  . TEAR A C L 06/23/2007   Past Medical History:  Diagnosis Date  .  Angio-edema   . Carpal tunnel syndrome   . Urticaria     Family History  Problem Relation Age of Onset  . Allergic rhinitis Mother   . Asthma Sister   . Angioedema Neg Hx   . Atopy Neg Hx   . Eczema Neg Hx   . Immunodeficiency Neg Hx   . Urticaria Neg Hx     Past Surgical History:  Procedure Laterality Date  . ANTERIOR CRUCIATE LIGAMENT REPAIR Left   . CARPAL TUNNEL RELEASE Right 04/01/2015   Procedure: Right Carpal Tunnel Release;  Surgeon: Marybelle Killings, MD;  Location: [facility];  Service: Orthopedics;  Laterality: Right;  . CHOLECYSTECTOMY    . EXCISION METACARPAL MASS Right 04/01/2015   Procedure: Excision Right Long Finger Mass;  Surgeon: Marybelle Killings, MD;  Location: [facility];  Service: Orthopedics;  Laterality: Right;   Social History   Occupational History  . Not on file  Tobacco Use  . Smoking status: Never Smoker  . Smokeless tobacco: Never Used  Substance and Sexual Activity  . Alcohol use: Yes    Comment: social  . Drug use: No  . Sexual activity: Not on file

## 2019-06-23 ENCOUNTER — Ambulatory Visit (INDEPENDENT_AMBULATORY_CARE_PROVIDER_SITE_OTHER): Payer: 59

## 2019-06-23 ENCOUNTER — Ambulatory Visit (HOSPITAL_COMMUNITY)
Admission: RE | Admit: 2019-06-23 | Discharge: 2019-06-23 | Disposition: A | Discharge status: Home / Self Care | Payer: 59 | Source: Ambulatory Visit | Attending: Nurse Practitioner | Admitting: Nurse Practitioner

## 2019-06-23 ENCOUNTER — Other Ambulatory Visit: Payer: Self-pay

## 2019-06-23 DIAGNOSIS — M545 Low back pain, unspecified: Secondary | ICD-10-CM

## 2019-06-23 DIAGNOSIS — J309 Allergic rhinitis, unspecified: Secondary | ICD-10-CM

## 2019-06-30 ENCOUNTER — Ambulatory Visit (INDEPENDENT_AMBULATORY_CARE_PROVIDER_SITE_OTHER): Payer: 59

## 2019-06-30 DIAGNOSIS — J309 Allergic rhinitis, unspecified: Secondary | ICD-10-CM

## 2019-07-05 ENCOUNTER — Ambulatory Visit (INDEPENDENT_AMBULATORY_CARE_PROVIDER_SITE_OTHER): Payer: 59

## 2019-07-05 DIAGNOSIS — J309 Allergic rhinitis, unspecified: Secondary | ICD-10-CM

## 2019-07-08 ENCOUNTER — Other Ambulatory Visit: Payer: Self-pay | Admitting: Allergy & Immunology

## 2019-07-14 ENCOUNTER — Ambulatory Visit (INDEPENDENT_AMBULATORY_CARE_PROVIDER_SITE_OTHER): Payer: 59

## 2019-07-14 DIAGNOSIS — J309 Allergic rhinitis, unspecified: Secondary | ICD-10-CM | POA: Diagnosis not present

## 2019-07-21 ENCOUNTER — Ambulatory Visit: Payer: Self-pay

## 2019-09-11 ENCOUNTER — Other Ambulatory Visit: Payer: Self-pay

## 2019-09-11 MED ORDER — FAMOTIDINE 20 MG PO TABS: ORAL_TABLET | ORAL | 0 refills | Status: DC

## 2019-10-23 ENCOUNTER — Other Ambulatory Visit: Payer: Self-pay

## 2019-10-23 MED ORDER — FEXOFENADINE HCL 180 MG PO TABS: 180.0000 mg | ORAL_TABLET | Freq: Every day | ORAL | 0 refills | Status: DC

## 2019-10-23 NOTE — Telephone Encounter (Signed)
Refill for fexofenadine 180 mg x 1 with no refills. Patient is due for appt in June. Appt is scheduled. Patient is informed of refill.

## 2019-11-15 ENCOUNTER — Ambulatory Visit (INDEPENDENT_AMBULATORY_CARE_PROVIDER_SITE_OTHER): Payer: 59 | Admitting: Allergy & Immunology

## 2019-11-15 ENCOUNTER — Other Ambulatory Visit: Payer: Self-pay

## 2019-11-15 ENCOUNTER — Encounter: Payer: Self-pay | Admitting: Allergy & Immunology

## 2019-11-15 VITALS — BP 118/72 | HR 74 | Resp 18 | Ht 63.0 in | Wt 220.0 lb

## 2019-11-15 DIAGNOSIS — J3089 Other allergic rhinitis: Secondary | ICD-10-CM | POA: Diagnosis not present

## 2019-11-15 DIAGNOSIS — J302 Other seasonal allergic rhinitis: Secondary | ICD-10-CM | POA: Diagnosis not present

## 2019-11-15 DIAGNOSIS — L500 Allergic urticaria: Secondary | ICD-10-CM | POA: Diagnosis not present

## 2019-11-15 NOTE — Patient Instructions (Addendum)
1. Seasonal and perennial allergic rhinitis (grasses, ragweed, weeds, trees, indoor molds, outdoor molds, dust mites, cat and cockroach) - Continue with shots at the same schedule. - Continue taking: Allegra (fexofenadine) 180mg  table once AT NIGHT and Singulair (montelukast) 10mg  daily AT NIGHT - You can use an extra dose of the antihistamine, if needed, for breakthrough symptoms.  - Consider nasal saline rinses 1-2 times daily to remove allergens from the nasal cavities as well as help with mucous clearance (this is especially helpful to do before the nasal sprays are given)  2. Allergic urticaria  - We will get some lab work today. - We will restart allergy shots at the Gold Vial 0.05 mL.  - In the meantime, continue with suppressive dosing of antihistamines (we are not doing higher doses since you have):  - Allegra (fexofenadine) 180 mg table once AT NIGHT + Pepcid (famotidine) 20 mg AT NIGHT + Singulair (montelukast) 10 mg AT NIGHT  3. Return in about 3 months (around 02/15/2020). This can be an in-person, a virtual Webex or a telephone follow up visit.   Please inform of any Emergency Department visits, hospitalizations, or changes in symptoms. Call 04/16/2020 before going to the ED for breathing or allergy symptoms since we might be able to fit you in for a sick visit. Feel free to contact us anytime with any questions, problems, or concerns.  It was a pleasure to see you again today!  Websites that have reliable patient information: 1. American Academy of Asthma, Allergy, and Immunology: www.aaaai.org 2. Food Allergy Research and Education (FARE): foodallergy.org 3. Mothers of Asthmatics: http://www.asthmacommunitynetwork.org 4. American College of Allergy, Asthma, and Immunology: www.acaai.org   COVID-19 Vaccine Information can be found at: [place] For questions related to vaccine distribution or appointments, please  email [email or web address] or call [phone].   If you have any questions about the vaccination, please do give [email or web address] a call and we can talk about it!     "Like" [phone] on Facebook and Instagram for our latest updates!        Make sure you are registered to vote! If you have moved or changed any of your contact information, you will need to get this updated before voting!  In some cases, you MAY be able to register to vote online: [place]

## 2019-11-15 NOTE — Progress Notes (Signed)
FOLLOW UP  Date of Service/Encounter:  11/15/19   Assessment:   Seasonal and perennial allergic rhinitis(grasses, ragweed, weeds, trees, indoor molds, outdoor molds, dust mites, cat and cockroach) - on allergen immunotherapy  Allergic urticaria - will try to wean off antihistamines once allergy shots are closer to maintenance dosing   Plan/Recommendations:   1. Seasonal and perennial allergic rhinitis (grasses, ragweed, weeds, trees, indoor molds, outdoor molds, dust mites, cat and cockroach) - Continue with shots at the same schedule. - Continue taking: Allegra (fexofenadine) 180mg  table once AT NIGHT and Singulair (montelukast) 10mg  daily AT NIGHT - You can use an extra dose of the antihistamine, if needed, for breakthrough symptoms.  - Consider nasal saline rinses 1-2 times daily to remove allergens from the nasal cavities as well as help with mucous clearance (this is especially helpful to do before the nasal sprays are given)  2. Allergic urticaria  - We will get some lab work today. - We will restart allergy shots at the Gold Vial 0.05 mL.  - In the meantime, continue with suppressive dosing of antihistamines (we are not doing higher doses since you have):  - Allegra (fexofenadine) 180 mg table once AT NIGHT + Pepcid (famotidine) 20 mg AT NIGHT + Singulair (montelukast) 10 mg AT NIGHT  3. Return in about 3 months (around 02/15/2020). This can be an in-person, a virtual Webex or a telephone follow up visit.  Subjective:   Leah Phillips is a 49 y.o. female presenting today for follow up of  Chief Complaint  Patient presents with  . Allergic Rhinitis     stopped allergy injections back in 07/2019 due to issues. if she misses her antihistmines she breaks out in hives.     Leah Phillips has a history of the following: Patient Active Problem List   Diagnosis Date Noted  . Seasonal and perennial allergic rhinitis 02/15/2019  . Allergic urticaria 02/15/2019  . Partial  tear of left Achilles tendon 08/25/2018  . Bunion of great toe of right foot 09/02/2017  . Ganglion cyst of finger of right hand 02/04/2017  . Acute pain of left knee 01/14/2017  . Right elbow pain 01/14/2017  . Pes anserinus bursitis 02/15/2014  . PRURITUS 10/15/2009  . FATIGUE 07/23/2009  . SHOULDER PAIN, RIGHT 06/21/2009  . NECK PAIN 05/06/2009  . BACK PAIN 02/21/2009  . HYPERTENSION 10/02/2008  . KNEE PAIN, RIGHT 09/27/2008  . FOLLICULITIS 19/37/9024  . UNSPECIFIED VAGINITIS AND VULVOVAGINITIS 01/04/2008  . 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 OBESITY, UNSPECIFIED 11/11/2007  . ALLERGIC RHINITIS, SEASONAL 11/11/2007  . A C L SPRAIN-CHRONIC 06/23/2007  . TEAR A C L 06/23/2007    History obtained from: chart review and patient.  Leah Phillips is a 49 y.o. female presenting for a follow up visit. She was last seen in December 2020. At that time, we continued with her allergy shots. We continued with Allegra at night and Singulair at night. For her urticaria, her symptoms seemed to be getting better so we deferred lab testing for her urticaria. Her allergy shots seemed to be working well to control her rhinitis as well as her urticaria.   In the interim, she stopped her allergy shots in February 2021 because she developed a large red rash that developed on her torso and her arm. She stopped the injections and it took one month to clear up. She does report some itchiness. She has never had a similar rash as that. The rash occurred on his  underarms (not near the site of the injections) as well as her flanks.    She takes the Allegra and the Singulair at night. If she does not take it, she itches a lot with hives. This was the case even when she was on her allergy shots. She denies any correlation with any particular food. There is no history of autoimmune disease in her family.   Otherwise, there have been no changes to her past medical history, surgical history, family history, or social  history.    Review of Systems  Constitutional: Negative.  Negative for chills, fever, malaise/fatigue and weight loss.  HENT: Positive for congestion. Negative for ear discharge, ear pain and sinus pain.   Eyes: Negative for pain, discharge and redness.  Respiratory: Negative for cough, sputum production, shortness of breath and wheezing.   Cardiovascular: Negative.  Negative for chest pain and palpitations.  Gastrointestinal: Negative for abdominal pain, constipation, diarrhea, heartburn, nausea and vomiting.  Skin: Positive for itching and rash.  Neurological: Negative for dizziness and headaches.  Endo/Heme/Allergies: Positive for environmental allergies. Does not bruise/bleed easily.       Objective:   Blood pressure 118/72, pulse 74, resp. rate 18, height 5\' 3"  (1.6 m), weight 220 lb (99.8 kg), SpO2 100 %. Body mass index is 38.97 kg/m.   Physical Exam:  Physical Exam  Constitutional: She appears well-developed.  HENT:  Head: Normocephalic and atraumatic.  Right Ear: Tympanic membrane, external ear and ear canal normal.  Left Ear: Tympanic membrane, external ear and ear canal normal.  Nose: Mucosal edema and rhinorrhea present. No nasal deformity or septal deviation. No epistaxis. Right sinus exhibits no maxillary sinus tenderness and no frontal sinus tenderness. Left sinus exhibits no maxillary sinus tenderness and no frontal sinus tenderness.  Mouth/Throat: Uvula is midline and oropharynx is clear and moist. Mucous membranes are not pale and not dry.  Eyes: Pupils are equal, round, and reactive to light. Conjunctivae and EOM are normal. Right eye exhibits no chemosis and no discharge. Left eye exhibits no chemosis and no discharge. Right conjunctiva is not injected. Left conjunctiva is not injected.  Cardiovascular: Normal rate, regular rhythm and normal heart sounds.  Respiratory: Effort normal and breath sounds normal. No accessory muscle usage. No tachypnea. No  respiratory distress. She has no wheezes. She has no rhonchi. She has no rales. She exhibits no tenderness.  Lymphadenopathy:    She has no cervical adenopathy.  Neurological: [name] alert.  Skin: No abrasion, no petechiae and no rash noted. Rash is not papular, not vesicular and not urticarial. No erythema. No pallor.  No permanent skin changes. She does not have pictures of the rash at all.   Psychiatric: She has a normal mood and affect.     Diagnostic studies: none      , MD  [facility]

## 2019-11-17 ENCOUNTER — Ambulatory Visit: Payer: 59

## 2019-11-17 ENCOUNTER — Other Ambulatory Visit: Payer: Self-pay

## 2019-11-17 ENCOUNTER — Ambulatory Visit (INDEPENDENT_AMBULATORY_CARE_PROVIDER_SITE_OTHER): Payer: 59

## 2019-11-17 DIAGNOSIS — J309 Allergic rhinitis, unspecified: Secondary | ICD-10-CM | POA: Diagnosis not present

## 2019-11-20 ENCOUNTER — Ambulatory Visit (HOSPITAL_BASED_OUTPATIENT_CLINIC_OR_DEPARTMENT_OTHER): Payer: 59 | Admitting: Obstetrics & Gynecology

## 2019-11-20 ENCOUNTER — Encounter: Payer: Self-pay | Admitting: Obstetrics & Gynecology

## 2019-11-20 ENCOUNTER — Other Ambulatory Visit (HOSPITAL_COMMUNITY)
Admission: RE | Admit: 2019-11-20 | Discharge: 2019-11-20 | Disposition: A | Discharge status: Home / Self Care | Payer: 59 | Source: Ambulatory Visit | Attending: Obstetrics & Gynecology | Admitting: Obstetrics & Gynecology

## 2019-11-20 ENCOUNTER — Other Ambulatory Visit: Payer: Self-pay

## 2019-11-20 VITALS — BP 124/94 | HR 72 | Ht 63.0 in | Wt 222.1 lb

## 2019-11-20 DIAGNOSIS — R232 Flushing: Secondary | ICD-10-CM | POA: Diagnosis not present

## 2019-11-20 DIAGNOSIS — N939 Abnormal uterine and vaginal bleeding, unspecified: Secondary | ICD-10-CM

## 2019-11-20 DIAGNOSIS — Z1211 Encounter for screening for malignant neoplasm of colon: Secondary | ICD-10-CM

## 2019-11-20 DIAGNOSIS — N951 Menopausal and female climacteric states: Secondary | ICD-10-CM

## 2019-11-20 DIAGNOSIS — N840 Polyp of corpus uteri: Secondary | ICD-10-CM | POA: Diagnosis not present

## 2019-11-20 DIAGNOSIS — Z01419 Encounter for gynecological examination (general) (routine) without abnormal findings: Secondary | ICD-10-CM | POA: Diagnosis not present

## 2019-11-20 DIAGNOSIS — Z1239 Encounter for other screening for malignant neoplasm of breast: Secondary | ICD-10-CM

## 2019-11-20 LAB — POCT URINE PREGNANCY: Preg Test, Ur: NEGATIVE

## 2019-11-20 MED ORDER — MEGESTROL ACETATE 40 MG PO TABS: 40.0000 mg | ORAL_TABLET | Freq: Every day | ORAL | 4 refills | Status: DC

## 2019-11-20 MED ORDER — MEGESTROL ACETATE 40 MG PO TABS: 40.0000 mg | ORAL_TABLET | Freq: Two times a day (BID) | ORAL | 3 refills | Status: DC

## 2019-11-20 NOTE — Progress Notes (Signed)
Subjective:     Leah Phillips is a 49 y.o. female here for a routine exam.  Current complaints: Pt reports a h/o AUB. She was prescribed Provera by her primary care provider but, not workup was done to assess the etiology of the bleeding.  Pt reports that she has had some skipped menses in Oct and Jan but, her other menses have been heavier and longer since Jan 2021. She reports hot flushes that are bearable and some mood changes. [name] sexually active and monogamous.       Gynecologic History Patient's last menstrual period was 10/23/2019. Contraception: vasectomy Last Pap: 11/16/2018. Results were: normal Last mammogram: 2018. Results were: normal  Obstetric History OB History  Gravida Para Term Preterm AB Living  3 1   1 2 1   SAB TAB Ectopic Multiple Live Births  2       1    # Outcome Date GA Lbr Len/2nd Weight Sex Delivery Anes PTL Lv  3 SAB 2008          2 Preterm 1989 [redacted]w[redacted]d   M Vag-Spont None N LIV  1 SAB              The following portions of the patient's history were reviewed and updated as appropriate: allergies, current medications, past family history, past medical history, past social history, past surgical history and problem list.  Review of Systems Pertinent items are noted in HPI.    Objective:  BP (!) 124/94   Pulse 72   Ht 5\' 3"  (1.6 m)   Wt 222 lb 1.9 oz (100.8 kg)   LMP 10/23/2019   BMI 39.35 kg/m  General Appearance:    Alert, cooperative, no distress, appears stated age  Head:    Normocephalic, without obvious abnormality, atraumatic  Eyes:    conjunctiva/corneas clear, EOM's intact, both eyes  Ears:    Normal external ear canals, both ears  Nose:   Nares normal, septum midline, mucosa normal, no drainage    or sinus tenderness  Throat:   Lips, mucosa, and tongue normal; teeth and gums normal  Neck:   Supple, symmetrical, trachea midline, no adenopathy;    thyroid:  no enlargement/tenderness/nodules  Back:     Symmetric, no curvature, ROM normal,  no CVA tenderness  Lungs:     respirations unlabored  Chest Wall:    No tenderness or deformity   Heart:    Regular rate and rhythm  Breast Exam:    No tenderness, masses, or nipple abnormality  Abdomen:     Soft, non-tender, bowel sounds active all four quadrants,    no masses, no organomegaly  Genitalia:    Normal female without lesion, discharge or tenderness   Uterus small and mobile. No adnexal masses noted.   Extremities:   Extremities normal, atraumatic, no cyanosis or edema  Pulses:   2+ and symmetric all extremities  Skin:   Skin color, texture, turgor normal, no rashes or lesions    The indications for endometrial biopsy were reviewed.   Risks of the biopsy including cramping, bleeding, infection, uterine perforation, inadequate specimen and need for additional procedures  were discussed. The patient states she understands and agrees to undergo procedure today. Consent was signed. Time out was performed. Urine HCG was negative. A sterile speculum was placed in the patient's vagina and the cervix was prepped with Betadine. A single-toothed tenaculum was placed on the anterior lip of the cervix to stabilize it. The 3 mm  pipelle was introduced into the endometrial cavity without difficulty to a depth of 8cm, and a moderate amount of tissue was obtained and sent to pathology. The instruments were removed from the patient's vagina. Minimal bleeding from the cervix was noted. The patient tolerated the procedure well. Routine post-procedure instructions were given to the patient. The patient will follow up to review the results and for further management.      Assessment:    Healthy female exam.   AUB- suspect perimenopause. Endo bx done today.      Plan:   F/u PAP with hrHPV F/u surg path Screening mammogram Megace 40mg  bid  F/u in 3 months or sooner prn   Milla Wahlberg L. Harraway-Smith, M.D., [facility]

## 2019-11-20 NOTE — Patient Instructions (Signed)

## 2019-11-21 LAB — CYTOLOGY - PAP
Comment: NEGATIVE
Diagnosis: NEGATIVE
High risk HPV: NEGATIVE

## 2019-11-21 LAB — SURGICAL PATHOLOGY

## 2019-11-23 NOTE — Progress Notes (Unsigned)
Pt called our Nurse line regarding her visit with Dr. Erin Fulling, Pt states the Rx that was sent to her pharmacy is not the same medicine that she discussed with Dr. Request call back please. Was seen in our [facility] office.

## 2019-11-24 ENCOUNTER — Ambulatory Visit (INDEPENDENT_AMBULATORY_CARE_PROVIDER_SITE_OTHER): Payer: 59

## 2019-11-24 DIAGNOSIS — J309 Allergic rhinitis, unspecified: Secondary | ICD-10-CM | POA: Diagnosis not present

## 2019-11-25 ENCOUNTER — Other Ambulatory Visit: Payer: Self-pay | Admitting: Allergy & Immunology

## 2019-11-25 LAB — ALPHA-GAL PANEL
Alpha Gal IgE*: <0.1 kU/L (ref <0.10)
Beef (Bos spp) IgE: <0.1 kU/L (ref <0.35)
Class Interpretation: 0
Class Interpretation: 0
Class Interpretation: 0
Lamb/Mutton (Ovis spp) IgE: <0.1 kU/L (ref <0.35)
Pork (Sus spp) IgE: <0.1 kU/L (ref <0.35)

## 2019-11-25 LAB — CMP14+EGFR
ALT: 12 IU/L (ref 0–32)
AST: 16 IU/L (ref 0–40)
Albumin/Globulin Ratio: 1.4 (ref 1.2–2.2)
Albumin: 4.2 g/dL (ref 3.8–4.8)
Alkaline Phosphatase: 65 IU/L (ref 48–121)
BUN/Creatinine Ratio: 14 (ref 9–23)
BUN: 11 mg/dL (ref 6–24)
Bilirubin Total: <0.2 mg/dL (ref 0.0–1.2)
CO2: 28 mmol/L (ref 20–29)
Calcium: 9.7 mg/dL (ref 8.7–10.2)
Chloride: 100 mmol/L (ref 96–106)
Creatinine, Ser: 0.79 mg/dL (ref 0.57–1.00)
GFR calc Af Amer: 102 mL/min/{1.73_m2} (ref >59)
GFR calc non Af Amer: 89 mL/min/{1.73_m2} (ref >59)
Globulin, Total: 2.9 g/dL (ref 1.5–4.5)
Glucose: 66 mg/dL (ref 65–99)
Potassium: 4 mmol/L (ref 3.5–5.2)
Sodium: 142 mmol/L (ref 134–144)
Total Protein: 7.1 g/dL (ref 6.0–8.5)

## 2019-11-25 LAB — ALLERGEN PROFILE, BASIC FOOD
Allergen Corn, IgE: <0.1 kU/L
Beef IgE: <0.1 kU/L
Chocolate/Cacao IgE: <0.1 kU/L
Egg, Whole IgE: 0.29 kU/L — AB
Food Mix (Seafoods) IgE: NEGATIVE
Milk IgE: 1.68 kU/L — AB
Peanut IgE: 0.33 kU/L — AB
Pork IgE: <0.1 kU/L
Soybean IgE: 0.14 kU/L — AB
Wheat IgE: 0.21 kU/L — AB

## 2019-11-25 LAB — ANA W/REFLEX IF POSITIVE: Anti Nuclear Antibody (ANA): NEGATIVE

## 2019-11-25 LAB — CHRONIC URTICARIA: cu index: <1.8 (ref <10)

## 2019-11-25 LAB — TRYPTASE: Tryptase: 6.5 ug/L (ref 2.2–13.2)

## 2019-11-25 LAB — C-REACTIVE PROTEIN: CRP: 2 mg/L (ref 0–10)

## 2019-11-25 LAB — SEDIMENTATION RATE: Sed Rate: 13 mm/hr (ref 0–32)

## 2019-11-27 ENCOUNTER — Ambulatory Visit (HOSPITAL_BASED_OUTPATIENT_CLINIC_OR_DEPARTMENT_OTHER)
Admission: RE | Admit: 2019-11-27 | Discharge: 2019-11-27 | Disposition: A | Discharge status: Home / Self Care | Payer: 59 | Source: Ambulatory Visit | Attending: Obstetrics & Gynecology | Admitting: Obstetrics & Gynecology

## 2019-11-27 ENCOUNTER — Telehealth: Payer: Self-pay

## 2019-11-27 ENCOUNTER — Ambulatory Visit (HOSPITAL_BASED_OUTPATIENT_CLINIC_OR_DEPARTMENT_OTHER): Admission: RE | Admit: 2019-11-27 | Payer: 59 | Source: Ambulatory Visit

## 2019-11-27 ENCOUNTER — Other Ambulatory Visit: Payer: Self-pay

## 2019-11-27 ENCOUNTER — Inpatient Hospital Stay (HOSPITAL_BASED_OUTPATIENT_CLINIC_OR_DEPARTMENT_OTHER): Admission: RE | Admit: 2019-11-27 | Payer: 59 | Source: Ambulatory Visit

## 2019-11-27 DIAGNOSIS — Z1239 Encounter for other screening for malignant neoplasm of breast: Secondary | ICD-10-CM | POA: Diagnosis present

## 2019-11-27 DIAGNOSIS — N951 Menopausal and female climacteric states: Secondary | ICD-10-CM | POA: Diagnosis present

## 2019-11-27 DIAGNOSIS — N939 Abnormal uterine and vaginal bleeding, unspecified: Secondary | ICD-10-CM | POA: Insufficient documentation

## 2019-11-27 NOTE — Telephone Encounter (Signed)
-----   Message from Willodean Rosenthal, MD sent at 11/24/2019  7:20 PM EDT ----- Pt called re the meds she was prescribed. She apparently got to the [facility]. [name] supposed to be on Megace 40mg  bid. Is the generic name confusing her???  Please call to clarify.   Thx, Clh-S

## 2019-11-27 NOTE — Telephone Encounter (Signed)
Called pt to discuss Megace dosage. Pt made aware that she is to take Megace 40 mg bid. Understanding was voiced.  Erlean Mealor l Cicley Ganesh, CMA

## 2019-11-28 ENCOUNTER — Other Ambulatory Visit (HOSPITAL_BASED_OUTPATIENT_CLINIC_OR_DEPARTMENT_OTHER): Payer: 59

## 2019-11-28 ENCOUNTER — Ambulatory Visit (HOSPITAL_BASED_OUTPATIENT_CLINIC_OR_DEPARTMENT_OTHER): Payer: 59

## 2019-12-01 ENCOUNTER — Ambulatory Visit (INDEPENDENT_AMBULATORY_CARE_PROVIDER_SITE_OTHER): Payer: 59

## 2019-12-01 DIAGNOSIS — J309 Allergic rhinitis, unspecified: Secondary | ICD-10-CM

## 2019-12-07 ENCOUNTER — Other Ambulatory Visit: Payer: Self-pay | Admitting: Allergy & Immunology

## 2019-12-20 ENCOUNTER — Telehealth: Payer: Self-pay | Admitting: Orthopaedic Surgery

## 2019-12-20 NOTE — Telephone Encounter (Signed)
Received call from pt & [facility] stating records not received that we faxed 6/28. I refaxed.

## 2019-12-22 ENCOUNTER — Ambulatory Visit (INDEPENDENT_AMBULATORY_CARE_PROVIDER_SITE_OTHER): Payer: 59

## 2019-12-22 DIAGNOSIS — J309 Allergic rhinitis, unspecified: Secondary | ICD-10-CM

## 2019-12-29 ENCOUNTER — Ambulatory Visit (INDEPENDENT_AMBULATORY_CARE_PROVIDER_SITE_OTHER): Payer: 59

## 2019-12-29 DIAGNOSIS — J309 Allergic rhinitis, unspecified: Secondary | ICD-10-CM

## 2020-01-02 DIAGNOSIS — M2011 Hallux valgus (acquired), right foot: Secondary | ICD-10-CM | POA: Insufficient documentation

## 2020-01-02 DIAGNOSIS — M7741 Metatarsalgia, right foot: Secondary | ICD-10-CM | POA: Insufficient documentation

## 2020-01-05 ENCOUNTER — Ambulatory Visit (INDEPENDENT_AMBULATORY_CARE_PROVIDER_SITE_OTHER): Payer: 59

## 2020-01-05 DIAGNOSIS — J309 Allergic rhinitis, unspecified: Secondary | ICD-10-CM

## 2020-01-12 ENCOUNTER — Other Ambulatory Visit: Payer: Self-pay

## 2020-01-12 ENCOUNTER — Ambulatory Visit (INDEPENDENT_AMBULATORY_CARE_PROVIDER_SITE_OTHER): Payer: 59

## 2020-01-12 DIAGNOSIS — N939 Abnormal uterine and vaginal bleeding, unspecified: Secondary | ICD-10-CM

## 2020-01-12 DIAGNOSIS — J309 Allergic rhinitis, unspecified: Secondary | ICD-10-CM | POA: Diagnosis not present

## 2020-01-12 DIAGNOSIS — N951 Menopausal and female climacteric states: Secondary | ICD-10-CM

## 2020-01-12 MED ORDER — MEGESTROL ACETATE 40 MG PO TABS: 40.0000 mg | ORAL_TABLET | Freq: Two times a day (BID) | ORAL | 3 refills | Status: DC

## 2020-01-18 ENCOUNTER — Other Ambulatory Visit: Payer: Self-pay | Admitting: Allergy & Immunology

## 2020-01-18 DIAGNOSIS — J3081 Allergic rhinitis due to animal (cat) (dog) hair and dander: Secondary | ICD-10-CM | POA: Diagnosis not present

## 2020-01-18 NOTE — Progress Notes (Signed)
EXP 01/17/21 °

## 2020-01-19 ENCOUNTER — Ambulatory Visit (INDEPENDENT_AMBULATORY_CARE_PROVIDER_SITE_OTHER): Payer: 59

## 2020-01-19 DIAGNOSIS — J309 Allergic rhinitis, unspecified: Secondary | ICD-10-CM | POA: Diagnosis not present

## 2020-01-26 ENCOUNTER — Ambulatory Visit (INDEPENDENT_AMBULATORY_CARE_PROVIDER_SITE_OTHER): Payer: 59

## 2020-01-26 DIAGNOSIS — J309 Allergic rhinitis, unspecified: Secondary | ICD-10-CM | POA: Diagnosis not present

## 2020-02-02 ENCOUNTER — Ambulatory Visit (INDEPENDENT_AMBULATORY_CARE_PROVIDER_SITE_OTHER): Payer: 59

## 2020-02-02 DIAGNOSIS — J309 Allergic rhinitis, unspecified: Secondary | ICD-10-CM

## 2020-02-06 NOTE — Patient Instructions (Addendum)
Seasonal and perennial allergic rhinitis (grass, ragweed, weeds, trees, molds, dust mite, cat, cockroach) Continue allergy injections once a week Continue Allegra 180 mg at night to help with runny nose and itching Continue Singulair 10 mg at night  Allergic urticaria Continue Allegra 180 mg at night to help with itching Continue Pepcid 20 mg 1 tablet at night to help with itching Continue Singulair 10 mg 1 tablet at night to help with itching  These let us know if this treatment plan is not working well for you. Schedule follow-up appointment in  6 months

## 2020-02-07 ENCOUNTER — Other Ambulatory Visit: Payer: Self-pay

## 2020-02-07 ENCOUNTER — Ambulatory Visit: Payer: Self-pay

## 2020-02-07 ENCOUNTER — Encounter: Payer: Self-pay | Admitting: Family

## 2020-02-07 ENCOUNTER — Ambulatory Visit (INDEPENDENT_AMBULATORY_CARE_PROVIDER_SITE_OTHER): Payer: 59 | Admitting: Family

## 2020-02-07 VITALS — BP 120/88 | HR 73 | Temp 98.3°F | Resp 18 | Wt 234.0 lb

## 2020-02-07 DIAGNOSIS — J309 Allergic rhinitis, unspecified: Secondary | ICD-10-CM

## 2020-02-07 DIAGNOSIS — L500 Allergic urticaria: Secondary | ICD-10-CM | POA: Diagnosis not present

## 2020-02-07 DIAGNOSIS — J302 Other seasonal allergic rhinitis: Secondary | ICD-10-CM

## 2020-02-07 DIAGNOSIS — J3089 Other allergic rhinitis: Secondary | ICD-10-CM

## 2020-02-07 NOTE — Progress Notes (Signed)
[address] [place] [address] Dept: [phone]  FOLLOW UP NOTE  Patient ID: Leah Phillips, female    DOB: 03-27-1971  Age: 49 y.o. MRN: [id number] Date of Office Visit: 02/07/2020  Assessment  Chief Complaint: Follow-up and Allergic Rhinitis  (just some itching that started this week )  HPI Leah Phillips is a 49 year old female who presents today for follow-up of seasonal and perennial allergic rhinitis and allergic urticaria.  She was last seen on November 15, 2019 by Dr. Dellis Anes.  Seasonal and perennial allergic rhinitis is reported as moderately controlled with Allegra 180 mg at night and Singulair 10 mg at night.  She also continues with allergy injections once a week.  She reports that she has not had any problems with her allergy injections since starting back.  She denies any large local reactions and feels that her allergy injections may be helping some.  She reports occasional postnasal drip and denies any rhinorrhea, nasal congestion, and itchy watery eyes.[name] having surgery this coming Wednesday on her right foot, so it may be a while until she is able to get her next allergy injection..  Allergic urticaria is reported as moderately controlled with Allegra 180 mg at night, Pepcid 20 mg at night, and Singulair 10 mg at night.  This regimen has helped with her urticaria.  She does have some bug bites currently.  She reports on Monday that she took Xyzal due to feeling itchy and feeling like something was crawling on her/in her.  She reports that this has not happened in a while and that the Xyzal helped with this sensation.  She did not ever try to stop her intake of milk, because she reports that she never really drinks milk.  She does drink Lactaid milk, but does eat a lot of cheese.  On Saturday she was stung by a bee in her left upper thigh.  She reports some swelling and itching in denies any any other concomitant symptoms such as respiratory,  gastrointestinal and cutaneous.    Current medications are as listed in chart   Drug Allergies:  Allergies  Allergen Reactions  . 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 Amoxicillin   . Pecan Nut (Diagnostic) Other (See Comments)    Nuts- tested  . Penicillins Hives    Review of Systems: Review of Systems  Constitutional: Negative for chills and fever.  HENT: Negative for congestion.        Denies rhinorrhea and reports occasional post nasal drip  Eyes:       Denies itchy watery eyes  Respiratory: Negative for cough, shortness of breath and wheezing.   Cardiovascular: Positive for palpitations. Negative for chest pain.       Reports palpitations are due to low potassium-is seeing primary care physician for this and has had an EKG  Gastrointestinal: Negative for abdominal pain.  Genitourinary: Negative for dysuria.  Skin: Negative for rash.  Neurological: Negative for headaches.  Endo/Heme/Allergies: Positive for environmental allergies.     Physical Exam: BP 120/88   Pulse 73   Temp 98.3 F (36.8 C) (Temporal)   Resp 18   Wt 234 lb (106.1 kg)   SpO2 98%   BMI 41.45 kg/m    Physical Exam Constitutional:      Appearance: Normal appearance.  HENT:     Head: Normocephalic and atraumatic.     Comments: Pharynx normal. Eyes normal. Ears normal. Nose normal    Right Ear: Tympanic membrane,  ear canal and external ear normal.     Left Ear: Tympanic membrane, ear canal and external ear normal.     Nose: Nose normal.  Eyes:     Conjunctiva/sclera: Conjunctivae normal.  Cardiovascular:     Rate and Rhythm: Normal rate and regular rhythm.     Heart sounds: Normal heart sounds.  Pulmonary:     Effort: Pulmonary effort is normal.     Breath sounds: Normal breath sounds.     Comments: Lungs clear to auscultation Musculoskeletal:     Cervical back: Neck supple.  Skin:    General: Skin is dry.  Neurological:     Mental Status: [name] alert and oriented to person,  place, and time.  Psychiatric:        Mood and Affect: Mood normal.        Behavior: Behavior normal.        Thought Content: Thought content normal.        Judgment: Judgment normal.     Diagnostics: None   Assessment and Plan: 1. Seasonal and perennial allergic rhinitis   2. Allergic urticaria     No orders of the defined types were placed in this encounter.   Patient Instructions  Seasonal and perennial allergic rhinitis (grass, ragweed, weeds, trees, molds, dust mite, cat, cockroach) Continue allergy injections once a week Continue Allegra 180 mg at night to help with runny nose and itching Continue Singulair 10 mg at night  Allergic urticaria Continue Allegra 180 mg at night to help with itching Continue Pepcid 20 mg 1 tablet at night to help with itching Continue Singulair 10 mg 1 tablet at night to help with itching  These let us know if this treatment plan is not working well for you. Schedule follow-up appointment in  6 months     Return in about 6 months (around 08/06/2020), or if symptoms worsen or fail to improve.    Thank you for the opportunity to care for this patient.  Please do not hesitate to contact me with questions.  Nehemiah Settle, FNP [facility]

## 2020-02-09 ENCOUNTER — Encounter: Payer: Self-pay | Admitting: Obstetrics & Gynecology

## 2020-02-09 ENCOUNTER — Other Ambulatory Visit: Payer: Self-pay

## 2020-02-09 ENCOUNTER — Ambulatory Visit (INDEPENDENT_AMBULATORY_CARE_PROVIDER_SITE_OTHER): Payer: 59 | Admitting: Obstetrics & Gynecology

## 2020-02-09 VITALS — BP 127/89 | HR 64 | Ht 63.0 in | Wt 234.0 lb

## 2020-02-09 DIAGNOSIS — N951 Menopausal and female climacteric states: Secondary | ICD-10-CM | POA: Diagnosis not present

## 2020-02-09 DIAGNOSIS — N840 Polyp of corpus uteri: Secondary | ICD-10-CM

## 2020-02-09 DIAGNOSIS — N939 Abnormal uterine and vaginal bleeding, unspecified: Secondary | ICD-10-CM

## 2020-02-09 MED ORDER — MEGESTROL ACETATE 40 MG PO TABS: 40.0000 mg | ORAL_TABLET | Freq: Two times a day (BID) | ORAL | 3 refills | Status: DC

## 2020-02-09 NOTE — Progress Notes (Signed)
History:  49 y.o. [id number] here today for f/u of AUB. Pt reports that she has had no further bleeding since starting the Megace. She reports that if she misses a dose, she starts to have mild cramping so she wants to remain on her same dose. [name] currently taking 40mg  bid with no side effects or complications. In conversation she mentioned that her sister is . She's visiting this weekend with her family to surprise their father.   The following portions of the patient's history were reviewed and updated as appropriate: allergies, current medications, past family history, past medical history, past social history, past surgical history and problem list.  Review of Systems:  Pertinent items are noted in HPI.    Objective:  Physical Exam Blood pressure 127/89, pulse 64, height 5\' 3"  (1.6 m), weight 234 lb (106.1 kg), last menstrual period 02/09/2020.  CONSTITUTIONAL: Well-developed, well-nourished female in no acute distress.  HENT:  Normocephalic, atraumatic EYES: Conjunctivae and EOM are normal. No scleral icterus.  NECK: Normal range of motion SKIN: Skin is warm and dry. No rash noted. Not diaphoretic.No pallor. NEUROLGIC: Alert and oriented to person, place, and time. Normal coordination.  Pelvic: not indicated.   Labs and Imaging 11/27/2019 CLINICAL DATA:  Abnormal uterine bleeding, perimenopausal, LMP 10/07/2019  EXAM: ULTRASOUND PELVIS TRANSVAGINAL  TECHNIQUE: Transvaginal ultrasound examination of the pelvis was performed including evaluation of the uterus, ovaries, adnexal regions, and pelvic cul-de-sac.  COMPARISON:  None.  FINDINGS: Uterus  Measurements: 8.7 x 5.0 x 5.8 cm = volume: 131 mL. Anteverted. Heterogeneous myometrium. Tiny subserosal leiomyoma at anterior mid uterus at 9 x 9 x 13 mm. No additional discrete uterine mass.  Endometrium  Thickness: 4 mm.  No endometrial fluid or focal abnormality  Right ovary  Measurements: 1.7 x 1.2  x 1.2 cm = volume: 1.2 mL. Normal morphology without mass  Left ovary  Measurements: 2.4 x 2.0 x 1.4 cm = volume: 3.5 mL. Dominant follicle without mass  Other findings:  No free pelvic fluid.  No adnexal masses.  IMPRESSION: Tiny subserosal leiomyoma at mid anterior mid uterus 13 mm greatest size.  Otherwise negative exam.   11/20/2019 ENDOMETRIUM, BIOPSY:  - Benign endometrial polyp  - Negative for hyperplasia or malignancy   Assessment & Plan:  AUB- bleeding well controlled on Megace. Polyp on surg path  Keep Megace 40mg  bid  F/u in 3 months or sooner prn  Chart reviewed.   Total face-to-face time with patient was 20 min.  Greater than 50% was spent in counseling and coordination of care with the patient.   Nhyira Leano L. Harraway-Smith, M.D., 12/07/2019

## 2020-02-09 NOTE — Addendum Note (Signed)
Addended by: Willodean Rosenthal on: 02/09/2020 11:17 AM   Modules accepted: Orders

## 2020-02-19 IMAGING — MR MR LUMBAR SPINE W/O CM
4 of 5 series · 15 of 48 positions shown · non-contrast
Comparison: Report from a lumbar spine MRI 03/31/2016 (images
unavailable). Lumbar spine MRI 06/11/2009, radiographs of the lumbar
spine 03/06/2016

CLINICAL DATA: Low back pain, unspecified back pain laterality,
unspecified chronicity, unspecified whether sciatica present.
Additional history provided by scanning [HOSPITAL] back pain,
sciatica.

EXAM:
MRI LUMBAR SPINE WITHOUT CONTRAST
TECHNIQUE: Multiplanar, multisequence MR imaging of the lumbar spine was
performed. No intravenous contrast was administered.

[Series 3: T2 · sagittal · 4.0mm · 0.59mm/px · 6 of 15 slices shown (1 of 2)]
[im 1/15]
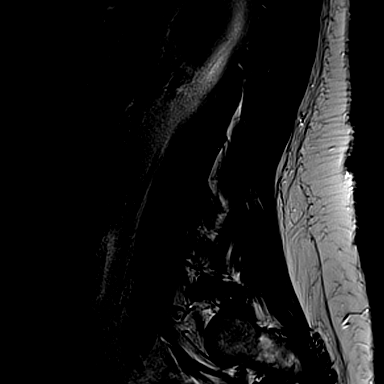
[im 3/15]
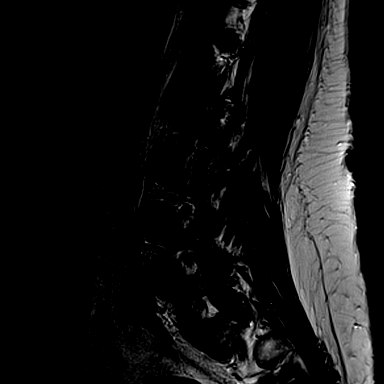
[im 6/15]
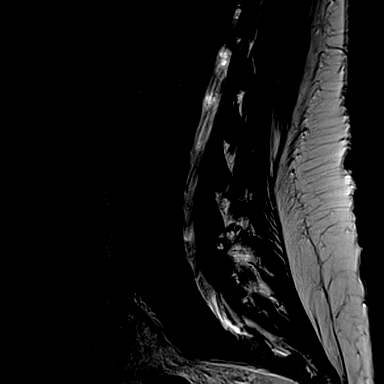
[im 9/15]
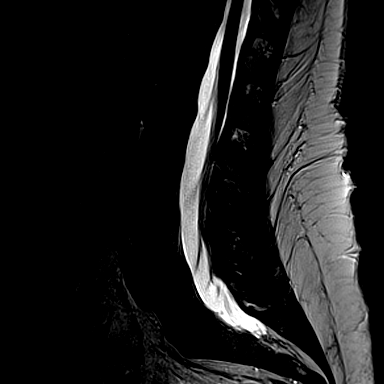
[im 12/15]
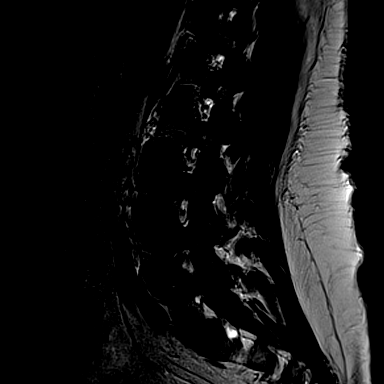
[im 15/15]
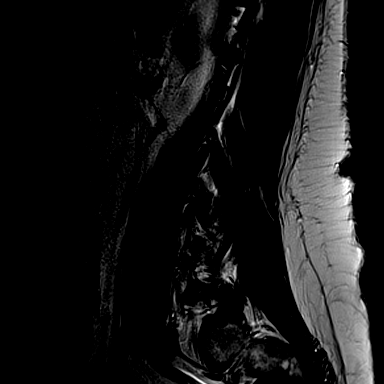

[Series 4: T1 · sagittal · 4.0mm · 0.30mm/px · 3 of 15 slices shown (1 of 2)]
[im 3/15]
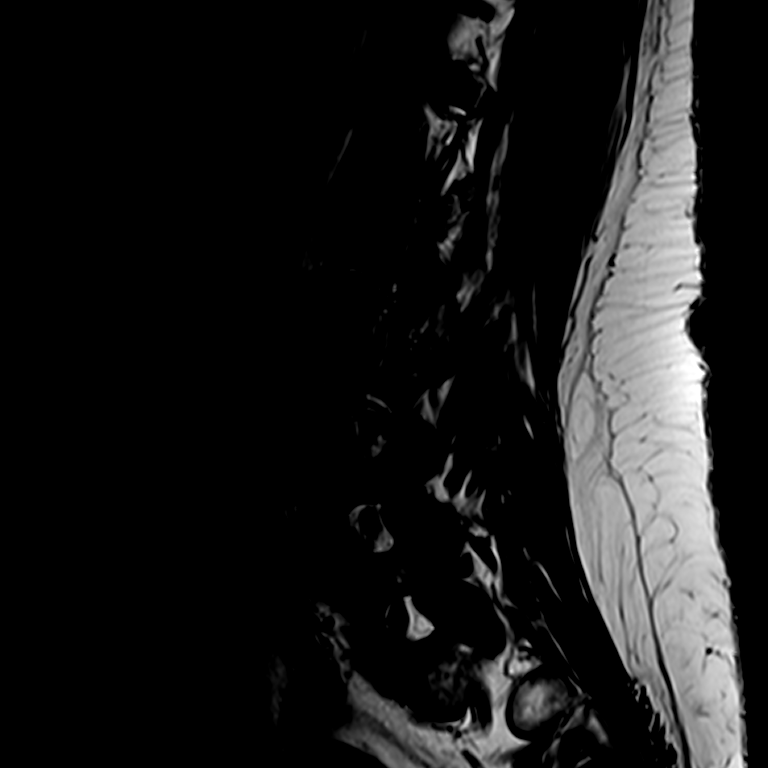
[im 9/15]
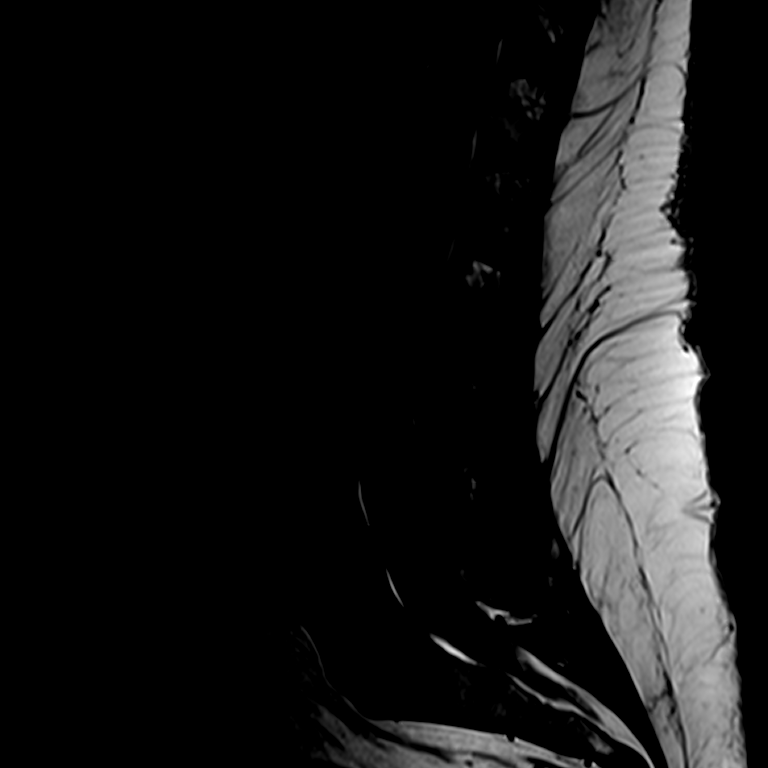
[im 15/15]
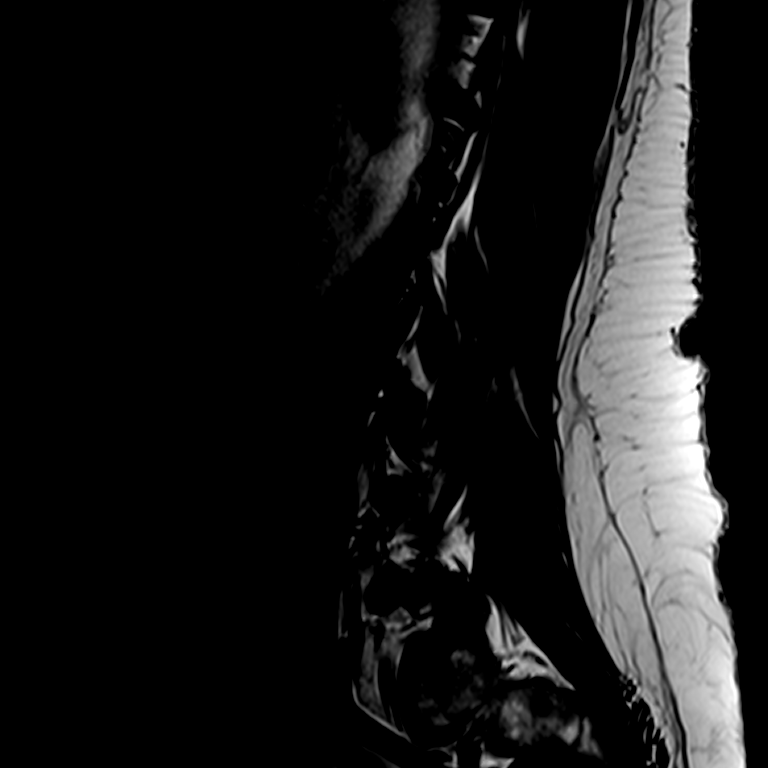

[Series 6: T2 · axial · 4.0mm · 0.24mm/px · z∈[-117,+22]mm · 3 of 40 slices shown (2 of 2)]
[im 6/40]
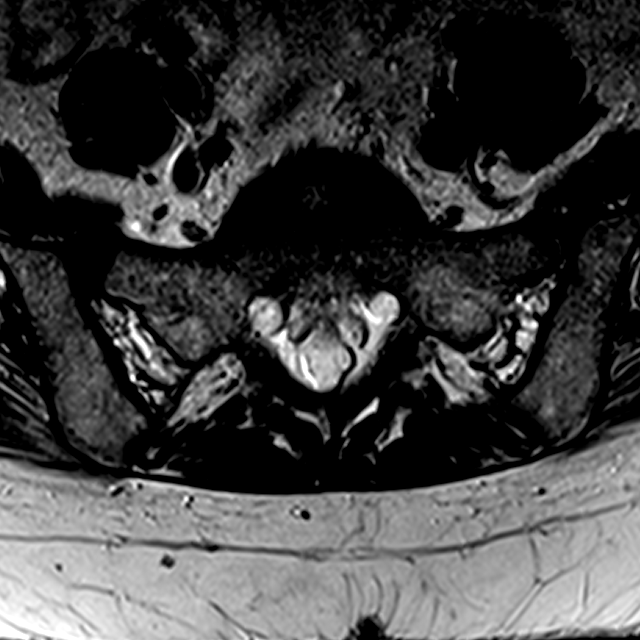
[im 20/40]
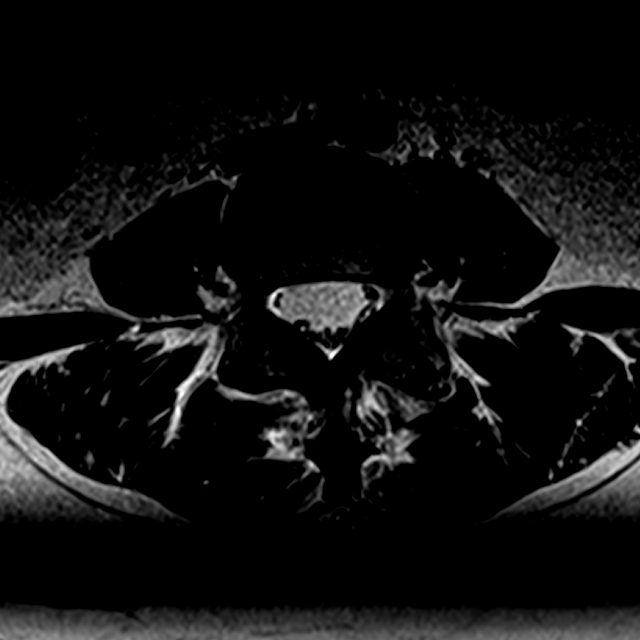
[im 34/40]
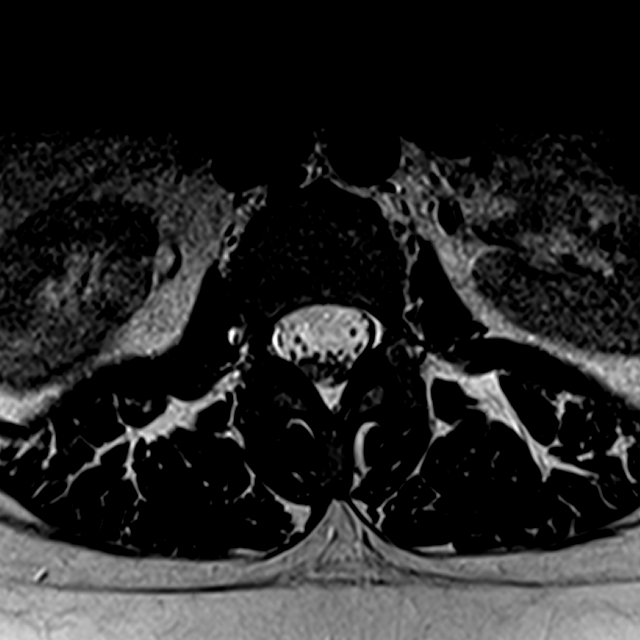

[Series 7: T1 · axial · 4.0mm · 0.24mm/px · z∈[-117,+22]mm · 3 of 40 slices shown (2 of 2)]
[im 6/40]
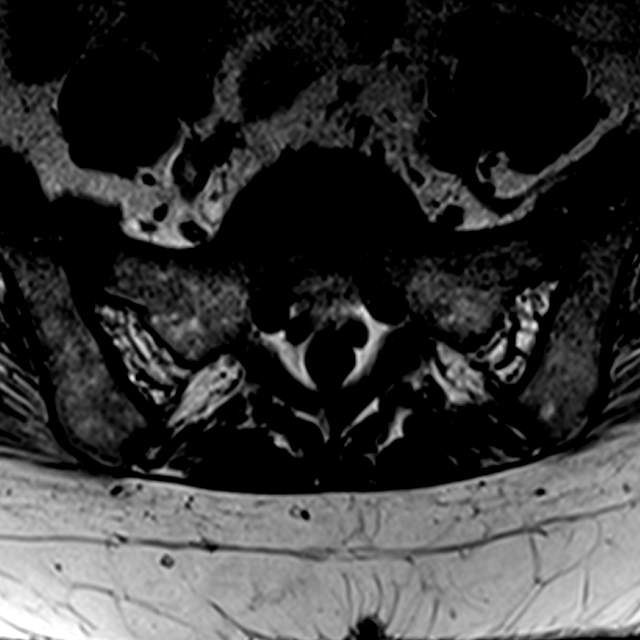
[im 20/40]
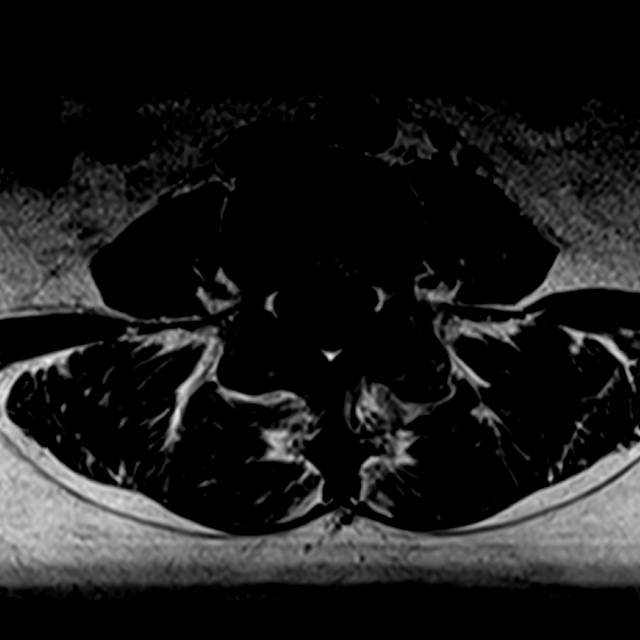
[im 34/40]
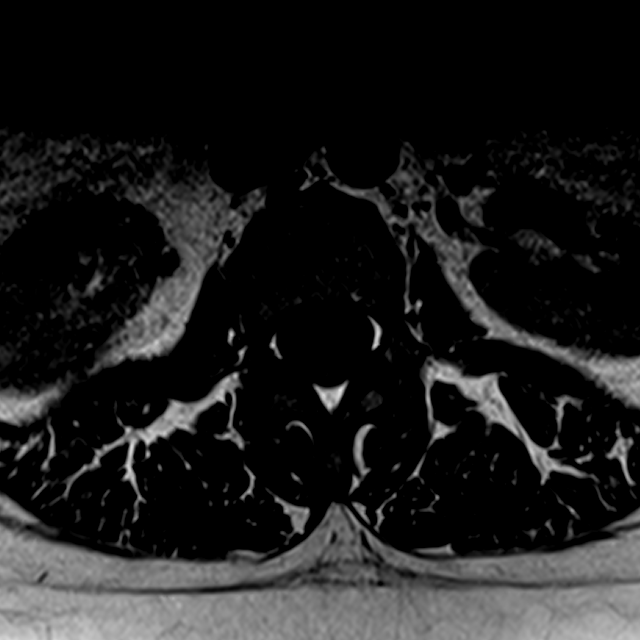

[15 of 48 positions shown; findings below may reference images not displayed]

FINDINGS: Segmentation: 5 lumbar vertebrae

Alignment:  No significant spondylolisthesis.

Vertebrae: Vertebral body height is maintained. No marrow edema or
suspicious osseous lesion.

Conus medullaris and cauda equina: Conus extends to the L2 level.
Thin T1 hyperintensity within the midline dorsal thecal sac at the
L4-L5 level, likely reflecting a small fibrolipoma of the filum
terminale.

Paraspinal and other soft tissues: No abnormality identified within
included portions of the abdomen/retroperitoneum. Paraspinal soft
tissues within normal limits.

Disc levels:

Intervertebral disc height preserved throughout the lumbar spine.

T12-L1: This level is not included on axial imaging. No disc
herniation. No significant canal or foraminal stenosis.

L1-L2: Minimal disc bulge. Mild facet arthrosis. No significant
spinal canal or neural foraminal narrowing.

L2-L3: Mild facet arthrosis/ligamentum flavum hypertrophy. No disc
herniation. No significant canal or foraminal stenosis.

L3-L4: Moderate facet arthrosis (predominantly on the left) with
ligamentum flavum hypertrophy. No significant disc herniation,
spinal canal stenosis or neural foraminal narrowing.

L4-L5: Moderate facet arthrosis with ligamentum flavum hypertrophy.
No significant disc herniation, spinal canal stenosis or neural
foraminal narrowing.

L5-S1: Moderate facet arthrosis with ligamentum flavum hypertrophy.
No significant disc herniation, spinal canal stenosis or neural
foraminal narrowing.
IMPRESSION: Lumbar spondylosis as outlined.

No significant disc herniation, spinal canal stenosis or neural
foraminal narrowing at any level.

Multilevel facet arthrosis, greatest at the L3-L4 through L5-S1
levels.

## 2020-02-23 ENCOUNTER — Ambulatory Visit: Payer: 59 | Admitting: Allergy & Immunology

## 2020-03-07 ENCOUNTER — Telehealth: Payer: Self-pay

## 2020-03-07 NOTE — Telephone Encounter (Signed)
Patient has been taking Megace for three months. Patient states within the last month she has been having sweats, cant sleep at night, and loss of sex drive. Patient is still having heavy bleeding with the megace as well.  Will route to provider for input. Armandina Stammer RN

## 2020-03-08 ENCOUNTER — Ambulatory Visit (INDEPENDENT_AMBULATORY_CARE_PROVIDER_SITE_OTHER): Payer: 59

## 2020-03-08 DIAGNOSIS — J309 Allergic rhinitis, unspecified: Secondary | ICD-10-CM

## 2020-03-20 ENCOUNTER — Ambulatory Visit (INDEPENDENT_AMBULATORY_CARE_PROVIDER_SITE_OTHER): Payer: 59

## 2020-03-20 DIAGNOSIS — J309 Allergic rhinitis, unspecified: Secondary | ICD-10-CM | POA: Diagnosis not present

## 2020-03-25 ENCOUNTER — Other Ambulatory Visit: Payer: Self-pay

## 2020-03-25 ENCOUNTER — Encounter: Payer: Self-pay | Admitting: Obstetrics & Gynecology

## 2020-03-25 ENCOUNTER — Ambulatory Visit (INDEPENDENT_AMBULATORY_CARE_PROVIDER_SITE_OTHER): Payer: 59 | Admitting: Obstetrics & Gynecology

## 2020-03-25 VITALS — BP 127/105 | HR 77 | Ht 63.0 in | Wt 244.0 lb

## 2020-03-25 DIAGNOSIS — N939 Abnormal uterine and vaginal bleeding, unspecified: Secondary | ICD-10-CM | POA: Diagnosis not present

## 2020-03-25 MED ORDER — NORETHIN ACE-ETH ESTRAD-FE 1.5-30 MG-MCG PO TABS: 1.0000 | ORAL_TABLET | Freq: Every day | ORAL | 11 refills | Status: DC

## 2020-03-25 NOTE — Progress Notes (Signed)
Patient following up from beginning megace. Patient is complaining of night sweats, hair loss, fatigue, and loss of sexual drive. Patient recently had surgery on her right foot.  Armandina Stammer RN

## 2020-03-25 NOTE — Patient Instructions (Signed)
MyFitnessPal APP  Look up "chair exercises for weight loss" on YouTube

## 2020-03-25 NOTE — Progress Notes (Signed)
History:  49 y.o. [id number] here today for f/u of AUB. Pt reports that the bleeding has improved but, the cramping has gotten really bad. She also feels that her mood is depressed.  [name] developing hot flushes that mainly affect her upper chest and head and neck and she feel that her hair is falling out. [name] also concerned about weight gain. She prev had AUB and it has improved on the Megace so she is worried about stopping it. She recently has surgery on her right foot.    The following portions of the patient's history were reviewed and updated as appropriate: allergies, current medications, past family history, past medical history, past social history, past surgical history and problem list.  Review of Systems:  Pertinent items are noted in HPI.    Objective:  Physical Exam Blood pressure (!) 127/105, pulse 77, height 5\' 3"  (1.6 m), weight 244 lb (110.7 kg).  CONSTITUTIONAL: Well-developed, well-nourished female in no acute distress.  HENT:  Normocephalic, atraumatic EYES: Conjunctivae and EOM are normal. No scleral icterus.  NECK: Normal range of motion SKIN: Skin is warm and dry. No rash noted. Not diaphoretic.No pallor. NEUROLGIC: Alert and oriented to person, place, and time. Normal coordination.    Assessment & Plan:  AUB and cramping now assoc with perimenopausal sx.   Chair exercises for weight loss  MyFitnessPal app  Stop Megace  LoEstrin 1.5/30 1 po q day  Gratitude exercises  F/u in 3 months or sooner prn   Total face-to-face time with patient was 25 min.  Greater than 50% was spent in counseling and coordination of care with the patient.   Orva Gwaltney L. Harraway-Smith, M.D., 10-09-1981

## 2020-03-29 ENCOUNTER — Ambulatory Visit (INDEPENDENT_AMBULATORY_CARE_PROVIDER_SITE_OTHER): Payer: 59

## 2020-03-29 DIAGNOSIS — J309 Allergic rhinitis, unspecified: Secondary | ICD-10-CM

## 2020-04-01 ENCOUNTER — Encounter: Payer: Self-pay | Admitting: *Deleted

## 2020-04-02 ENCOUNTER — Ambulatory Visit (INDEPENDENT_AMBULATORY_CARE_PROVIDER_SITE_OTHER): Payer: 59 | Admitting: Cardiology

## 2020-04-02 ENCOUNTER — Encounter: Payer: Self-pay | Admitting: Cardiology

## 2020-04-02 VITALS — BP 122/82 | HR 74 | Ht 63.0 in | Wt 243.0 lb

## 2020-04-02 DIAGNOSIS — I10 Essential (primary) hypertension: Secondary | ICD-10-CM | POA: Diagnosis not present

## 2020-04-02 DIAGNOSIS — R011 Cardiac murmur, unspecified: Secondary | ICD-10-CM

## 2020-04-02 DIAGNOSIS — Z87898 Personal history of other specified conditions: Secondary | ICD-10-CM | POA: Diagnosis not present

## 2020-04-02 NOTE — Patient Instructions (Addendum)
Your physician recommends that you schedule a follow-up appointment in: PENDING WITH DR MCDOWELL  Your physician recommends that you continue on your current medications as directed. Please refer to the Current Medication list given to you today.  Your physician has requested that you have an echocardiogram. Echocardiography is a painless test that uses sound waves to create images of your heart. It provides your doctor with information about the size and shape of your heart and how well your heart's chambers and valves are working. This procedure takes approximately one hour. There are no restrictions for this procedure.  Thank you for choosing [facility]!!    

## 2020-04-02 NOTE — Progress Notes (Signed)
Cardiology Office Note  Date: 04/02/2020   ID: [name], DOB Aug 01, 1970, MRN [id number]  PCP:  Kirstie Peri, MD  Cardiologist:  Nona Dell, MD Electrophysiologist:  None   Chief Complaint  Patient presents with   History of chest pain    History of Present Illness: Leah Phillips is a 49 y.o. female referred for cardiology consultation by Dr. Sherryll Burger for the evaluation of chest pain.  I reviewed the available records.  My understanding is that she is being considered for resumption of phentermine for weight loss.  She reports having trouble with chest pain and also cramping in her arms and legs on evaluation earlier in the year when she was noted to be significantly hypokalemic.  Since she has been on higher dose potassium supplement (with continued HCTZ for treatment of hypertension), she has had no further symptoms.  She tells me that she took phentermine for about a year, lost 53 pounds and tolerated it well.  Faxed copy of ECG from PCP office shows a sinus rhythm with nonspecific T wave changes.  I reviewed her current medications which are listed below.  Past Medical History:  Diagnosis Date   Arthritis    Carpal tunnel syndrome    Essential hypertension    GERD (gastroesophageal reflux disease)    History of UTI    Low back pain    Obesity    Urticaria     Past Surgical History:  Procedure Laterality Date   ANTERIOR CRUCIATE LIGAMENT REPAIR Left    CARPAL TUNNEL RELEASE Right 04/01/2015   Procedure: Right Carpal Tunnel Release;  Surgeon: Eldred Manges, MD;  Location: [facility];  Service: Orthopedics;  Laterality: Right;   CHOLECYSTECTOMY     EXCISION METACARPAL MASS Right 04/01/2015   Procedure: Excision Right Long Finger Mass;  Surgeon: Eldred Manges, MD;  Location: [facility];  Service: Orthopedics;  Laterality: Right;    Current Outpatient Medications  Medication Sig Dispense Refill   atenolol (TENORMIN)  50 MG tablet Take 25 mg by mouth daily.   1   Biotin 5000 MCG CAPS Take 1 capsule by mouth daily.     cholecalciferol (VITAMIN D3) 25 MCG (1000 UNIT) tablet Take 1,000 Units by mouth daily.     cyclobenzaprine (FLEXERIL) 10 MG tablet Take 1 tablet (10 mg total) by mouth at bedtime. 30 tablet 0   famotidine (PEPCID) 20 MG tablet TAKE 1 TABLET BY MOUTH EVERYDAY AT BEDTIME 90 tablet 1   hydrochlorothiazide (HYDRODIURIL) 12.5 MG tablet   2   ibuprofen (ADVIL) 800 MG tablet TAKE 1 TABLET BY MOUTH TWICE A DAY WITH MEALS 180 tablet 1   levocetirizine (XYZAL) 5 MG tablet Take 5 mg by mouth every evening.     montelukast (SINGULAIR) 10 MG tablet TAKE 1 TABLET BY MOUTH EVERYDAY AT BEDTIME 90 tablet 1   Multiple Vitamin (MULTI-VITAMINS) TABS Take by mouth.     potassium chloride (K-DUR) 10 MEQ tablet   1   Current Facility-Administered Medications  Medication Dose Route Frequency Provider Last Rate Last Admin   meloxicam (MOBIC) tablet 15 mg  15 mg Oral Daily Eldred Manges, MD       Allergies:  Orange fruit [citrus], Amoxicillin, Pecan nut (diagnostic), and Penicillins   Social History: The patient  reports that she has never smoked. She has never used smokeless tobacco. She reports current alcohol use. She reports that she does not use drugs.   Family  History: The patient's family history includes Allergic rhinitis in her mother; Asthma in her sister.   ROS: No palpitations or syncope.  Physical Exam: VS:  BP 122/82    Pulse 74    Ht 5\' 3"  (1.6 m)    Wt 243 lb (110.2 kg)    SpO2 98%    BMI 43.05 kg/m , BMI Body mass index is 43.05 kg/m.  Wt Readings from Last 3 Encounters:  04/02/20 243 lb (110.2 kg)  03/25/20 244 lb (110.7 kg)  02/09/20 234 lb (106.1 kg)    General: Patient appears comfortable at rest. HEENT: Conjunctiva and lids normal, wearing a mask. Neck: Supple, no elevated JVP or carotid bruits, no thyromegaly. Lungs: Clear to auscultation, nonlabored breathing at  rest. Cardiac: Regular rate and rhythm, no S3, soft basal systolic murmur, no pericardial rub. Abdomen: Soft, nontender, bowel sounds present. Extremities: No pitting edema, distal pulses 2+. Skin: Warm and dry. Musculoskeletal: No kyphosis. Neuropsychiatric: Alert and oriented x3, affect grossly appropriate.  ECG:  An ECG dated 04/18/2009 was personally reviewed today and demonstrated:  Sinus rhythm with nonspecific T wave changes.  Recent Labwork: 11/17/2019: ALT 12; AST 16; BUN 11; Creatinine, Ser 0.79; Potassium 4.0; Sodium 142  August 2021: Hemoglobin 13.4, platelets 355, BUN 13, creatinine 0.8, potassium 3.5, AST 17, ALT 11  Other Studies Reviewed Today:  No prior cardiac testing for review today.  Assessment and Plan:  1.  Basal systolic heart murmur, most likely benign however not previously investigated by echocardiogram.  She does have a history of hypertension, blood pressure is well controlled.  We will obtain an echocardiogram to ensure normal cardiac structure and function.  2.  History of chest pain, presently resolved and following treatment for hypokalemia.  She does not report any major functional limitation at this time, no exertional chest pain.  ECG nonspecific.  No further ischemic testing is being pursued at this time.  No direct cardiac contraindication for her to use phentermine, she tolerated it well by report in the past.  3.  Essential hypertension, currently on atenolol and HCTZ.  Remains on potassium supplement.  Last potassium was 3.5.  Medication Adjustments/Labs and Tests Ordered: Current medicines are reviewed at length with the patient today.  Concerns regarding medicines are outlined above.   Tests Ordered: Orders Placed This Encounter  Procedures   ECHOCARDIOGRAM COMPLETE    Medication Changes: No orders of the defined types were placed in this encounter.   Disposition:  Follow up test results.  Signed, September 2021, MD,  [facility] 04/02/2020 1:35 PM    [facility] at [facility] [address] [place], [place], [place] Phone: [phone]; Fax: [phone]

## 2020-04-05 ENCOUNTER — Ambulatory Visit (INDEPENDENT_AMBULATORY_CARE_PROVIDER_SITE_OTHER): Payer: 59

## 2020-04-05 DIAGNOSIS — J309 Allergic rhinitis, unspecified: Secondary | ICD-10-CM

## 2020-04-17 ENCOUNTER — Ambulatory Visit (INDEPENDENT_AMBULATORY_CARE_PROVIDER_SITE_OTHER): Payer: 59

## 2020-04-17 DIAGNOSIS — J309 Allergic rhinitis, unspecified: Secondary | ICD-10-CM | POA: Diagnosis not present

## 2020-04-22 ENCOUNTER — Ambulatory Visit: Payer: 59 | Admitting: Cardiology

## 2020-04-23 ENCOUNTER — Ambulatory Visit (INDEPENDENT_AMBULATORY_CARE_PROVIDER_SITE_OTHER): Payer: 59

## 2020-04-23 DIAGNOSIS — R011 Cardiac murmur, unspecified: Secondary | ICD-10-CM | POA: Diagnosis not present

## 2020-04-23 LAB — ECHOCARDIOGRAM COMPLETE
Area-P 1/2: 3.06 cm2
Calc EF: 61.5 %
MV M vel: 2.29 m/s
MV Peak grad: 20.9 mmHg
S' Lateral: 2.57 cm
Single Plane A2C EF: 57.8 %
Single Plane A4C EF: 63.3 %

## 2020-04-24 ENCOUNTER — Ambulatory Visit (INDEPENDENT_AMBULATORY_CARE_PROVIDER_SITE_OTHER): Payer: 59

## 2020-04-24 ENCOUNTER — Telehealth: Payer: Self-pay | Admitting: *Deleted

## 2020-04-24 DIAGNOSIS — J309 Allergic rhinitis, unspecified: Secondary | ICD-10-CM

## 2020-04-24 NOTE — Telephone Encounter (Signed)
-----   Message from Jonelle Sidle, MD sent at 04/23/2020 12:39 PM EST ----- Results reviewed.  LVEF normal at 55 to 60%, no significant valvular abnormalities.  Cardiac murmur is benign.  No further cardiac testing planned at this point.  Keep follow-up with PCP.

## 2020-04-24 NOTE — Telephone Encounter (Signed)
Patient informed. Copy sent to PCP °

## 2020-05-01 ENCOUNTER — Ambulatory Visit (INDEPENDENT_AMBULATORY_CARE_PROVIDER_SITE_OTHER): Payer: 59

## 2020-05-01 DIAGNOSIS — J309 Allergic rhinitis, unspecified: Secondary | ICD-10-CM | POA: Diagnosis not present

## 2020-05-08 ENCOUNTER — Other Ambulatory Visit: Payer: 59

## 2020-05-10 ENCOUNTER — Ambulatory Visit (INDEPENDENT_AMBULATORY_CARE_PROVIDER_SITE_OTHER): Payer: 59

## 2020-05-10 DIAGNOSIS — J309 Allergic rhinitis, unspecified: Secondary | ICD-10-CM

## 2020-05-21 ENCOUNTER — Other Ambulatory Visit: Payer: Self-pay | Admitting: Allergy & Immunology

## 2020-05-22 ENCOUNTER — Ambulatory Visit (INDEPENDENT_AMBULATORY_CARE_PROVIDER_SITE_OTHER): Payer: 59

## 2020-05-22 ENCOUNTER — Telehealth: Payer: Self-pay | Admitting: Obstetrics & Gynecology

## 2020-05-22 DIAGNOSIS — J309 Allergic rhinitis, unspecified: Secondary | ICD-10-CM

## 2020-05-22 NOTE — Telephone Encounter (Signed)
TC to pt.Returning her query from a MyChart message. Left message to call back.   Dr. Erin Fulling

## 2020-05-29 ENCOUNTER — Ambulatory Visit (INDEPENDENT_AMBULATORY_CARE_PROVIDER_SITE_OTHER): Payer: 59

## 2020-05-29 DIAGNOSIS — J309 Allergic rhinitis, unspecified: Secondary | ICD-10-CM

## 2020-06-05 ENCOUNTER — Ambulatory Visit (INDEPENDENT_AMBULATORY_CARE_PROVIDER_SITE_OTHER): Payer: 59

## 2020-06-05 DIAGNOSIS — J309 Allergic rhinitis, unspecified: Secondary | ICD-10-CM

## 2020-06-14 ENCOUNTER — Ambulatory Visit (INDEPENDENT_AMBULATORY_CARE_PROVIDER_SITE_OTHER): Payer: 59

## 2020-06-14 DIAGNOSIS — J309 Allergic rhinitis, unspecified: Secondary | ICD-10-CM

## 2020-07-03 DIAGNOSIS — M25774 Osteophyte, right foot: Secondary | ICD-10-CM | POA: Insufficient documentation

## 2020-07-10 ENCOUNTER — Encounter: Payer: Self-pay | Admitting: Family

## 2020-07-10 ENCOUNTER — Ambulatory Visit: Payer: Self-pay

## 2020-07-10 ENCOUNTER — Ambulatory Visit (INDEPENDENT_AMBULATORY_CARE_PROVIDER_SITE_OTHER): Payer: 59 | Admitting: Family

## 2020-07-10 ENCOUNTER — Other Ambulatory Visit: Payer: Self-pay

## 2020-07-10 VITALS — BP 120/88 | HR 69 | Temp 97.7°F | Resp 18 | Ht 63.0 in | Wt 232.0 lb

## 2020-07-10 DIAGNOSIS — J3089 Other allergic rhinitis: Secondary | ICD-10-CM | POA: Diagnosis not present

## 2020-07-10 DIAGNOSIS — L500 Allergic urticaria: Secondary | ICD-10-CM

## 2020-07-10 DIAGNOSIS — J302 Other seasonal allergic rhinitis: Secondary | ICD-10-CM | POA: Diagnosis not present

## 2020-07-10 DIAGNOSIS — J309 Allergic rhinitis, unspecified: Secondary | ICD-10-CM

## 2020-07-10 MED ORDER — FLUTICASONE PROPIONATE 50 MCG/ACT NA SUSP: 1.0000 | Freq: Every day | NASAL | 5 refills | Status: DC | PRN

## 2020-07-10 NOTE — Progress Notes (Addendum)
[address] [place] [address] Dept: [phone]  FOLLOW UP NOTE  Patient ID: Leah Phillips, female    DOB: 22-Feb-1971  Age: 50 y.o. MRN: [id number] Date of Office Visit: 07/10/2020  Assessment  Chief Complaint: Allergic Rhinitis  and Nasal Congestion  HPI Leah Phillips is a 50 year old female who presents today for an acute visit.  She was last seen on February 07, 2020 by Nehemiah Settle, FNP for seasonal and perennial allergic rhinitis, and allergic urticaria.  Seasonal and perennial allergic rhinitis is reported as not well controlled with environmental allergy injections per protocol, Allegra 180 mg once a day, and Singulair 10 mg once a day.  She reports since having COVID-19 on January 6 she has this sensation of congestion in her sinus area that will occasionally drip but she is not able to get it loose.  She has tried a medication that is like Afrin and this will only help for a little bit.  She denies any rhinorrhea, and nasal congestion.  She denies large local reactions with allergy injections and does feel that day are helping.She reports that she has used a nasal spray approximately 10 years ago and this caused vertigo, but she is willing to try a nose spray if this will help her.  Allergic urticaria is reported as controlled with Allegra 180 mg once a day, Pepcid 20 mg at night, and Singulair 10 mg once a day.  She reports that she has not had any hives and is doing great   Drug Allergies:  Allergies  Allergen Reactions  . 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 Amoxicillin   . Pecan Nut (Diagnostic) Other (See Comments)    Nuts- tested  . Penicillins Hives    Review of Systems: Review of Systems  Constitutional: Negative for chills and fever.  HENT:       Denies rhinorrhea and nasal congestion.  Reports feeling congestion in her sinus area  Eyes:       Denies itchy watery eyes  Respiratory: Negative for cough, shortness of breath and wheezing.    Cardiovascular: Negative for chest pain and palpitations.  Gastrointestinal: Negative for abdominal pain and heartburn.  Genitourinary: Negative for dysuria.  Skin: Positive for itching. Negative for rash.       Reports occasional itching at times, but no rashes or hives  Neurological: Negative for headaches.  Endo/Heme/Allergies: Positive for environmental allergies.    Physical Exam: BP 120/88 (BP Location: Left Arm, Patient Position: Sitting, Cuff Size: Large)   Pulse 69   Temp 97.7 F (36.5 C) (Temporal)   Resp 18   Ht 5\' 3"  (1.6 m)   Wt 232 lb (105.2 kg)   SpO2 98%   BMI 41.10 kg/m    Physical Exam Constitutional:      Appearance: Normal appearance.  HENT:     Head: Normocephalic and atraumatic.     Comments: Pharynx normal, eyes normal, ears normal, nose bilateral lower turbinates moderately edematous and slightly erythematous with no drainage noted.  Left nostril greater than right nostril    Right Ear: Tympanic membrane, ear canal and external ear normal.     Left Ear: Tympanic membrane, ear canal and external ear normal.     Mouth/Throat:     Mouth: Mucous membranes are moist.     Pharynx: Oropharynx is clear.  Eyes:     Conjunctiva/sclera: Conjunctivae normal.  Cardiovascular:     Rate and Rhythm: Regular rhythm.  Heart sounds: Normal heart sounds.  Pulmonary:     Effort: Pulmonary effort is normal.     Breath sounds: Normal breath sounds.     Comments: Lungs clear to auscultation Musculoskeletal:     Cervical back: Neck supple.  Skin:    General: Skin is warm.     Comments: No rashes or urticarial lesions noted  Neurological:     Mental Status: [name] alert and oriented to person, place, and time.  Psychiatric:        Mood and Affect: Mood normal.        Behavior: Behavior normal.        Thought Content: Thought content normal.        Judgment: Judgment normal.     Diagnostics: None  Assessment and Plan: 1. Seasonal and perennial allergic  rhinitis   2. Allergic urticaria     Meds ordered this encounter  Medications  . fluticasone (FLONASE) 50 MCG/ACT nasal spray    Sig: Place 1-2 sprays into both nostrils daily as needed for allergies or rhinitis.    Dispense:  16 g    Refill:  5    Patient Instructions  Seasonal and perennial allergic rhinitis (grass, ragweed, weeds, trees, molds, dust mite, cat, cockroach) Start fluticasone nasal spray 1-2 sprays each nostril once a day as needed for nasal congestion Continue allergy injections once a week Continue Allegra 180 mg at night to help with runny nose and itching Continue Singulair 10 mg at night Start saline nasal rinses once a day to help with nasal symptoms/drainage  Allergic urticaria Continue Allegra 180 mg at night to help with itching Continue Pepcid 20 mg 1 tablet at night to help with itching Continue Singulair 10 mg 1 tablet at night to help with itching   Please let us know if this treatment plan is not working well for you. Schedule follow-up appointment in 4 weeks     Return in about 4 weeks (around 08/07/2020), or if symptoms worsen or fail to improve.    Thank you for the opportunity to care for this patient.  Please do not hesitate to contact me with questions.  Nehemiah Settle, FNP [facility]

## 2020-07-10 NOTE — Patient Instructions (Addendum)
Seasonal and perennial allergic rhinitis (grass, ragweed, weeds, trees, molds, dust mite, cat, cockroach) Start fluticasone nasal spray 1-2 sprays each nostril once a day as needed for nasal congestion Stop the medication that is like Afrin. Continue allergy injections once a week Continue Allegra 180 mg at night to help with runny nose and itching Continue Singulair 10 mg at night Start saline nasal rinses once a day to help with nasal symptoms/drainage  Allergic urticaria Continue Allegra 180 mg at night to help with itching Continue Pepcid 20 mg 1 tablet at night to help with itching Continue Singulair 10 mg 1 tablet at night to help with itching   Please let us know if this treatment plan is not working well for you. Schedule follow-up appointment in 4 weeks

## 2020-07-19 ENCOUNTER — Ambulatory Visit (INDEPENDENT_AMBULATORY_CARE_PROVIDER_SITE_OTHER): Payer: 59

## 2020-07-19 DIAGNOSIS — L500 Allergic urticaria: Secondary | ICD-10-CM | POA: Diagnosis not present

## 2020-07-21 ENCOUNTER — Other Ambulatory Visit: Payer: Self-pay | Admitting: Allergy & Immunology

## 2020-07-24 ENCOUNTER — Ambulatory Visit (INDEPENDENT_AMBULATORY_CARE_PROVIDER_SITE_OTHER): Payer: 59

## 2020-07-24 DIAGNOSIS — J309 Allergic rhinitis, unspecified: Secondary | ICD-10-CM

## 2020-08-07 ENCOUNTER — Ambulatory Visit: Payer: 59 | Admitting: Family

## 2020-08-09 ENCOUNTER — Other Ambulatory Visit: Payer: Self-pay

## 2020-08-09 ENCOUNTER — Ambulatory Visit (INDEPENDENT_AMBULATORY_CARE_PROVIDER_SITE_OTHER): Payer: 59 | Admitting: Allergy & Immunology

## 2020-08-09 ENCOUNTER — Encounter: Payer: Self-pay | Admitting: Allergy & Immunology

## 2020-08-09 DIAGNOSIS — J3089 Other allergic rhinitis: Secondary | ICD-10-CM

## 2020-08-09 DIAGNOSIS — J310 Chronic rhinitis: Secondary | ICD-10-CM

## 2020-08-09 DIAGNOSIS — L500 Allergic urticaria: Secondary | ICD-10-CM

## 2020-08-09 DIAGNOSIS — J302 Other seasonal allergic rhinitis: Secondary | ICD-10-CM

## 2020-08-09 MED ORDER — AZELASTINE HCL 0.15 % NA SOLN: 2.0000 | Freq: Two times a day (BID) | NASAL | 5 refills | Status: AC

## 2020-08-09 NOTE — Progress Notes (Signed)
RE: Leah Phillips MRN: [id number] DOB: November 13, 1970 Date of Telemedicine Visit: 08/09/2020  Referring provider: Kirstie Peri, MD Primary care provider: Kirstie Peri, MD  Chief Complaint: Urticaria (Doing good. No issues. Allergies not so much. She went to [place] 2/17 and was back on 2/22. She has been having itching, no hives. Her face is itchy and burning. 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 She wonders if should see ent due to her nasal drainage. Her sinus rinse and nasal spray does not seem to be working. )   Telemedicine Follow Up Visit via Telephone: I connected with Leah Phillips for a follow up on 08/10/20 by telephone and verified that I am speaking with the correct person using two identifiers.   I discussed the limitations, risks, security and privacy concerns of performing an evaluation and management service by telephone and the availability of in person appointments. I also discussed with the patient that there may be a patient responsible charge related to this service. The patient expressed understanding and agreed to proceed.  Patient is at home.  Provider is at the office.  Visit start time: 3:28 PM Visit end time: 3:58 PM Insurance consent/check in by: [facility] Medical consent and medical assistant/nurse: Morrie Sheldon  History of Present Illness:  Leah Phillips is a 50 y.o. female presenting for a follow up visit. She was last seen in February 2022.  At that time, her allergies seem to be under poor control.  She was started on Flonase 1 to 2 sprays per nostril daily.  She was continued on Allegra and Singulair.  She was also continued on her allergen immunotherapy.  Her urticaria was controlled with Allegra at night as well as Pepcid at night and Singulair at night.  Her congestion seem to be worse since contracting COVID-19 in January 2022.  We felt like a lot of her symptoms are related to that.  Since last visit, she has continued to have quite  a bit of congestion.  Allergic Rhinitis Symptom History: She has been using Flonase without much improvement. This has not helped at all. She mostly has congestion rather than rhinorrhea. She has postnasal drip and saline washes that help transiently. She switched from Allegra to Xyzal. This did help with the itching but it is not helping what is going on inside of her nose. She has had prednisone in the past that did not help. She does not use Afrin. 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  She has never seen ENT for the symptoms. She feels that her problem is mucus in the top of her throat right above her hard palate but not within the nose.  It is rather difficult for her to describe.  She denies any history of reflux and has not tried using prophylactic PPIs, although she is on Pepcid for hives.  Urticaria Symptom History: Despite the Xyzal, she continues to have some itching and hives.  [name] also on Pepcid at night and Singulair nightly.  I did review the labs we did from June.  Alpha gal panel was negative, ANA was negative, chronic urticaria panel was negative, inflammatory markers were negative, and a tryptase was negative.  Metabolic panel was normal as well.  However, we obtained basic food panel and she was positive to milk, wheat, peanut, soy, and egg.  The highest one was milk at 1.68.  The others were 0.2-0.3  and likely on relevant.  She has never tried removing dairy completely from her diet.  She does not seem happy when I suggest that, but she is open to do it for a month or two.  She really loves cheese and yogurt.   Otherwise, there have been no changes to her past medical history, surgical history, family history, or social history.     Assessment and Plan:  Leah Phillips is a 50 y.o. female with:  Seasonal and perennial allergic rhinitis (grasses, ragweed, weeds, trees, indoor molds, outdoor molds, dust mites, cat, and cockroach) - on  allergen immunotherapy  Worsening congestion since contracting COVID19 in January 2022  Allergic urticaria - attempting to maintain dairy free diet for a couple of months to see if this helps   Leah Phillips continues to have congestion despite our best efforts.  She has been on a couple of nasal steroids as well as multiple antihistamines without much relief.  Relief is only temporary at their at all.  Symptoms have slightly worsened since she contracted COVID-19 in January.  We are going to change her medications around and send her to otolaryngology for an evaluation.  Fortunately, her sister is vegan and would be more than happy to help her with her dairy avoidance, I am certain of that.   1. Seasonal and perennial allergic rhinitis (grasses, ragweed, weeds, trees, indoor molds, outdoor molds, dust mites, cat and cockroach) - Continue with shots at the same schedule. - Continue taking: Xyzal 5mg  once AT NIGHT and Singulair (montelukast) 10mg  daily AT NIGHT and Flonase one spray per nostril up to twice daily - Start taking: Astelin one spray per nostril up to twice daily (can use WITH Flonase) - You can use an extra dose of the antihistamine, if needed, for breakthrough symptoms.  - Consider nasal saline rinses 1-2 times daily to remove allergens from the nasal cavities as well as help with mucous clearance (this is especially helpful to do before the nasal sprays are given)  - We are going to refer you to see ENT for evaluation of possible anatomic reasons for your symptoms.   2. Allergic urticaria  - We will get some lab work today. - We will restart allergy shots at the Gold Vial 0.05 mL.  - In the meantime, continue with Allegra (fexofenadine) 180 mg table once AT NIGHT + Pepcid (famotidine) 20 mg AT NIGHT + Singulair (montelukast) 10 mg AT NIGHT   3. Return in about 3 months (around 11/09/2020).    Diagnostics: None.  Medication List:  Current Outpatient Medications  Medication Sig  Dispense Refill  . atenolol (TENORMIN) 50 MG tablet Take 25 mg by mouth daily.   1  . Azelastine HCl 0.15 % SOLN Place 2 sprays into both nostrils 2 (two) times daily. 30 mL 5  . Biotin 5000 MCG CAPS Take 1 capsule by mouth daily.    . chlorthalidone (HYGROTON) 25 MG tablet Take 25 mg by mouth daily.    . cholecalciferol (VITAMIN D3) 25 MCG (1000 UNIT) tablet Take 1,000 Units by mouth daily.    . cyclobenzaprine (FLEXERIL) 10 MG tablet Take 1 tablet (10 mg total) by mouth at bedtime. 30 tablet 0  . famotidine (PEPCID) 20 MG tablet TAKE 1 TABLET BY MOUTH EVERYDAY AT BEDTIME 90 tablet 1  . fluticasone (FLONASE) 50 MCG/ACT nasal spray Place 1-2 sprays into both nostrils daily as needed for allergies or rhinitis. 16 g 5  . hydrochlorothiazide (HYDRODIURIL) 12.5 MG tablet   2  .  ibuprofen (ADVIL) 800 MG tablet TAKE 1 TABLET BY MOUTH TWICE A DAY WITH MEALS 180 tablet 1  . JUNEL FE 1.5/30 1.5-30 MG-MCG tablet Take 1 tablet by mouth daily.    Marland Kitchen KLOR-CON M20 20 MEQ tablet Take 20 mEq by mouth daily.    Marland Kitchen levocetirizine (XYZAL) 5 MG tablet Take 5 mg by mouth every evening.    . montelukast (SINGULAIR) 10 MG tablet TAKE 1 TABLET BY MOUTH EVERYDAY AT BEDTIME 90 tablet 1  . Multiple Vitamin (MULTI-VITAMINS) TABS Take by mouth.    . OXYGEN Inhale 2 L into the lungs.    . pantoprazole (PROTONIX) 40 MG tablet Take 40 mg by mouth daily.    . phentermine (ADIPEX-P) 37.5 MG tablet Take 18.75 mg by mouth 2 (two) times daily.    . potassium chloride (K-DUR) 10 MEQ tablet   1   Current Facility-Administered Medications  Medication Dose Route Frequency Provider Last Rate Last Admin  . meloxicam (MOBIC) tablet 15 mg  15 mg Oral Daily Eldred Manges, MD       Allergies: Allergies  Allergen Reactions  . 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 Amoxicillin   . Pecan Nut (Diagnostic) Other (See Comments)    Nuts- tested  . Penicillins Hives   I reviewed her past medical history, social history, family history, and  environmental history and no significant changes have been reported from previous visits.  Review of Systems  Constitutional: Negative for activity change and appetite change.  HENT: Positive for congestion, postnasal drip and rhinorrhea. Negative for sinus pressure and sore throat.   Eyes: Negative for pain, discharge, redness and itching.  Respiratory: Negative for shortness of breath, wheezing and stridor.   Gastrointestinal: Negative for diarrhea, nausea and vomiting.  Musculoskeletal: Negative for arthralgias, joint swelling and myalgias.  Skin: Negative for rash.  Allergic/Immunologic: Negative for environmental allergies and food allergies.    Objective:  Physical exam not obtained as encounter was done via telephone.   Previous notes and tests were reviewed.  I discussed the assessment and treatment plan with the patient. The patient was provided an opportunity to ask questions and all were answered. The patient agreed with the plan and demonstrated an understanding of the instructions.   The patient was advised to call back or seek an in-person evaluation if the symptoms worsen or if the condition fails to improve as anticipated.  I provided 20 minutes of non-face-to-face time during this encounter.  It was my pleasure to participate in [place] care today. Please feel free to contact me with any questions or concerns.   Sincerely,  Alfonse Spruce, MD

## 2020-08-09 NOTE — Patient Instructions (Addendum)
1. Seasonal and perennial allergic rhinitis (grasses, ragweed, weeds, trees, indoor molds, outdoor molds, dust mites, cat and cockroach) - Continue with shots at the same schedule. - Continue taking: Xyzal 5mg  once AT NIGHT and Singulair (montelukast) 10mg  daily AT NIGHT and Flonase one spray per nostril up to twice daily - Start taking: Astelin one spray per nostril up to twice daily (can use WITH Flonase) - You can use an extra dose of the antihistamine, if needed, for breakthrough symptoms.  - Consider nasal saline rinses 1-2 times daily to remove allergens from the nasal cavities as well as help with mucous clearance (this is especially helpful to do before the nasal sprays are given)  - We are going to refer you to see ENT for evaluation of possible anatomic reasons for your symptoms.   2. Allergic urticaria  - We will get some lab work today. - We will restart allergy shots at the Gold Vial 0.05 mL.  - In the meantime, continue with Allegra (fexofenadine) 180 mg table once AT NIGHT + Pepcid (famotidine) 20 mg AT NIGHT + Singulair (montelukast) 10 mg AT NIGHT   3. Return in about 3 months (around 11/09/2020).    Please inform of any Emergency Department visits, hospitalizations, or changes in symptoms. Call 01/09/2021 before going to the ED for breathing or allergy symptoms since we might be able to fit you in for a sick visit. Feel free to contact us anytime with any questions, problems, or concerns.  It was a pleasure to talk to you today today!  Websites that have reliable patient information: 1. American Academy of Asthma, Allergy, and Immunology: www.aaaai.org 2. Food Allergy Research and Education (FARE): foodallergy.org 3. Mothers of Asthmatics: http://www.asthmacommunitynetwork.org 4. American College of Allergy, Asthma, and Immunology: www.acaai.org   COVID-19 Vaccine Information can be found at: [place] For  questions related to vaccine distribution or appointments, please email [email or web address] or call [phone].   We realize that you might be concerned about having an allergic reaction to the COVID19 vaccines. To help with that concern, WE ARE OFFERING THE COVID19 VACCINES IN OUR OFFICE! Ask the front desk for dates!     "Like" [email or web address] on Facebook and Instagram for our latest updates!      A healthy democracy works best when [phone] participate! Make sure you are registered to vote! If you have moved or changed any of your contact information, you will need to get this updated before voting!  In some cases, you MAY be able to register to vote online: [place]

## 2020-08-10 ENCOUNTER — Encounter: Payer: Self-pay | Admitting: Allergy & Immunology

## 2020-08-12 NOTE — Progress Notes (Signed)
Left voicemail to call back to schedule 3 month follow up

## 2020-08-23 ENCOUNTER — Ambulatory Visit (INDEPENDENT_AMBULATORY_CARE_PROVIDER_SITE_OTHER): Payer: 59

## 2020-08-23 DIAGNOSIS — J309 Allergic rhinitis, unspecified: Secondary | ICD-10-CM | POA: Diagnosis not present

## 2020-09-05 DIAGNOSIS — T8484XA Pain due to internal orthopedic prosthetic devices, implants and grafts, initial encounter: Secondary | ICD-10-CM | POA: Insufficient documentation

## 2020-09-06 ENCOUNTER — Ambulatory Visit (INDEPENDENT_AMBULATORY_CARE_PROVIDER_SITE_OTHER): Payer: 59

## 2020-09-06 DIAGNOSIS — J309 Allergic rhinitis, unspecified: Secondary | ICD-10-CM

## 2020-09-11 ENCOUNTER — Telehealth: Payer: Self-pay

## 2020-09-11 NOTE — Telephone Encounter (Signed)
Patient called stating she was supposed to receive a referral to an ENT @ her last visit on 08/09/2020.  I didn't get a message regarding this referral, but I did call Dr Avel Sensor office and get the patient scheduled for 10/10/2020 @ 2:30 PM with Dr Suszanne Conners.  Patient has been informed of this information & I apologized for the miscommunication on our part.  Thanks

## 2020-09-11 NOTE — Telephone Encounter (Signed)
Thank you :)

## 2020-09-12 DIAGNOSIS — J3081 Allergic rhinitis due to animal (cat) (dog) hair and dander: Secondary | ICD-10-CM | POA: Diagnosis not present

## 2020-09-12 NOTE — Progress Notes (Signed)
VIALS EXP 09-12-21 

## 2020-09-13 ENCOUNTER — Ambulatory Visit (INDEPENDENT_AMBULATORY_CARE_PROVIDER_SITE_OTHER): Payer: 59

## 2020-09-13 DIAGNOSIS — J309 Allergic rhinitis, unspecified: Secondary | ICD-10-CM | POA: Diagnosis not present

## 2020-09-27 ENCOUNTER — Ambulatory Visit (INDEPENDENT_AMBULATORY_CARE_PROVIDER_SITE_OTHER): Payer: 59 | Admitting: *Deleted

## 2020-09-27 DIAGNOSIS — J309 Allergic rhinitis, unspecified: Secondary | ICD-10-CM | POA: Diagnosis not present

## 2020-10-04 ENCOUNTER — Ambulatory Visit (INDEPENDENT_AMBULATORY_CARE_PROVIDER_SITE_OTHER): Payer: 59

## 2020-10-04 DIAGNOSIS — J309 Allergic rhinitis, unspecified: Secondary | ICD-10-CM

## 2020-10-18 ENCOUNTER — Ambulatory Visit (INDEPENDENT_AMBULATORY_CARE_PROVIDER_SITE_OTHER): Payer: 59

## 2020-10-18 DIAGNOSIS — J309 Allergic rhinitis, unspecified: Secondary | ICD-10-CM

## 2020-10-25 ENCOUNTER — Ambulatory Visit (INDEPENDENT_AMBULATORY_CARE_PROVIDER_SITE_OTHER): Payer: 59

## 2020-10-25 DIAGNOSIS — J309 Allergic rhinitis, unspecified: Secondary | ICD-10-CM

## 2020-11-01 ENCOUNTER — Ambulatory Visit (INDEPENDENT_AMBULATORY_CARE_PROVIDER_SITE_OTHER): Payer: 59

## 2020-11-01 DIAGNOSIS — J309 Allergic rhinitis, unspecified: Secondary | ICD-10-CM | POA: Diagnosis not present

## 2020-11-13 ENCOUNTER — Ambulatory Visit (HOSPITAL_COMMUNITY): Payer: 59 | Attending: Orthopedic Surgery | Admitting: Physical Therapy

## 2020-11-13 ENCOUNTER — Other Ambulatory Visit: Payer: Self-pay

## 2020-11-13 ENCOUNTER — Ambulatory Visit (INDEPENDENT_AMBULATORY_CARE_PROVIDER_SITE_OTHER): Payer: 59

## 2020-11-13 DIAGNOSIS — R262 Difficulty in walking, not elsewhere classified: Secondary | ICD-10-CM | POA: Insufficient documentation

## 2020-11-13 DIAGNOSIS — J309 Allergic rhinitis, unspecified: Secondary | ICD-10-CM

## 2020-11-13 DIAGNOSIS — M79671 Pain in right foot: Secondary | ICD-10-CM | POA: Diagnosis present

## 2020-11-13 NOTE — Therapy (Signed)
[place] [facility] [address] [place], [place], [address] Phone: [phone]   Fax:  [phone]  Physical Therapy Evaluation  Patient Details  Name: Leah Phillips MRN: [id number] Date of Birth: 11/29/1970 Referring Provider (PT): Daws, Jamelle Haring   Encounter Date: 11/13/2020   PT End of Session - 11/13/20 1613    Visit Number 1    Number of Visits 12    Date for PT Re-Evaluation 01/08/21   PT will not return for two weeks to work on her desensitiztion program on own during this period   Authorization Type [employer]    Authorization - Visit Number 1    Authorization - Number of Visits 30    PT Start Time 1405    PT Stop Time 1445    PT Time Calculation (min) 40 min    Activity Tolerance Patient tolerated treatment well    Behavior During Therapy [facility] for tasks assessed/performed           Past Medical History:  Diagnosis Date  . Arthritis   . Carpal tunnel syndrome   . Essential hypertension   . GERD (gastroesophageal reflux disease)   . History of UTI   . Low back pain   . Obesity   . Urticaria     Past Surgical History:  Procedure Laterality Date  . ANTERIOR CRUCIATE LIGAMENT REPAIR Left   . CARPAL TUNNEL RELEASE Right 04/01/2015   Procedure: Right Carpal Tunnel Release;  Surgeon: Eldred Manges, MD;  Location: [facility];  Service: Orthopedics;  Laterality: Right;  . CHOLECYSTECTOMY    . EXCISION METACARPAL MASS Right 04/01/2015   Procedure: Excision Right Long Finger Mass;  Surgeon: Eldred Manges, MD;  Location:  SURGERY CENTER;  Service: Orthopedics;  Laterality: Right;    There were no vitals filed for this visit.    Subjective Assessment - 11/13/20 1407    Subjective [name] states that she has had three foot surgeries.  She started out with a bunionectomy, followed by a bone spur surgery with ORIF and a third surgery to shave the bone and take the screw out.  The third surgery was on 3.9.2022.   At this point she is having pain where the other screw is at.  She can not stand a bed sheet on her, she can not wear a normal shoe.  [name] always wearing a bandaid and sleeve on her foot.  She has been seen at a clinic in [place] for an evaluation but she was transferred here due to it being closer.  She has just been given Lyrica for her pain.    Pertinent History multiple surgery on her Rt foot. LT buniionectomy, Rt elbow surgery, Lt ACL, carpal tunnel    Limitations Sitting    How long can you sit comfortably? no problem    How long can you stand comfortably? no problem    How long can you walk comfortably? after walking two hours she starts to have swelling and increased pain.    Patient Stated Goals To get back into her heels.    Currently in Pain? Yes    Pain Score 4     Pain Location Foot    Pain Orientation Right;Medial    Pain Descriptors / Indicators Sharp;Aching    Pain Type Chronic pain    Pain Onset More than a month ago    Pain Frequency Constant    Aggravating Factors  walking  Pain Relieving Factors nothing has tried contrast baths without relief.    Effect of Pain on Daily Activities limits wears bandaids              [facility] - 11/13/20 0001      Assessment   Medical Diagnosis hypersensitivity    Referring Provider (PT) Daws, Jamelle HaringSnow    Next MD Visit not scheduled    Prior Therapy none for this      Precautions   Precautions None      Restrictions   Weight Bearing Restrictions No      Balance Screen   Has the patient fallen in the past 6 months No    Has the patient had a decrease in activity level because of a fear of falling?  No    Is the patient reluctant to leave their home because of a fear of falling?  No      Prior Function   Level of Independence Independent    Vocation Full time employment    Vocation Requirements CNA      Cognition   Overall Cognitive Status Within Functional Limits for tasks assessed      ROM /  Strength   AROM / PROM / Strength AROM;Strength      AROM   AROM Assessment Site Ankle    Right/Left Ankle Right   [email or web address]     Strength   Strength Assessment Site Ankle    Right/Left Ankle Right    Right Ankle Dorsiflexion 3+/5   pain   Right Ankle Plantar Flexion 4/5   secondary to pain   Right Ankle Inversion 3+/5   pain   Right Ankle Eversion 4+/5      Palpation   Palpation comment tender to palpation with noted scaring at incision.   noted protrusion palpatable                  PT educated on desensitization program;  Start with cottonball, go to terry cloth progress to burlap, then light tapping, moderate  Tapping progress to deep pressure.    Objective measurements completed on examination: See above findings.               PT Education - 11/13/20 1612    Education Details Desensitization program.    Person(s) Educated Patient    Methods Explanation    Comprehension Verbalized understanding            PT Short Term Goals - 11/13/20 1628      PT SHORT TERM GOAL #1   Title Patient will be independent with HEP in order to optimize outcomes    Time 3    Period Weeks    Status New    Target Date 12/18/20   waiting 2 weeks for tx to start     PT SHORT TERM GOAL #2   Title Patient will report that pain will be no greater than 3/10 indicating improved tolerance to physical activity.    Time 3    Period Weeks    Status New             PT Long Term Goals - 11/13/20 1629      PT LONG TERM GOAL #1   Title Patient will deny pain at the end of 2 MWT to indicate improved tolerance to daily mobility.    Time 6    Period Weeks    Status New    Target Date 01/08/21  PT LONG TERM GOAL #2   Title Patient will be able to resume all ADL unrestriced for full return to daily acitities.    Time 6    Period Weeks    Status New      PT LONG TERM GOAL #3   Title Patient will perform R ankle  MMT 5/5 in all planes without symptoms to demonstrate  improved strength for daily activity to be able to walk for 10 minutes without increased pain.    Time 6    Period Weeks    Status New                  Plan - 11/13/20 1615    Clinical Impression Statement [name] has had three surgeries on her right foot.  Following the last surgery she has been experiencing constant pain, the sensitivity is so severe that she can not even tolerate a sheet on her foot.  She has been referred to this clinic for CRPS,(complex, regional pain syndrome.)  The pt  is very sensitive to the touch, she has decreased strength but this appears to be more due to pain than weakness, her ROM is functional.  The pt is unable to ambulate greater than two minutes without her pain increasing.  Ms. Gorniak will benefit from skilled PT to addres the following and improve her functional mobility.    Personal Factors and Comorbidities Comorbidity 3+;Fitness;Time since onset of injury/illness/exacerbation    Comorbidities 3 surgeries on foot first bunionectomy, second to place nail and 2 screws , third to remove a screw,    Examination-Activity Limitations Dressing;Locomotion Level    Examination-Participation Restrictions Cleaning;Community Activity;Occupation;Yard Work;Shop    Stability/Clinical Decision Making Evolving/Moderate complexity    Clinical Decision Making Moderate    Rehab Potential Good    PT Frequency 2x / week   to start in two weeks   PT Duration 6 weeks    PT Treatment/Interventions Other (comment);Gait training;Stair training;Functional mobility training;Therapeutic activities;Therapeutic exercise;Patient/family education;Manual techniques;Scar mobilization   laser   PT Next Visit Plan begin scar and edema massage with instruction for self care, begin towel crunch , heel toe raises, isometrics and laser    PT Home Exercise Plan desensitization program    Consulted and Agree with Plan of Care Patient           Patient will benefit from skilled  therapeutic intervention in order to improve the following deficits and impairments:  Decreased activity tolerance,Decreased skin integrity,Decreased scar mobility,Decreased strength,Difficulty walking,Impaired perceived functional ability,Pain,Increased edema  Visit Diagnosis: Pain in right foot  Difficulty in walking, not elsewhere classified     Problem List Patient Active Problem List   Diagnosis Date Noted  . Seasonal and perennial allergic rhinitis 02/15/2019  . Allergic urticaria 02/15/2019  . Partial tear of left Achilles tendon 08/25/2018  . Bunion of great toe of right foot 09/02/2017  . Ganglion cyst of finger of right hand 02/04/2017  . Acute pain of left knee 01/14/2017  . Right elbow pain 01/14/2017  . Pes anserinus bursitis 02/15/2014  . PRURITUS 10/15/2009  . FATIGUE 07/23/2009  . SHOULDER PAIN, RIGHT 06/21/2009  . NECK PAIN 05/06/2009  . BACK PAIN 02/21/2009  . HYPERTENSION 10/02/2008  . KNEE PAIN, RIGHT 09/27/2008  . FOLLICULITIS 07/29/2008  . UNSPECIFIED VAGINITIS AND VULVOVAGINITIS 01/04/2008  . 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 OBESITY, UNSPECIFIED 11/11/2007  . ALLERGIC RHINITIS, SEASONAL 11/11/2007  . A C L SPRAIN-CHRONIC 06/23/2007  . TEAR A C  L 06/23/2007  Virgina Organ, PT CLT [phone] 11/13/2020, 4:41 PM  [place] [facility] [address] [place], [place], [address] Phone: [phone]   Fax:  [phone]  Name: ANNAMARY BUSCHMAN MRN: [id number] Date of Birth: 10-18-1970

## 2020-11-27 ENCOUNTER — Ambulatory Visit (INDEPENDENT_AMBULATORY_CARE_PROVIDER_SITE_OTHER): Payer: 59

## 2020-11-27 DIAGNOSIS — J309 Allergic rhinitis, unspecified: Secondary | ICD-10-CM | POA: Diagnosis not present

## 2020-12-03 ENCOUNTER — Ambulatory Visit (HOSPITAL_COMMUNITY): Payer: 59

## 2020-12-05 ENCOUNTER — Encounter (HOSPITAL_COMMUNITY): Payer: 59

## 2020-12-11 ENCOUNTER — Encounter (HOSPITAL_COMMUNITY): Payer: 59

## 2020-12-13 ENCOUNTER — Ambulatory Visit (HOSPITAL_COMMUNITY): Payer: 59 | Admitting: Physical Therapy

## 2020-12-13 ENCOUNTER — Encounter (HOSPITAL_COMMUNITY): Payer: Self-pay | Admitting: Physical Therapy

## 2020-12-13 ENCOUNTER — Telehealth (HOSPITAL_COMMUNITY): Payer: Self-pay | Admitting: Physical Therapy

## 2020-12-13 ENCOUNTER — Ambulatory Visit (INDEPENDENT_AMBULATORY_CARE_PROVIDER_SITE_OTHER): Payer: 59

## 2020-12-13 DIAGNOSIS — J309 Allergic rhinitis, unspecified: Secondary | ICD-10-CM | POA: Diagnosis not present

## 2020-12-13 NOTE — Therapy (Signed)
[place], [place], [address] Phone: [phone]   Fax:  [phone]  Patient Details  Name: Leah Phillips MRN: [id number] Date of Birth: Aug 26, 1970 Referring Provider:  No ref. provider found  Encounter Date: 12/13/2020  PHYSICAL THERAPY DISCHARGE SUMMARY  Visits from Start of Care: 1  Current functional level related to goals / functional outcomes: Unknown pt has not returned due to having to have a second surgery.    Remaining deficits: unknown   Education / Equipment: HEP on desensitisation   Patient agrees to discharge. Patient goals were not met. Patient is being discharged due to a change in medical status.   Rayetta Humphrey, PT CLT [phone]  12/13/2020, 11:17 AM  [place], [place], [address] Phone: [phone]   Fax:  [phone]

## 2020-12-13 NOTE — Telephone Encounter (Signed)
Pt called stating she will have surgery again on her toe and wanted to d/c for PT at this time. She will come back after surgery if needed.

## 2020-12-17 ENCOUNTER — Ambulatory Visit (HOSPITAL_COMMUNITY): Payer: 59 | Admitting: Physical Therapy

## 2020-12-19 ENCOUNTER — Encounter (HOSPITAL_COMMUNITY): Payer: 59 | Admitting: Physical Therapy

## 2020-12-20 ENCOUNTER — Ambulatory Visit (INDEPENDENT_AMBULATORY_CARE_PROVIDER_SITE_OTHER): Payer: 59

## 2020-12-20 DIAGNOSIS — J309 Allergic rhinitis, unspecified: Secondary | ICD-10-CM | POA: Diagnosis not present

## 2020-12-24 ENCOUNTER — Encounter (HOSPITAL_COMMUNITY): Payer: 59

## 2020-12-26 ENCOUNTER — Encounter (HOSPITAL_COMMUNITY): Payer: 59 | Admitting: Physical Therapy

## 2021-01-10 ENCOUNTER — Ambulatory Visit (INDEPENDENT_AMBULATORY_CARE_PROVIDER_SITE_OTHER): Payer: 59

## 2021-01-10 DIAGNOSIS — J309 Allergic rhinitis, unspecified: Secondary | ICD-10-CM | POA: Diagnosis not present

## 2021-01-24 ENCOUNTER — Ambulatory Visit (INDEPENDENT_AMBULATORY_CARE_PROVIDER_SITE_OTHER): Payer: 59

## 2021-01-24 DIAGNOSIS — J309 Allergic rhinitis, unspecified: Secondary | ICD-10-CM

## 2021-01-31 ENCOUNTER — Ambulatory Visit (INDEPENDENT_AMBULATORY_CARE_PROVIDER_SITE_OTHER): Payer: 59

## 2021-01-31 DIAGNOSIS — J309 Allergic rhinitis, unspecified: Secondary | ICD-10-CM | POA: Diagnosis not present

## 2021-02-05 ENCOUNTER — Ambulatory Visit (INDEPENDENT_AMBULATORY_CARE_PROVIDER_SITE_OTHER): Payer: 59

## 2021-02-05 DIAGNOSIS — J309 Allergic rhinitis, unspecified: Secondary | ICD-10-CM

## 2021-02-06 DIAGNOSIS — J3089 Other allergic rhinitis: Secondary | ICD-10-CM

## 2021-02-06 NOTE — Progress Notes (Signed)
VIALS MADE. EXP 02-06-22 

## 2021-02-14 ENCOUNTER — Ambulatory Visit (INDEPENDENT_AMBULATORY_CARE_PROVIDER_SITE_OTHER): Payer: 59

## 2021-02-14 DIAGNOSIS — J309 Allergic rhinitis, unspecified: Secondary | ICD-10-CM

## 2021-02-19 ENCOUNTER — Ambulatory Visit (INDEPENDENT_AMBULATORY_CARE_PROVIDER_SITE_OTHER): Payer: 59 | Admitting: *Deleted

## 2021-02-19 DIAGNOSIS — J309 Allergic rhinitis, unspecified: Secondary | ICD-10-CM

## 2021-02-21 ENCOUNTER — Other Ambulatory Visit: Payer: Self-pay

## 2021-02-21 ENCOUNTER — Ambulatory Visit (HOSPITAL_COMMUNITY)
Admission: RE | Admit: 2021-02-21 | Discharge: 2021-02-21 | Disposition: A | Discharge status: Home / Self Care | Payer: 59 | Source: Ambulatory Visit | Attending: Family Medicine | Admitting: Family Medicine

## 2021-02-21 ENCOUNTER — Other Ambulatory Visit (HOSPITAL_COMMUNITY): Payer: Self-pay | Admitting: Family Medicine

## 2021-02-21 DIAGNOSIS — M79672 Pain in left foot: Secondary | ICD-10-CM

## 2021-02-21 MED ORDER — NORETHIN ACE-ETH ESTRAD-FE 1.5-30 MG-MCG PO TABS: 1.0000 | ORAL_TABLET | Freq: Every day | ORAL | 3 refills | Status: DC

## 2021-02-26 ENCOUNTER — Ambulatory Visit (INDEPENDENT_AMBULATORY_CARE_PROVIDER_SITE_OTHER): Payer: 59

## 2021-02-26 DIAGNOSIS — J309 Allergic rhinitis, unspecified: Secondary | ICD-10-CM | POA: Diagnosis not present

## 2021-03-05 ENCOUNTER — Ambulatory Visit (INDEPENDENT_AMBULATORY_CARE_PROVIDER_SITE_OTHER): Payer: 59

## 2021-03-05 DIAGNOSIS — J309 Allergic rhinitis, unspecified: Secondary | ICD-10-CM

## 2021-03-14 ENCOUNTER — Ambulatory Visit (INDEPENDENT_AMBULATORY_CARE_PROVIDER_SITE_OTHER): Payer: 59

## 2021-03-14 DIAGNOSIS — J309 Allergic rhinitis, unspecified: Secondary | ICD-10-CM | POA: Diagnosis not present

## 2021-03-19 ENCOUNTER — Ambulatory Visit (INDEPENDENT_AMBULATORY_CARE_PROVIDER_SITE_OTHER): Payer: 59

## 2021-03-19 DIAGNOSIS — J309 Allergic rhinitis, unspecified: Secondary | ICD-10-CM | POA: Diagnosis not present

## 2021-03-28 ENCOUNTER — Ambulatory Visit (INDEPENDENT_AMBULATORY_CARE_PROVIDER_SITE_OTHER): Payer: 59

## 2021-03-28 DIAGNOSIS — J309 Allergic rhinitis, unspecified: Secondary | ICD-10-CM

## 2021-04-02 ENCOUNTER — Ambulatory Visit (INDEPENDENT_AMBULATORY_CARE_PROVIDER_SITE_OTHER): Payer: 59

## 2021-04-02 DIAGNOSIS — J309 Allergic rhinitis, unspecified: Secondary | ICD-10-CM

## 2021-04-16 ENCOUNTER — Ambulatory Visit: Payer: 59 | Admitting: Obstetrics & Gynecology

## 2021-04-16 ENCOUNTER — Ambulatory Visit (INDEPENDENT_AMBULATORY_CARE_PROVIDER_SITE_OTHER): Payer: 59

## 2021-04-16 DIAGNOSIS — J309 Allergic rhinitis, unspecified: Secondary | ICD-10-CM | POA: Diagnosis not present

## 2021-04-25 ENCOUNTER — Other Ambulatory Visit: Payer: Self-pay | Admitting: Allergy & Immunology

## 2021-04-30 ENCOUNTER — Ambulatory Visit (INDEPENDENT_AMBULATORY_CARE_PROVIDER_SITE_OTHER): Payer: 59 | Admitting: *Deleted

## 2021-04-30 DIAGNOSIS — J309 Allergic rhinitis, unspecified: Secondary | ICD-10-CM | POA: Diagnosis not present

## 2021-05-07 ENCOUNTER — Other Ambulatory Visit: Payer: Self-pay | Admitting: Allergy & Immunology

## 2021-05-07 ENCOUNTER — Encounter: Payer: Self-pay | Admitting: Allergy & Immunology

## 2021-05-14 ENCOUNTER — Ambulatory Visit (INDEPENDENT_AMBULATORY_CARE_PROVIDER_SITE_OTHER): Payer: 59

## 2021-05-14 DIAGNOSIS — J309 Allergic rhinitis, unspecified: Secondary | ICD-10-CM | POA: Diagnosis not present

## 2021-05-15 DIAGNOSIS — J3089 Other allergic rhinitis: Secondary | ICD-10-CM

## 2021-05-15 NOTE — Progress Notes (Signed)
VIALS MADE. EXP 05-15-22 

## 2021-05-28 ENCOUNTER — Ambulatory Visit: Payer: 59 | Admitting: Obstetrics & Gynecology

## 2021-05-28 ENCOUNTER — Ambulatory Visit (INDEPENDENT_AMBULATORY_CARE_PROVIDER_SITE_OTHER): Payer: 59

## 2021-05-28 DIAGNOSIS — J309 Allergic rhinitis, unspecified: Secondary | ICD-10-CM | POA: Diagnosis not present

## 2021-05-29 ENCOUNTER — Other Ambulatory Visit (HOSPITAL_COMMUNITY): Payer: Self-pay | Admitting: Internal Medicine

## 2021-05-29 DIAGNOSIS — Z1231 Encounter for screening mammogram for malignant neoplasm of breast: Secondary | ICD-10-CM

## 2021-06-04 ENCOUNTER — Ambulatory Visit: Payer: 59 | Admitting: Obstetrics & Gynecology

## 2021-06-18 ENCOUNTER — Ambulatory Visit (HOSPITAL_COMMUNITY): Payer: 59

## 2021-06-26 ENCOUNTER — Other Ambulatory Visit: Payer: Self-pay | Admitting: Family Medicine

## 2021-06-26 ENCOUNTER — Other Ambulatory Visit: Payer: Self-pay | Admitting: Internal Medicine

## 2021-06-26 DIAGNOSIS — Z1231 Encounter for screening mammogram for malignant neoplasm of breast: Secondary | ICD-10-CM

## 2021-08-06 ENCOUNTER — Other Ambulatory Visit: Payer: Self-pay | Admitting: Allergy & Immunology

## 2021-08-13 ENCOUNTER — Ambulatory Visit
Admission: RE | Admit: 2021-08-13 | Discharge: 2021-08-13 | Disposition: A | Discharge status: Home / Self Care | Payer: No Typology Code available for payment source | Source: Ambulatory Visit | Attending: Internal Medicine | Admitting: Internal Medicine

## 2021-08-13 ENCOUNTER — Other Ambulatory Visit: Payer: Self-pay

## 2021-08-13 DIAGNOSIS — Z1231 Encounter for screening mammogram for malignant neoplasm of breast: Secondary | ICD-10-CM

## 2021-08-26 ENCOUNTER — Other Ambulatory Visit: Payer: Self-pay | Admitting: Allergy & Immunology

## 2021-09-18 ENCOUNTER — Other Ambulatory Visit (HOSPITAL_COMMUNITY): Payer: Self-pay | Admitting: Internal Medicine

## 2021-09-18 ENCOUNTER — Other Ambulatory Visit: Payer: Self-pay | Admitting: Internal Medicine

## 2021-09-18 DIAGNOSIS — M545 Low back pain, unspecified: Secondary | ICD-10-CM

## 2021-09-22 ENCOUNTER — Ambulatory Visit (HOSPITAL_COMMUNITY)
Admission: RE | Admit: 2021-09-22 | Discharge: 2021-09-22 | Disposition: A | Discharge status: Home / Self Care | Payer: 59 | Source: Ambulatory Visit | Attending: Internal Medicine | Admitting: Internal Medicine

## 2021-09-22 DIAGNOSIS — M5136 Other intervertebral disc degeneration, lumbar region: Secondary | ICD-10-CM | POA: Diagnosis not present

## 2021-09-22 DIAGNOSIS — M545 Low back pain, unspecified: Secondary | ICD-10-CM | POA: Insufficient documentation

## 2021-09-22 DIAGNOSIS — M47894 Other spondylosis, thoracic region: Secondary | ICD-10-CM | POA: Diagnosis not present

## 2021-09-22 DIAGNOSIS — M47896 Other spondylosis, lumbar region: Secondary | ICD-10-CM | POA: Insufficient documentation

## 2022-01-08 ENCOUNTER — Ambulatory Visit (HOSPITAL_COMMUNITY)
Admission: RE | Admit: 2022-01-08 | Discharge: 2022-01-08 | Disposition: A | Discharge status: Home / Self Care | Payer: 59 | Source: Ambulatory Visit | Attending: Family Medicine | Admitting: Family Medicine

## 2022-01-08 ENCOUNTER — Other Ambulatory Visit (HOSPITAL_COMMUNITY): Payer: Self-pay | Admitting: Family Medicine

## 2022-01-08 DIAGNOSIS — M79671 Pain in right foot: Secondary | ICD-10-CM | POA: Insufficient documentation

## 2022-01-08 DIAGNOSIS — R609 Edema, unspecified: Secondary | ICD-10-CM | POA: Diagnosis present

## 2022-03-13 ENCOUNTER — Ambulatory Visit (HOSPITAL_COMMUNITY)
Admission: RE | Admit: 2022-03-13 | Discharge: 2022-03-13 | Disposition: A | Discharge status: Home / Self Care | Payer: 59 | Source: Ambulatory Visit | Attending: Internal Medicine | Admitting: Internal Medicine

## 2022-03-13 ENCOUNTER — Other Ambulatory Visit (HOSPITAL_COMMUNITY): Payer: Self-pay | Admitting: Internal Medicine

## 2022-03-13 DIAGNOSIS — M25531 Pain in right wrist: Secondary | ICD-10-CM | POA: Diagnosis not present

## 2022-03-23 ENCOUNTER — Ambulatory Visit: Payer: Self-pay | Admitting: Internal Medicine

## 2022-03-27 DIAGNOSIS — M25531 Pain in right wrist: Secondary | ICD-10-CM | POA: Diagnosis not present

## 2022-04-11 DIAGNOSIS — M5416 Radiculopathy, lumbar region: Secondary | ICD-10-CM | POA: Insufficient documentation

## 2022-04-24 DIAGNOSIS — M25531 Pain in right wrist: Secondary | ICD-10-CM | POA: Diagnosis not present

## 2022-05-20 DIAGNOSIS — M25531 Pain in right wrist: Secondary | ICD-10-CM | POA: Diagnosis not present

## 2022-05-22 DIAGNOSIS — M25531 Pain in right wrist: Secondary | ICD-10-CM | POA: Diagnosis not present

## 2022-06-04 DIAGNOSIS — M25531 Pain in right wrist: Secondary | ICD-10-CM | POA: Diagnosis not present

## 2022-06-17 ENCOUNTER — Ambulatory Visit: Payer: Self-pay | Admitting: Allergy & Immunology

## 2022-09-15 DIAGNOSIS — E881 Lipodystrophy, not elsewhere classified: Secondary | ICD-10-CM | POA: Insufficient documentation

## 2022-09-23 DIAGNOSIS — Z9889 Other specified postprocedural states: Secondary | ICD-10-CM | POA: Insufficient documentation

## 2023-10-25 ENCOUNTER — Ambulatory Visit: Admitting: Orthopedic Surgery

## 2023-10-27 DIAGNOSIS — T7840XA Allergy, unspecified, initial encounter: Secondary | ICD-10-CM | POA: Insufficient documentation

## 2023-10-28 ENCOUNTER — Ambulatory Visit: Admitting: Orthopedic Surgery

## 2023-10-28 ENCOUNTER — Encounter: Payer: Self-pay | Admitting: Orthopedic Surgery

## 2023-10-28 ENCOUNTER — Other Ambulatory Visit (INDEPENDENT_AMBULATORY_CARE_PROVIDER_SITE_OTHER)

## 2023-10-28 VITALS — BP 131/92 | HR 80 | Ht 64.0 in | Wt 200.0 lb

## 2023-10-28 DIAGNOSIS — M1611 Unilateral primary osteoarthritis, right hip: Secondary | ICD-10-CM

## 2023-10-28 DIAGNOSIS — M25551 Pain in right hip: Secondary | ICD-10-CM

## 2023-10-28 DIAGNOSIS — M1711 Unilateral primary osteoarthritis, right knee: Secondary | ICD-10-CM

## 2023-10-28 MED ORDER — MELOXICAM 7.5 MG PO TABS: 7.5000 mg | ORAL_TABLET | Freq: Every day | ORAL | 5 refills | Status: DC

## 2023-10-28 NOTE — Progress Notes (Signed)
  Intake history:  BP (!) 131/92   Pulse 80   Ht 5\' 4"  (1.626 m)   Wt 200 lb (90.7 kg)   BMI 34.33 kg/m  Body mass index is 34.33 kg/m.    WHAT ARE WE SEEING YOU FOR TODAY?   right knee(s)  How long has this bothered you? (DOI?DOS?WS?)  2 month(s) ago pain/ tenderness   Anticoag.  No  Diabetes No  Heart disease No  Hypertension Yes  SMOKING HX No  Kidney disease No  Any ALLERGIES ________ Allergies  Allergen Reactions   Orange Fruit [Citrus] Anaphylaxis   Amoxicillin    Pecan Nut (Diagnostic) Other (See Comments)    Nuts- tested   Penicillins Hives   ______________________________________   Treatment:  Have you taken:  Tylenol  No  Advil  Yes  Had PT No  Had injection No  Other  _______________Hydrocodone xray __at [facility] ________

## 2023-10-28 NOTE — Progress Notes (Signed)
 Patient: Leah Phillips           Date of Birth: December 10, 1970           MRN: [id number] Visit Date: 10/28/2023 Requested by: Theoplis Fix, MD [address] [place],  [place] [address] PCP: Theoplis Fix, MD  Assessment and plan  53 year old female right knee pain, also has right hip pain  X-rays show arthritis of the hip and knee.  Recommend she start anti-inflammatories, continue with weight loss efforts and exercise  Return in 30 days if no improvement after being on meloxicam  then we will discuss injecting the left hip and left knee   Chief Complaint  Patient presents with   Knee Pain    Right    Encounter Diagnoses  Name Primary?   Pain in right hip    Arthritis of right hip    Arthritis of knee, right Yes    Plan:  Recheck in 30 days after being on meloxicam   Meds ordered this encounter  Medications   meloxicam  (MOBIC ) 7.5 MG tablet    Sig: Take 1 tablet (7.5 mg total) by mouth daily.    Dispense:  30 tablet    Refill:  5     Chief Complaint  Patient presents with   Knee Pain    Right     53 year old female presents for right knee pain evaluation  Symptoms came on gradually no history of trauma.  Right medial knee pain radiates up into the right thigh and hip  She has trouble placing her left knee onto her right when she is sleeping on her side and she has trouble placing the leg in the figure-of-four position with increased right hip pain in the groin    Knee Pain     Body mass index is 34.33 kg/m.   Problem list, medical hx, medications and allergies reviewed   Review of Systems  Musculoskeletal:  Positive for back pain and joint pain.     Allergies  Allergen Reactions   Orange Fruit [Citrus] Anaphylaxis   Amoxicillin    Pecan Nut (Diagnostic) Other (See Comments)    Nuts- tested   Penicillins Hives    BP (!) 131/92   Pulse 80   Ht 5\' 4"  (1.626 m)   Wt 200 lb (90.7 kg)   BMI 34.33 kg/m    Physical exam: Physical Exam Vitals and  nursing note reviewed.  Constitutional:      Appearance: Normal appearance.  HENT:     Head: Normocephalic and atraumatic.  Eyes:     General: No scleral icterus.       Right eye: No discharge.        Left eye: No discharge.     Extraocular Movements: Extraocular movements intact.     Conjunctiva/sclera: Conjunctivae normal.     Pupils: Pupils are equal, round, and reactive to light.  Cardiovascular:     Rate and Rhythm: Normal rate.     Pulses: Normal pulses.  Musculoskeletal:     Right knee: No effusion.     Instability Tests: Medial McMurray test negative.  Skin:    General: Skin is warm and dry.     Capillary Refill: Capillary refill takes less than 2 seconds.  Neurological:     General: No focal deficit present.     Mental Status: [name] alert and oriented to person, place, and time.  Psychiatric:        Mood and Affect: Mood normal.  Behavior: Behavior normal.        Thought Content: Thought content normal.        Judgment: Judgment normal.     Right Knee Exam   Tenderness  The patient is experiencing tenderness in the medial joint line.  Range of Motion  The patient has normal right knee ROM.  Tests  McMurray:  Medial - negative  Varus: negative Valgus: negative Drawer:  Anterior - negative    Posterior - negative  Other  Erythema: absent Scars: absent Sensation: normal Pulse: present Swelling: none Effusion: no effusion present   Left Knee Exam   Muscle Strength  The patient has normal left knee strength.  Tenderness  The patient is experiencing no tenderness.   Range of Motion  The patient has normal left knee ROM.   Right Hip Exam   Tenderness  The patient is experiencing no tenderness.   Range of Motion  Flexion:  normal  Internal rotation:  normal Right hip internal rotation: Pain with internal rotation of the right hip only.   Left Hip Exam   Tenderness  The patient is experiencing no tenderness.   Range of Motion   Flexion:  normal  Internal rotation: normal       Data reviewed:   Image(s) reviewed with personal interpretation:  X-ray right hip shows narrowing of the weightbearing area of the right hip with mild osteophyte surrounding the joint consistent with moderate arthritis no signs of AVN  X-ray right knee  Submit for joint space narrowing probably grade 1 arthritis by K and G criteria  Assessment and plan:  Encounter Diagnoses  Name Primary?   Pain in right hip    Arthritis of right hip    Arthritis of knee, right Yes    We will start with nonoperative measures with, weight loss exercise and anti-inflammatory medication.  Recheck patient in 30 days to see if more invasive things like injection need to be considered   Meds ordered this encounter  Medications   meloxicam  (MOBIC ) 7.5 MG tablet    Sig: Take 1 tablet (7.5 mg total) by mouth daily.    Dispense:  30 tablet    Refill:  5    Procedures:   NONE

## 2023-12-02 ENCOUNTER — Ambulatory Visit: Admitting: Orthopedic Surgery

## 2023-12-13 ENCOUNTER — Ambulatory Visit: Admitting: Orthopedic Surgery

## 2024-01-20 DIAGNOSIS — Z299 Encounter for prophylactic measures, unspecified: Secondary | ICD-10-CM | POA: Diagnosis not present

## 2024-01-20 DIAGNOSIS — I10 Essential (primary) hypertension: Secondary | ICD-10-CM | POA: Diagnosis not present

## 2024-01-20 DIAGNOSIS — J9611 Chronic respiratory failure with hypoxia: Secondary | ICD-10-CM | POA: Diagnosis not present

## 2024-02-17 ENCOUNTER — Ambulatory Visit: Admitting: Orthopedic Surgery

## 2024-02-22 DIAGNOSIS — Z1389 Encounter for screening for other disorder: Secondary | ICD-10-CM | POA: Diagnosis not present

## 2024-02-22 DIAGNOSIS — R52 Pain, unspecified: Secondary | ICD-10-CM | POA: Diagnosis not present

## 2024-02-22 DIAGNOSIS — Z0000 Encounter for general adult medical examination without abnormal findings: Secondary | ICD-10-CM | POA: Diagnosis not present

## 2024-02-22 DIAGNOSIS — J9611 Chronic respiratory failure with hypoxia: Secondary | ICD-10-CM | POA: Diagnosis not present

## 2024-02-22 DIAGNOSIS — R350 Frequency of micturition: Secondary | ICD-10-CM | POA: Diagnosis not present

## 2024-02-22 DIAGNOSIS — R5383 Other fatigue: Secondary | ICD-10-CM | POA: Diagnosis not present

## 2024-02-22 DIAGNOSIS — Z7189 Other specified counseling: Secondary | ICD-10-CM | POA: Diagnosis not present

## 2024-02-22 DIAGNOSIS — I10 Essential (primary) hypertension: Secondary | ICD-10-CM | POA: Diagnosis not present

## 2024-02-22 DIAGNOSIS — M545 Low back pain, unspecified: Secondary | ICD-10-CM | POA: Diagnosis not present

## 2024-02-22 DIAGNOSIS — Z79899 Other long term (current) drug therapy: Secondary | ICD-10-CM | POA: Diagnosis not present

## 2024-02-22 DIAGNOSIS — Z299 Encounter for prophylactic measures, unspecified: Secondary | ICD-10-CM | POA: Diagnosis not present

## 2024-02-24 ENCOUNTER — Other Ambulatory Visit (INDEPENDENT_AMBULATORY_CARE_PROVIDER_SITE_OTHER): Payer: Self-pay

## 2024-02-24 ENCOUNTER — Encounter: Payer: Self-pay | Admitting: Orthopedic Surgery

## 2024-02-24 ENCOUNTER — Ambulatory Visit (INDEPENDENT_AMBULATORY_CARE_PROVIDER_SITE_OTHER): Admitting: Orthopedic Surgery

## 2024-02-24 DIAGNOSIS — M5432 Sciatica, left side: Secondary | ICD-10-CM

## 2024-02-24 DIAGNOSIS — M25562 Pain in left knee: Secondary | ICD-10-CM | POA: Diagnosis not present

## 2024-02-24 DIAGNOSIS — M1712 Unilateral primary osteoarthritis, left knee: Secondary | ICD-10-CM

## 2024-02-24 DIAGNOSIS — G8929 Other chronic pain: Secondary | ICD-10-CM | POA: Diagnosis not present

## 2024-02-24 MED ORDER — PREGABALIN 75 MG PO CAPS: 75.0000 mg | ORAL_CAPSULE | Freq: Two times a day (BID) | ORAL | 0 refills | Status: AC

## 2024-02-24 NOTE — Progress Notes (Signed)
  Intake history:  Chief Complaint  Patient presents with   Knee Pain    L, states pain goes down the back of the thigh to the knee    Back Pain     There were no vitals taken for this visit. There is no height or weight on file to calculate BMI.  Pharmacy? ______________________________________  WHAT ARE WE SEEING YOU FOR TODAY?   back - left lower back  Radiation?: yes - .   Loss of bowel/urine control?  no, left upper leg(s)  How long has this bothered you? (DOI?DOS?WS?)  approximately 2-3 week(s) ago  Was there an injury? No  Anticoag.  No  Diabetes No  Heart disease No  Hypertension Yes  SMOKING HX No  Kidney disease No  Any ALLERGIES ______________________________________________   Treatment:  Have you taken:  Tylenol  Yes  Advil  No  Had PT No  Had injection No  Other  _________________________

## 2024-02-24 NOTE — Progress Notes (Signed)
 Chief Complaint  Patient presents with   Knee Pain    L, states pain goes down the back of the thigh to the knee    Back Pain    This is a new problem  The patient is 53 years old she complains of pain from her lower back and left hip which radiates down across her thigh onto the medial side of her knee  She also has pain on the medial side of the knee  Red flags by history are negative  Review of systems no weakness in the left lower extremity no giving way  Medical history has been reviewed, pertinent findings include history of lower back pain, MRI in 2023   Physical Exam Vitals and nursing note reviewed.  Constitutional:      Appearance: Normal appearance.  HENT:     Head: Normocephalic and atraumatic.  Eyes:     General: No scleral icterus.       Right eye: No discharge.        Left eye: No discharge.     Extraocular Movements: Extraocular movements intact.     Conjunctiva/sclera: Conjunctivae normal.     Pupils: Pupils are equal, round, and reactive to light.  Cardiovascular:     Rate and Rhythm: Normal rate.     Pulses: Normal pulses.  Musculoskeletal:     Lumbar back: Tenderness present. No swelling, edema, deformity, signs of trauma, lacerations or spasms. Negative right straight leg raise test and negative left straight leg raise test. No scoliosis.     Left knee: Crepitus present. No swelling, deformity, effusion, erythema, ecchymosis or lacerations. Tenderness present over the medial joint line. No LCL laxity, MCL laxity, ACL laxity or PCL laxity. Skin:    General: Skin is warm and dry.     Capillary Refill: Capillary refill takes less than 2 seconds.  Neurological:     General: No focal deficit present.     Mental Status: [name] alert and oriented to person, place, and time.  Psychiatric:        Mood and Affect: Mood normal.        Behavior: Behavior normal.        Thought Content: Thought content normal.        Judgment: Judgment normal.    DG Knee  AP/LAT W/Sunrise Left Result Date: 02/24/2024 X-ray report Chief complaint left leg and knee pain Images 3 views Reading: Left knee in slight varus Joint space narrowing medial compartment moderate patellofemoral joint none lateral compartment none secondary bone changes osteophytes medially femur tibia sclerotic line seen medial side of the tibia may indicate prior surgery or bone infarct Impression: Medial compartment arthritis moderate    IMPRESSION: Lumbar and lower thoracic spondylosis, as outlined and having slightly progressed at multiple levels since the prior MRI of 06/23/2019. No significant spinal canal or foraminal stenosis. No more than mild disc degeneration.   Multilevel facet arthrosis, as detailed and greatest on the left at L3-L4, on the left at L4-L5 and bilaterally at L5-S1.   Trace grade 1 anterolisthesis at L4-L5, new from the prior MRI.     Electronically Signed   By: Rockey Childs D.O.   On: 09/23/2021 09:02    Encounter Diagnoses  Name Primary?   Chronic pain of left knee    Sciatica, left side Yes     This is most likely sciatica or radicular pain The knee is relatively asymptomatic except for tenderness over the medial joint line  We  discussed possible treatments to include stopping meloxicam  starting prednisone.  But she says this will make her gain weight  She has done well with Lyrica  in the past so we settled on that  Meds ordered this encounter  Medications   pregabalin  (LYRICA ) 75 MG capsule    Sig: Take 1 capsule (75 mg total) by mouth 2 (two) times daily.    Dispense:  60 capsule    Refill:  0

## 2024-03-01 DIAGNOSIS — Z299 Encounter for prophylactic measures, unspecified: Secondary | ICD-10-CM | POA: Diagnosis not present

## 2024-03-01 DIAGNOSIS — J9611 Chronic respiratory failure with hypoxia: Secondary | ICD-10-CM | POA: Diagnosis not present

## 2024-03-01 DIAGNOSIS — I10 Essential (primary) hypertension: Secondary | ICD-10-CM | POA: Diagnosis not present

## 2024-03-01 DIAGNOSIS — Z2821 Immunization not carried out because of patient refusal: Secondary | ICD-10-CM | POA: Diagnosis not present

## 2024-03-27 DIAGNOSIS — M79602 Pain in left arm: Secondary | ICD-10-CM | POA: Diagnosis not present

## 2024-03-27 DIAGNOSIS — R519 Headache, unspecified: Secondary | ICD-10-CM | POA: Diagnosis not present

## 2024-03-27 DIAGNOSIS — Z880 Allergy status to penicillin: Secondary | ICD-10-CM | POA: Diagnosis not present

## 2024-03-27 DIAGNOSIS — Z79899 Other long term (current) drug therapy: Secondary | ICD-10-CM | POA: Diagnosis not present

## 2024-03-29 DIAGNOSIS — D219 Benign neoplasm of connective and other soft tissue, unspecified: Secondary | ICD-10-CM | POA: Diagnosis not present

## 2024-05-07 ENCOUNTER — Other Ambulatory Visit: Payer: Self-pay | Admitting: Orthopedic Surgery

## 2024-05-07 DIAGNOSIS — M25551 Pain in right hip: Secondary | ICD-10-CM

## 2024-05-07 DIAGNOSIS — M1611 Unilateral primary osteoarthritis, right hip: Secondary | ICD-10-CM

## 2024-05-07 DIAGNOSIS — M1711 Unilateral primary osteoarthritis, right knee: Secondary | ICD-10-CM
